# Patient Record
Sex: Female | Born: 1935 | Race: White | Hispanic: No | Smoking: Never smoker
Health system: Southern US, Community
[De-identification: ages and names within clinical notes are randomized; demographics above are authoritative.]

## PROBLEM LIST (undated history)

## (undated) DIAGNOSIS — N184 Chronic kidney disease, stage 4 (severe): Secondary | ICD-10-CM

## (undated) DIAGNOSIS — Z7901 Long term (current) use of anticoagulants: Secondary | ICD-10-CM

## (undated) DIAGNOSIS — I10 Essential (primary) hypertension: Secondary | ICD-10-CM

## (undated) DIAGNOSIS — R9430 Abnormal result of cardiovascular function study, unspecified: Secondary | ICD-10-CM

## (undated) DIAGNOSIS — G629 Polyneuropathy, unspecified: Secondary | ICD-10-CM

## (undated) DIAGNOSIS — G459 Transient cerebral ischemic attack, unspecified: Secondary | ICD-10-CM

## (undated) DIAGNOSIS — I639 Cerebral infarction, unspecified: Secondary | ICD-10-CM

## (undated) DIAGNOSIS — E785 Hyperlipidemia, unspecified: Secondary | ICD-10-CM

## (undated) DIAGNOSIS — Z794 Long term (current) use of insulin: Secondary | ICD-10-CM

## (undated) DIAGNOSIS — N200 Calculus of kidney: Secondary | ICD-10-CM

## (undated) DIAGNOSIS — IMO0001 Reserved for inherently not codable concepts without codable children: Secondary | ICD-10-CM

## (undated) DIAGNOSIS — E119 Type 2 diabetes mellitus without complications: Secondary | ICD-10-CM

## (undated) DIAGNOSIS — M1990 Unspecified osteoarthritis, unspecified site: Secondary | ICD-10-CM

## (undated) DIAGNOSIS — T4145XA Adverse effect of unspecified anesthetic, initial encounter: Secondary | ICD-10-CM

## (undated) DIAGNOSIS — R42 Dizziness and giddiness: Secondary | ICD-10-CM

## (undated) DIAGNOSIS — J449 Chronic obstructive pulmonary disease, unspecified: Secondary | ICD-10-CM

## (undated) DIAGNOSIS — N189 Chronic kidney disease, unspecified: Secondary | ICD-10-CM

## (undated) DIAGNOSIS — S98139A Complete traumatic amputation of one unspecified lesser toe, initial encounter: Secondary | ICD-10-CM

## (undated) DIAGNOSIS — T8859XA Other complications of anesthesia, initial encounter: Secondary | ICD-10-CM

## (undated) DIAGNOSIS — I509 Heart failure, unspecified: Secondary | ICD-10-CM

## (undated) DIAGNOSIS — I480 Paroxysmal atrial fibrillation: Secondary | ICD-10-CM

## (undated) DIAGNOSIS — K219 Gastro-esophageal reflux disease without esophagitis: Secondary | ICD-10-CM

## (undated) HISTORY — DX: Hyperlipidemia, unspecified: E78.5

## (undated) HISTORY — PX: TONSILLECTOMY: SUR1361

## (undated) HISTORY — DX: Complete traumatic amputation of one unspecified lesser toe, initial encounter: S98.139A

## (undated) HISTORY — DX: Long term (current) use of anticoagulants: Z79.01

## (undated) HISTORY — DX: Dizziness and giddiness: R42

## (undated) HISTORY — DX: Essential (primary) hypertension: I10

## (undated) HISTORY — DX: Polyneuropathy, unspecified: G62.9

## (undated) HISTORY — PX: CHOLECYSTECTOMY: SHX55

## (undated) HISTORY — PX: APPENDECTOMY: SHX54

## (undated) HISTORY — DX: Abnormal result of cardiovascular function study, unspecified: R94.30

## (undated) HISTORY — PX: CATARACT EXTRACTION: SUR2

## (undated) HISTORY — DX: Paroxysmal atrial fibrillation: I48.0

## (undated) HISTORY — DX: Cerebral infarction, unspecified: I63.9

---

## 2004-01-08 ENCOUNTER — Ambulatory Visit: Payer: Self-pay | Admitting: Infectious Diseases

## 2004-01-13 ENCOUNTER — Ambulatory Visit (HOSPITAL_COMMUNITY): Admission: RE | Admit: 2004-01-13 | Discharge: 2004-01-13 | Payer: Self-pay | Admitting: Infectious Diseases

## 2004-01-21 ENCOUNTER — Ambulatory Visit: Payer: Self-pay | Admitting: Infectious Diseases

## 2006-03-06 DIAGNOSIS — G459 Transient cerebral ischemic attack, unspecified: Secondary | ICD-10-CM

## 2006-03-06 HISTORY — DX: Transient cerebral ischemic attack, unspecified: G45.9

## 2007-03-07 HISTORY — PX: TOE AMPUTATION: SHX809

## 2007-08-09 ENCOUNTER — Encounter: Payer: Self-pay | Admitting: Cardiology

## 2007-09-12 ENCOUNTER — Ambulatory Visit: Payer: Self-pay | Admitting: Cardiology

## 2007-09-16 ENCOUNTER — Ambulatory Visit: Payer: Self-pay | Admitting: Cardiology

## 2007-09-19 ENCOUNTER — Ambulatory Visit: Payer: Self-pay | Admitting: Cardiology

## 2007-10-15 ENCOUNTER — Ambulatory Visit: Payer: Self-pay | Admitting: Cardiology

## 2007-10-18 ENCOUNTER — Ambulatory Visit: Payer: Self-pay | Admitting: Cardiology

## 2007-10-22 ENCOUNTER — Ambulatory Visit: Payer: Self-pay | Admitting: Cardiology

## 2007-10-29 ENCOUNTER — Ambulatory Visit: Payer: Self-pay | Admitting: Cardiology

## 2007-11-05 ENCOUNTER — Ambulatory Visit: Payer: Self-pay | Admitting: Cardiology

## 2007-11-21 ENCOUNTER — Ambulatory Visit: Payer: Self-pay | Admitting: Cardiology

## 2007-12-03 ENCOUNTER — Ambulatory Visit: Payer: Self-pay | Admitting: Cardiology

## 2007-12-31 ENCOUNTER — Ambulatory Visit: Payer: Self-pay | Admitting: Cardiology

## 2008-01-10 ENCOUNTER — Ambulatory Visit: Payer: Self-pay | Admitting: Cardiology

## 2008-01-28 ENCOUNTER — Ambulatory Visit: Payer: Self-pay | Admitting: Cardiology

## 2008-02-25 ENCOUNTER — Ambulatory Visit: Payer: Self-pay | Admitting: Cardiology

## 2008-03-24 ENCOUNTER — Ambulatory Visit: Payer: Self-pay | Admitting: Cardiology

## 2008-04-17 ENCOUNTER — Ambulatory Visit: Payer: Self-pay | Admitting: Cardiology

## 2008-05-08 ENCOUNTER — Ambulatory Visit: Payer: Self-pay | Admitting: Cardiology

## 2008-06-09 ENCOUNTER — Ambulatory Visit: Payer: Self-pay | Admitting: Cardiology

## 2008-06-29 ENCOUNTER — Encounter: Payer: Self-pay | Admitting: Cardiology

## 2008-07-07 ENCOUNTER — Ambulatory Visit: Payer: Self-pay | Admitting: Cardiology

## 2008-07-21 ENCOUNTER — Ambulatory Visit: Payer: Self-pay | Admitting: Cardiology

## 2008-08-04 ENCOUNTER — Ambulatory Visit: Payer: Self-pay | Admitting: Cardiology

## 2008-08-18 ENCOUNTER — Ambulatory Visit: Payer: Self-pay | Admitting: Cardiology

## 2008-09-08 ENCOUNTER — Ambulatory Visit: Payer: Self-pay | Admitting: Cardiology

## 2008-10-02 ENCOUNTER — Ambulatory Visit: Payer: Self-pay | Admitting: Cardiology

## 2008-10-19 ENCOUNTER — Encounter: Payer: Self-pay | Admitting: *Deleted

## 2008-10-29 ENCOUNTER — Encounter: Payer: Self-pay | Admitting: Cardiology

## 2008-10-30 ENCOUNTER — Ambulatory Visit: Payer: Self-pay | Admitting: Cardiology

## 2008-10-30 LAB — CONVERTED CEMR LAB
POC INR: 3
Prothrombin Time: 20.8 s

## 2008-11-11 ENCOUNTER — Encounter: Payer: Self-pay | Admitting: Cardiology

## 2008-11-11 ENCOUNTER — Telehealth (INDEPENDENT_AMBULATORY_CARE_PROVIDER_SITE_OTHER): Payer: Self-pay | Admitting: *Deleted

## 2008-11-27 ENCOUNTER — Ambulatory Visit: Payer: Self-pay | Admitting: Cardiology

## 2008-11-27 LAB — CONVERTED CEMR LAB: POC INR: 3.4

## 2008-12-25 ENCOUNTER — Ambulatory Visit: Payer: Self-pay | Admitting: Cardiology

## 2008-12-25 LAB — CONVERTED CEMR LAB: POC INR: 3.2

## 2009-01-06 ENCOUNTER — Encounter: Payer: Self-pay | Admitting: Cardiology

## 2009-01-08 ENCOUNTER — Encounter: Payer: Self-pay | Admitting: Cardiology

## 2009-01-22 ENCOUNTER — Ambulatory Visit: Payer: Self-pay | Admitting: Cardiology

## 2009-01-22 LAB — CONVERTED CEMR LAB: POC INR: 2.8

## 2009-02-21 ENCOUNTER — Encounter: Payer: Self-pay | Admitting: Cardiology

## 2009-02-23 ENCOUNTER — Ambulatory Visit: Payer: Self-pay | Admitting: Cardiology

## 2009-02-23 LAB — CONVERTED CEMR LAB: POC INR: 3.3

## 2009-03-23 ENCOUNTER — Ambulatory Visit: Payer: Self-pay | Admitting: Cardiology

## 2009-03-23 LAB — CONVERTED CEMR LAB: POC INR: 2.2

## 2009-04-20 ENCOUNTER — Ambulatory Visit: Payer: Self-pay | Admitting: Cardiology

## 2009-04-20 LAB — CONVERTED CEMR LAB: POC INR: 2.9

## 2009-05-18 ENCOUNTER — Ambulatory Visit: Payer: Self-pay | Admitting: Cardiology

## 2009-05-18 LAB — CONVERTED CEMR LAB: POC INR: 2.6

## 2009-06-15 ENCOUNTER — Ambulatory Visit: Payer: Self-pay | Admitting: Cardiology

## 2009-06-15 LAB — CONVERTED CEMR LAB: POC INR: 2.4

## 2009-07-13 ENCOUNTER — Ambulatory Visit: Payer: Self-pay | Admitting: Cardiology

## 2009-07-13 LAB — CONVERTED CEMR LAB: POC INR: 2.6

## 2009-08-10 ENCOUNTER — Ambulatory Visit: Payer: Self-pay | Admitting: Cardiology

## 2009-08-10 LAB — CONVERTED CEMR LAB: POC INR: 2

## 2009-09-14 ENCOUNTER — Ambulatory Visit: Payer: Self-pay | Admitting: Cardiology

## 2009-09-14 LAB — CONVERTED CEMR LAB: POC INR: 1.5

## 2009-10-05 ENCOUNTER — Ambulatory Visit: Payer: Self-pay | Admitting: Cardiology

## 2009-10-05 LAB — CONVERTED CEMR LAB: POC INR: 2

## 2009-11-02 ENCOUNTER — Ambulatory Visit: Payer: Self-pay | Admitting: Cardiology

## 2009-11-02 LAB — CONVERTED CEMR LAB: POC INR: 2.8

## 2009-11-30 ENCOUNTER — Ambulatory Visit: Payer: Self-pay | Admitting: Cardiology

## 2009-11-30 LAB — CONVERTED CEMR LAB: POC INR: 2.3

## 2009-12-28 ENCOUNTER — Ambulatory Visit: Payer: Self-pay | Admitting: Cardiology

## 2009-12-28 LAB — CONVERTED CEMR LAB: POC INR: 1.8

## 2010-01-25 ENCOUNTER — Ambulatory Visit: Payer: Self-pay | Admitting: Cardiology

## 2010-01-25 LAB — CONVERTED CEMR LAB: POC INR: 2.4

## 2010-02-07 ENCOUNTER — Ambulatory Visit: Payer: Self-pay | Admitting: Cardiology

## 2010-02-22 ENCOUNTER — Ambulatory Visit: Payer: Self-pay | Admitting: Cardiology

## 2010-02-22 LAB — CONVERTED CEMR LAB: POC INR: 3.3

## 2010-03-22 ENCOUNTER — Ambulatory Visit: Admit: 2010-03-22 | Payer: Self-pay

## 2010-04-01 ENCOUNTER — Ambulatory Visit: Admit: 2010-04-01 | Payer: Self-pay

## 2010-04-05 NOTE — Medication Information (Signed)
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 no    Recent/future dental: no  Any missed doses?: no       Is patient compliant with meds? yes       Allergies: No Known Drug Allergies  Anticoagulation Management History:      The patient is taking warfarin and comes in today for a routine follow up visit.  Positive risk factors for bleeding include an age of 74 years or older, history of CVA/TIA, and presence of serious comorbidities.  The bleeding index is 'high risk'.  Positive CHADS2 values include History of HTN, History of Diabetes, and Prior Stroke/CVA/TIA.  Negative CHADS2 values include Age > 74 years old.  The start date was 10/16/2007.  Anticoagulation responsible provider: Andee Lineman MD, Michelle Piper.  INR POC: 2.6.  Cuvette Lot#: [id number].  Exp: 07/11.    Anticoagulation Management Assessment/Plan:      The patient's current anticoagulation dose is Warfarin sodium 5 mg tabs: Use as directed by Anticoagulation Clinic..  The target INR is 2 - 3.  The next INR is due 06/15/2009.  Anticoagulation instructions were given to patient.  Results were reviewed/authorized by Vashti Hey, RN.  She was notified by Vashti Hey RN.         Prior Anticoagulation Instructions: INR 2.9 Continue coumadin 2.5mg  once daily except 5mg  on S,T,Th  Current Anticoagulation Instructions: INR 2.6 Continue coumadin 2.5mg  once daily except 5mg  on S, T,Th

## 2010-04-05 NOTE — Medication Information (Signed)
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 no    Recent/future dental: no  Any missed doses?: no       Is patient compliant with meds? yes       Allergies: No Known Drug Allergies  Anticoagulation Management History:      The patient is taking warfarin and comes in today for a routine follow up visit.  Positive risk factors for bleeding include an age of 40 years or older, history of CVA/TIA, and presence of serious comorbidities.  The bleeding index is 'high risk'.  Positive CHADS2 values include History of HTN, History of Diabetes, and Prior Stroke/CVA/TIA.  Negative CHADS2 values include Age > 60 years old.  The start date was 10/16/2007.  Anticoagulation responsible Terrie Grajales: Andee Lineman MD, Michelle Piper.  INR POC: 1.8.  Cuvette Lot#: [id number].  Exp: 07/11.    Anticoagulation Management Assessment/Plan:      The patient's current anticoagulation dose is Warfarin sodium 5 mg tabs: Use as directed by Anticoagulation Clinic..  The target INR is 2 - 3.  The next INR is due 01/25/2010.  Anticoagulation instructions were given to patient.  Results were reviewed/authorized by Vashti Hey, RN.  She was notified by Vashti Hey RN.         Prior Anticoagulation Instructions: INR 2.3 Continue coumadin 5mg  once daily except 2.5mg  on M,W,F  Current Anticoagulation Instructions: INR 1.8 Take coumadin 7.5mg  tonight then resume 5mg  once daily except 2.5mg  on M,W,F

## 2010-04-05 NOTE — Medication Information (Signed)
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 no    Recent/future dental: no  Any missed doses?: no       Is patient compliant with meds? yes       Allergies: No Known Drug Allergies  Anticoagulation Management History:      The patient is taking warfarin and comes in today for a routine follow up visit.  Positive risk factors for bleeding include an age of 74 years or older, history of CVA/TIA, and presence of serious comorbidities.  The bleeding index is 'high risk'.  Positive CHADS2 values include History of HTN, History of Diabetes, and Prior Stroke/CVA/TIA.  Negative CHADS2 values include Age > 48 years old.  The start date was 10/16/2007.  Anticoagulation responsible provider: Diona Browner MD, Remi Deter.  INR POC: 2.4.  Cuvette Lot#: [id number].  Exp: 07/11.    Anticoagulation Management Assessment/Plan:      The patient's current anticoagulation dose is Warfarin sodium 5 mg tabs: Use as directed by Anticoagulation Clinic..  The target INR is 2 - 3.  The next INR is due 02/22/2010.  Anticoagulation instructions were given to patient.  Results were reviewed/authorized by Vashti Hey, RN.  She was notified by Vashti Hey RN.         Prior Anticoagulation Instructions: INR 1.8 Take coumadin 7.5mg  tonight then resume 5mg  once daily except 2.5mg  on M,W,F  Current Anticoagulation Instructions: INR 2.4 Continue coumadin 5mg  once daily except 2.5mg  on M,W,F

## 2010-04-05 NOTE — Medication Information (Signed)
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 no    Recent/future dental: no  Any missed doses?: no       Is patient compliant with meds? yes       Allergies: No Known Drug Allergies  Anticoagulation Management History:      The patient is taking warfarin and comes in today for a routine follow up visit.  Positive risk factors for bleeding include an age of 74 years or older, history of CVA/TIA, and presence of serious comorbidities.  The bleeding index is 'high risk'.  Positive CHADS2 values include History of HTN, History of Diabetes, and Prior Stroke/CVA/TIA.  Negative CHADS2 values include Age > 74 years old.  The start date was 10/16/2007.  Anticoagulation responsible provider: Andee Lineman MD, Michelle Piper.  INR POC: 1.5.  Cuvette Lot#: [id number].  Exp: 07/11.    Anticoagulation Management Assessment/Plan:      The patient's current anticoagulation dose is Warfarin sodium 5 mg tabs: Use as directed by Anticoagulation Clinic..  The target INR is 2 - 3.  The next INR is due 10/05/2009.  Anticoagulation instructions were given to patient.  Results were reviewed/authorized by Vashti Hey, RN.  She was notified by Vashti Hey RN.         Prior Anticoagulation Instructions: INR 2.0 Continue coumadin 2.5mg  once daily except 5mg  on S,T,Th  Current Anticoagulation Instructions: INR 1.5 Take coumadin 7.5mg  tonight, 5mg  tomorrow night then resume 2.5mg  once daily except 5mg  on S,T,Th

## 2010-04-05 NOTE — Medication Information (Signed)
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 no    Recent/future dental: no  Any missed doses?: no       Is patient compliant with meds? yes       Allergies: No Known Drug Allergies  Anticoagulation Management History:      The patient is taking warfarin and comes in today for a routine follow up visit.  Positive risk factors for bleeding include an age of 55 years or older, history of CVA/TIA, and presence of serious comorbidities.  The bleeding index is 'high risk'.  Positive CHADS2 values include History of HTN, History of Diabetes, and Prior Stroke/CVA/TIA.  Negative CHADS2 values include Age > 48 years old.  The start date was 10/16/2007.  Anticoagulation responsible provider: Andee Lineman MD, Michelle Piper.  INR POC: 2.2.  Cuvette Lot#: [id number].  Exp: 07/11.    Anticoagulation Management Assessment/Plan:      The patient's current anticoagulation dose is Warfarin sodium 5 mg tabs: Use as directed by Anticoagulation Clinic..  The target INR is 2 - 3.  The next INR is due 04/20/2009.  Anticoagulation instructions were given to patient.  Results were reviewed/authorized by Vashti Hey, RN.  She was notified by Vashti Hey RN.         Prior Anticoagulation Instructions: INR 3.3 Take coumadin2.5mg  tonight then resume 2.5mg  once daily except 5mg  on S,T,Th  Current Anticoagulation Instructions: INR 2.2 Continue coumadin 2.5mg  once daily except 5mg  on S,T,Th

## 2010-04-05 NOTE — Medication Information (Signed)
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 no    Recent/future dental: no  Any missed doses?: no       Is patient compliant with meds? yes       Allergies: No Known Drug Allergies  Anticoagulation Management History:      The patient is taking warfarin and comes in today for a routine follow up visit.  Positive risk factors for bleeding include an age of 74 years or older, history of CVA/TIA, and presence of serious comorbidities.  The bleeding index is 'high risk'.  Positive CHADS2 values include History of HTN, History of Diabetes, and Prior Stroke/CVA/TIA.  Negative CHADS2 values include Age > 34 years old.  The start date was 10/16/2007.  Anticoagulation responsible provider: Diona Browner MD, Remi Deter.  INR POC: 2.0.  Cuvette Lot#: [id number].  Exp: 07/11.    Anticoagulation Management Assessment/Plan:      The patient's current anticoagulation dose is Warfarin sodium 5 mg tabs: Use as directed by Anticoagulation Clinic..  The target INR is 2 - 3.  The next INR is due 09/14/2009.  Anticoagulation instructions were given to patient.  Results were reviewed/authorized by Vashti Hey, RN.  She was notified by Vashti Hey RN.         Prior Anticoagulation Instructions: INR 2.6 Continue coumadin 2.5mg  once daily except 5mg  on S,T,Th  Current Anticoagulation Instructions: INR 2.0 Continue coumadin 2.5mg  once daily except 5mg  on S,T,Th

## 2010-04-05 NOTE — Medication Information (Signed)
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 no    Recent/future dental: no  Any missed doses?: no       Is patient compliant with meds? yes       Allergies: No Known Drug Allergies  Anticoagulation Management History:      The patient is taking warfarin and comes in today for a routine follow up visit.  Positive risk factors for bleeding include an age of 74 years or older, history of CVA/TIA, and presence of serious comorbidities.  The bleeding index is 'high risk'.  Positive CHADS2 values include History of HTN, History of Diabetes, and Prior Stroke/CVA/TIA.  Negative CHADS2 values include Age > 57 years old.  The start date was 10/16/2007.  Anticoagulation responsible provider: Andee Lineman MD, Michelle Piper.  INR POC: 2.8.  Cuvette Lot#: [id number].  Exp: 07/11.    Anticoagulation Management Assessment/Plan:      The patient's current anticoagulation dose is Warfarin sodium 5 mg tabs: Use as directed by Anticoagulation Clinic..  The target INR is 2 - 3.  The next INR is due 11/30/2009.  Anticoagulation instructions were given to patient.  Results were reviewed/authorized by Vashti Hey, RN.  She was notified by Vashti Hey RN.         Prior Anticoagulation Instructions: INR 2.0 Increase coumadin to 5mg  once daily except 2.5mg  on M,W,F  Current Anticoagulation Instructions: INR 2.8 Continue coumadin 5mg  once daily except 2.5mg  on M,W,F

## 2010-04-05 NOTE — Medication Information (Signed)
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 no    Recent/future dental: no  Any missed doses?: no       Is patient compliant with meds? yes       Allergies: No Known Drug Allergies  Anticoagulation Management History:      The patient is taking warfarin and comes in today for a routine follow up visit.  Positive risk factors for bleeding include an age of 51 years or older, history of CVA/TIA, and presence of serious comorbidities.  The bleeding index is 'high risk'.  Positive CHADS2 values include History of HTN, History of Diabetes, and Prior Stroke/CVA/TIA.  Negative CHADS2 values include Age > 20 years old.  The start date was 10/16/2007.  Anticoagulation responsible Dominick Morella: Andee Lineman MD, Michelle Piper.  INR POC: 2.3.  Cuvette Lot#: [id number].  Exp: 07/11.    Anticoagulation Management Assessment/Plan:      The patient's current anticoagulation dose is Warfarin sodium 5 mg tabs: Use as directed by Anticoagulation Clinic..  The target INR is 2 - 3.  The next INR is due 12/28/2009.  Anticoagulation instructions were given to patient.  Results were reviewed/authorized by Vashti Hey, RN.  She was notified by Vashti Hey RN.         Prior Anticoagulation Instructions: INR 2.8 Continue coumadin 5mg  once daily except 2.5mg  on M,W,F  Current Anticoagulation Instructions: INR 2.3 Continue coumadin 5mg  once daily except 2.5mg  on M,W,F

## 2010-04-05 NOTE — Medication Information (Signed)
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 Site: [facility] Care Clinic INR POC 2.6  Dietary changes: no    Health status changes: no    Bleeding/hemorrhagic complications: no    Recent/future hospitalizations: no    Any changes in medication regimen? no    Recent/future dental: no  Any missed doses?: no       Is patient compliant with meds? yes       Allergies: No Known Drug Allergies  Anticoagulation Management History:      The patient is taking warfarin and comes in today for a routine follow up visit.  Positive risk factors for bleeding include an age of 74 years or older, history of CVA/TIA, and presence of serious comorbidities.  The bleeding index is 'high risk'.  Positive CHADS2 values include History of HTN, History of Diabetes, and Prior Stroke/CVA/TIA.  Negative CHADS2 values include Age > 78 years old.  The start date was 10/16/2007.  Anticoagulation responsible provider: Diona Browner MD, Remi Deter.  INR POC: 2.6.  Cuvette Lot#: [id number].  Exp: 07/11.    Anticoagulation Management Assessment/Plan:      The patient's current anticoagulation dose is Warfarin sodium 5 mg tabs: Use as directed by Anticoagulation Clinic..  The target INR is 2 - 3.  The next INR is due 08/10/2009.  Anticoagulation instructions were given to patient.  Results were reviewed/authorized by Vashti Hey, RN.  She was notified by Vashti Hey RN.         Prior Anticoagulation Instructions: INR 2.4 Continue coumadin 2.5mg  once daily except 5mg  on S,T,Th  Current Anticoagulation Instructions: INR 2.6 Continue coumadin 2.5mg  once daily except 5mg  on S,T,Th

## 2010-04-05 NOTE — Medication Information (Signed)
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 no    Recent/future dental: no  Any missed doses?: no       Is patient compliant with meds? yes       Allergies: No Known Drug Allergies  Anticoagulation Management History:      The patient is taking warfarin and comes in today for a routine follow up visit.  Positive risk factors for bleeding include an age of 74 years or older, history of CVA/TIA, and presence of serious comorbidities.  The bleeding index is 'high risk'.  Positive CHADS2 values include History of HTN, History of Diabetes, and Prior Stroke/CVA/TIA.  Negative CHADS2 values include Age > 66 years old.  The start date was 10/16/2007.  Anticoagulation responsible provider: Andee Lineman MD, Michelle Piper.  INR POC: 2.0.  Cuvette Lot#: [id number].  Exp: 07/11.    Anticoagulation Management Assessment/Plan:      The patient's current anticoagulation dose is Warfarin sodium 5 mg tabs: Use as directed by Anticoagulation Clinic..  The target INR is 2 - 3.  The next INR is due 11/02/2009.  Anticoagulation instructions were given to patient.  Results were reviewed/authorized by Vashti Hey, RN.  She was notified by Vashti Hey RN.         Prior Anticoagulation Instructions: INR 1.5 Take coumadin 7.5mg  tonight, 5mg  tomorrow night then resume 2.5mg  once daily except 5mg  on S,T,Th   Current Anticoagulation Instructions: INR 2.0 Increase coumadin to 5mg  once daily except 2.5mg  on M,W,F

## 2010-04-05 NOTE — Medication Information (Signed)
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 Care Clinic INR POC 2.9  Dietary changes: no    Health status changes: no    Bleeding/hemorrhagic complications: no    Recent/future hospitalizations: no    Any changes in medication regimen? no    Recent/future dental: no  Any missed doses?: no       Is patient compliant with meds? yes       Allergies: No Known Drug Allergies  Anticoagulation Management History:      The patient is taking warfarin and comes in today for a routine follow up visit.  Positive risk factors for bleeding include an age of 74 years or older, history of CVA/TIA, and presence of serious comorbidities.  The bleeding index is 'high risk'.  Positive CHADS2 values include History of HTN, History of Diabetes, and Prior Stroke/CVA/TIA.  Negative CHADS2 values include Age > 74 years old.  The start date was 10/16/2007.  Anticoagulation responsible provider: Antoine Poche MD, Fayrene Fearing.  INR POC: 2.9.  Cuvette Lot#: [id number].  Exp: 07/11.    Anticoagulation Management Assessment/Plan:      The patient's current anticoagulation dose is Warfarin sodium 5 mg tabs: Use as directed by Anticoagulation Clinic..  The target INR is 2 - 3.  The next INR is due 05/18/2009.  Anticoagulation instructions were given to patient.  Results were reviewed/authorized by Vashti Hey, RN.  She was notified by Vashti Hey RN.         Prior Anticoagulation Instructions: INR 2.2 Continue coumadin 2.5mg  once daily except 5mg  on S,T,Th  Current Anticoagulation Instructions: INR 2.9 Continue coumadin 2.5mg  once daily except 5mg  on S,T,Th

## 2010-04-05 NOTE — Assessment & Plan Note (Signed)
Summary: 1 yr fu per dec reminder-srs   Visit Type:  Follow-up Primary Provider:  Fara Chute, MD  CC:  atrial fibrillation.  History of Present Illness: The patient is seen for followup of atrial fibrillation.  I saw her last December, 2010.  She's done well.  She had a CVA in 2009 with dysarthria.  This has improved completely.  She is on Coumadin and her levels followed very carefully.  She does not have palpitations.  She is on meds for rate control.  She has normal LV function.  She has no symptomatic palpitations.  No chest pain or shortness of breath.  Preventive Screening-Counseling & Management  Alcohol-Tobacco     Smoking Status: never  Current Medications (verified): 1)  Diltiazem Hcl Coated Beads 120 Mg Xr24h-Cap (Diltiazem Hcl Coated Beads) .... Take One Tab By Mouth Every Evening 2)  Warfarin Sodium 5 Mg Tabs (Warfarin Sodium) .... Use As Directed By Anticoagulation Clinic. 3)  Diltiazem Hcl Er Beads 240 Mg Xr24h-Cap (Diltiazem Hcl Er Beads) .... Take One Tab Every A.m. 4)  Glyburide 5 Mg Tabs (Glyburide) .... Take 2 By Mouth in Am and 1 By Mouth in Pm 5)  Simvastatin 20 Mg Tabs (Simvastatin) .... Take 1 Tablet At Bedtime 6)  Metformin Hcl 500 Mg Tabs (Metformin Hcl) .Marland Kitchen.. 1 Tab Every Morning and 1 1/2 Tab Every Evening 7)  Lisinopril-Hydrochlorothiazide 20-12.5 Mg Tabs (Lisinopril-Hydrochlorothiazide) .... 2 Tabs Every Morning  Allergies (verified): No Known Drug Allergies  Comments:  Nurse/Medical Assistant: The patient is currently on medications but does not know the name or dosage at this time. Instructed to contact our office with details. Will update medication list at that time.  Past History:  Past Medical History: Atrial fib...paroxysmal..Marland KitchenCHADS2 (4). coumadin Coumadin Rx EF  60-65%...echo.Marland KitchenMarland Kitchen7/2009 hypertension CVA....dysarthria...resolved...2009 chronic renal sufficiency dyslipidemia peripheral neuropathy diabetes mellitus Amputation two toes right  foot Cataract surgery  2011  Review of Systems       Patient denies fever, chills, headache, sweats, rash, , change in hearing, chest pain, cough, nausea vomiting, urinary symptoms. Cataract surgery went well and her vision is improved in the right.All of the systems are reviewed and are negative.  Vital Signs:  Patient profile:   76 year old female Height:      66 inches Weight:      175 pounds BMI:     28.35 Pulse rate:   85 / minute BP sitting:   156 / 73  (left arm) Cuff size:   regular  Vitals Entered By: Carlye Grippe (February 07, 2010 1:16 PM)  Nutrition Counseling: Patient's BMI is greater than 25 and therefore counseled on weight management options.  Physical Exam  General:  she looks great today. Eyes:  no xanthelasma. Neck:  no jugular distention. Lungs:  lungs are clear.  Respiratory effort is nonlabored. Heart:  cardiac exam reveals S1-S2.  No clicks or significant murmurs.  The rhythm is regular today. Abdomen:  abdomen soft. Extremities:  no peripheral edema. Psych:  patient is oriented to person time and place.  Affect is normal.   Impression & Recommendations:  Problem # 1:  DYSLIPIDEMIA (ICD-272.4)  Her updated medication list for this problem includes:    Simvastatin 20 Mg Tabs (Simvastatin) .Marland Kitchen... Take 1 tablet at bedtime The patient is on meds for her lipids.  This is followed by her primary physician.  Problem # 2:  CVA (ICD-434.91)  Her updated medication list for this problem includes:    Warfarin Sodium  5 Mg Tabs (Warfarin sodium) ..... Use as directed by anticoagulation clinic. With a history of CVA the patient will be kept on Coumadin or an equivalent indefinitely unless there is a contraindication.  I did describe Pradaxa to her.  Together we have agreed that she'll remain on Coumadin at this time.  Problem # 3:  HYPERTENSION (ICD-401.9)  Her updated medication list for this problem includes:    Diltiazem Hcl Coated Beads 120 Mg Xr24h-cap  (Diltiazem hcl coated beads) .Marland Kitchen... Take one tab by mouth every evening    Diltiazem Hcl Er Beads 240 Mg Xr24h-cap (Diltiazem hcl er beads) .Marland Kitchen... Take one tab every a.m.    Lisinopril-hydrochlorothiazide 20-12.5 Mg Tabs (Lisinopril-hydrochlorothiazide) .Marland Kitchen... 2 tabs every morning Blood pressure is adequately controlled today.  No change in therapy. Her updated medication list for this problem includes:    Diltiazem Hcl Coated Beads 120 Mg Xr24h-cap (Diltiazem hcl coated beads) .Marland Kitchen... Take one tab by mouth every evening    Diltiazem Hcl Er Beads 240 Mg Xr24h-cap (Diltiazem hcl er beads) .Marland Kitchen... Take one tab every a.m.    Lisinopril-hydrochlorothiazide 20-12.5 Mg Tabs (Lisinopril-hydrochlorothiazide) .Marland Kitchen... 2 tabs every morning Blood pressure is well controlled today.  No change in therapy.  Problem # 4:  ATRIAL FIBRILLATION, PAROXYSMAL (ICD-427.31)  Her updated medication list for this problem includes:    Warfarin Sodium 5 Mg Tabs (Warfarin sodium) ..... Use as directed by anticoagulation clinic.  Orders: EKG w/ Interpretation (93000) EKG done today and reviewed by me.  They're sinus rhythm today.  There is no change in the QRS. No change in therapy.  See her back in one year.  Patient Instructions: 1)  Your physician wants you to follow-up in: 1 year. You will receive a reminder letter in the mail one-two months in advance. If you don't receive a letter, please call our office to schedule the follow-up appointment. 2)  Your physician recommends that you continue on your current medications as directed. Please refer to the Current Medication list given to you today.

## 2010-04-05 NOTE — Medication Information (Signed)
Summary: ccr-lr  Anticoagulant Therapy  Managed by: Vashti Hey, RN PCP: Fara Chute, MD Supervising MD: Diona Browner MD, Remi Deter Indication 1: Atrial Fibrillation (ICD-427.31) Indication 2: CVA--stroke (ICD-436) [facility] [place] Site: [facility] Care Clinic INR POC 2.4  Dietary changes: no    Health status changes: yes       Details: Had recent fall   Went to ED  Xrays OK  Bleeding/hemorrhagic complications: no    Recent/future hospitalizations: no    Any changes in medication regimen? no    Recent/future dental: no  Any missed doses?: no       Is patient compliant with meds? yes       Allergies: No Known Drug Allergies  Anticoagulation Management History:      The patient is taking warfarin and comes in today for a routine follow up visit.  Positive risk factors for bleeding include an age of 74 years or older, history of CVA/TIA, and presence of serious comorbidities.  The bleeding index is 'high risk'.  Positive CHADS2 values include History of HTN, History of Diabetes, and Prior Stroke/CVA/TIA.  Negative CHADS2 values include Age > 74 years old.  The start date was 10/16/2007.  Anticoagulation responsible provider: Diona Browner MD, Remi Deter.  INR POC: 2.4.  Cuvette Lot#: [id number].  Exp: 07/11.    Anticoagulation Management Assessment/Plan:      The patient's current anticoagulation dose is Warfarin sodium 5 mg tabs: Use as directed by Anticoagulation Clinic..  The target INR is 2 - 3.  The next INR is due 07/13/2009.  Anticoagulation instructions were given to patient.  Results were reviewed/authorized by Vashti Hey, RN.  She was notified by Vashti Hey RN.         Prior Anticoagulation Instructions: INR 2.6 Continue coumadin 2.5mg  once daily except 5mg  on S, T,Th  Current Anticoagulation Instructions: INR 2.4 Continue coumadin 2.5mg  once daily except 5mg  on S,T,Th

## 2010-04-07 NOTE — Medication Information (Signed)
Summary: ccr-lr  Anticoagulant Therapy  Managed by: Vashti Hey, RN PCP: Fara Chute, MD Supervising MD: Andee Lineman MD, Michelle Piper Indication 1: Atrial Fibrillation (ICD-427.31) Indication 2: CVA--stroke (ICD-436) Lab [facility] [place] Site: [facility] of Care Clinic INR POC 3.3  Dietary changes: no    Health status changes: no    Bleeding/hemorrhagic complications: no    Recent/future hospitalizations: no    Any changes in medication regimen? no    Recent/future dental: no  Any missed doses?: no       Is patient compliant with meds? yes       Allergies: No Known Drug Allergies  Anticoagulation Management History:      The patient is taking warfarin and comes in today for a routine follow up visit.  Positive risk factors for bleeding include an age of 4 years or older, history of CVA/TIA, and presence of serious comorbidities.  The bleeding index is 'high risk'.  Positive CHADS2 values include History of HTN, History of Diabetes, and Prior Stroke/CVA/TIA.  Negative CHADS2 values include Age > 54 years old.  The start date was 10/16/2007.  Anticoagulation responsible Rakesh Dutko: Andee Lineman MD, Michelle Piper.  INR POC: 3.3.  Cuvette Lot#: [id number].  Exp: 07/11.    Anticoagulation Management Assessment/Plan:      The patient's current anticoagulation dose is Warfarin sodium 5 mg tabs: Use as directed by Anticoagulation Clinic..  The target INR is 2 - 3.  The next INR is due 03/22/2010.  Anticoagulation instructions were given to patient.  Results were reviewed/authorized by Vashti Hey, RN.  She was notified by Vashti Hey RN.         Prior Anticoagulation Instructions: INR 2.4 Continue coumadin 5mg  once daily except 2.5mg  on M,W,F  Current Anticoagulation Instructions: INR 3.3 Take coumadin 2.5mg  tonight then resume 5mg  once daily except 2.5mg  on M,W,F

## 2010-04-26 ENCOUNTER — Encounter: Payer: Self-pay | Admitting: Cardiology

## 2010-04-26 ENCOUNTER — Encounter (INDEPENDENT_AMBULATORY_CARE_PROVIDER_SITE_OTHER): Payer: Medicare Other

## 2010-04-26 DIAGNOSIS — Z7901 Long term (current) use of anticoagulants: Secondary | ICD-10-CM

## 2010-04-26 DIAGNOSIS — I4891 Unspecified atrial fibrillation: Secondary | ICD-10-CM

## 2010-04-26 DIAGNOSIS — I6789 Other cerebrovascular disease: Secondary | ICD-10-CM

## 2010-04-26 LAB — CONVERTED CEMR LAB: POC INR: 4.7

## 2010-05-03 NOTE — Medication Information (Signed)
Summary: COUMADIN  Anticoagulant Therapy  Managed by: Vashti Hey, RN PCP: Fara Chute, MD Supervising MD: Andee Lineman MD, Michelle Piper Indication 1: Atrial Fibrillation (ICD-427.31) Indication 2: CVA--stroke (ICD-436) Lab Used: [facility] [place] Site: [facility] Care Clinic INR POC 4.7  Dietary changes: no    Health status changes: no    Bleeding/hemorrhagic complications: no    Recent/future hospitalizations: no    Any changes in medication regimen? yes       Details: On z-pack for bronchitis  started yesterday  Recent/future dental: no  Any missed doses?: no       Is patient compliant with meds? yes       Allergies: No Known Drug Allergies  Anticoagulation Management History:      The patient is taking warfarin and comes in today for a routine follow up visit.  Positive risk factors for bleeding include an age of 27 years or older, history of CVA/TIA, and presence of serious comorbidities.  The bleeding index is 'high risk'.  Positive CHADS2 values include History of HTN, History of Diabetes, and Prior Stroke/CVA/TIA.  Negative CHADS2 values include Age > 66 years old.  The start date was 10/16/2007.  Anticoagulation responsible provider: Andee Lineman MD, Michelle Piper.  INR POC: 4.7.  Cuvette Lot#: [id number].  Exp: 07/11.    Anticoagulation Management Assessment/Plan:      The patient's current anticoagulation dose is Warfarin sodium 5 mg tabs: Use as directed by Anticoagulation Clinic..  The target INR is 2 - 3.  The next INR is due 05/10/2010.  Anticoagulation instructions were given to patient.  Results were reviewed/authorized by Vashti Hey, RN.  She was notified by Vashti Hey RN.         Prior Anticoagulation Instructions: INR 3.3 Take coumadin 2.5mg  tonight then resume 5mg  once daily except 2.5mg  on M,W,F  Current Anticoagulation Instructions: INR 4.7 On Z-pak  Finishes 04/29/10 Hold coumadin x 2, take 2.5mg  on Thursday, then resume 5mg  once daily except 2.5mg  on M,W,F

## 2010-05-10 ENCOUNTER — Encounter: Payer: Self-pay | Admitting: Cardiology

## 2010-05-10 ENCOUNTER — Encounter (INDEPENDENT_AMBULATORY_CARE_PROVIDER_SITE_OTHER): Payer: Medicare Other

## 2010-05-10 DIAGNOSIS — Z7901 Long term (current) use of anticoagulants: Secondary | ICD-10-CM

## 2010-05-10 DIAGNOSIS — I4891 Unspecified atrial fibrillation: Secondary | ICD-10-CM

## 2010-05-10 LAB — CONVERTED CEMR LAB: POC INR: 3

## 2010-05-17 NOTE — Medication Information (Signed)
Summary: ccr-lr  Anticoagulant Therapy  Managed by: Vashti Hey, RN PCP: Fara Chute, MD Supervising MD: Andee Lineman MD, Michelle Piper Indication 1: Atrial Fibrillation (ICD-427.31) Indication 2: CVA--stroke (ICD-436) Lab Used: [facility] [place] Site: [facility] Clinic INR POC 3.0  Dietary changes: no    Health status changes: no    Bleeding/hemorrhagic complications: no    Recent/future hospitalizations: no    Any changes in medication regimen? no    Recent/future dental: no  Any missed doses?: no       Is patient compliant with meds? yes       Allergies: No Known Drug Allergies  Anticoagulation Management History:      The patient is taking warfarin and comes in today for a routine follow up visit.  Positive risk factors for bleeding include an age of 22 years or older, history of CVA/TIA, and presence of serious comorbidities.  The bleeding index is 'high risk'.  Positive CHADS2 values include History of HTN, History of Diabetes, and Prior Stroke/CVA/TIA.  Negative CHADS2 values include Age > 38 years old.  The start date was 10/16/2007.  Anticoagulation responsible provider: Andee Lineman MD, Michelle Piper.  INR POC: 3.0.  Cuvette Lot#: [id number].  Exp: 07/11.    Anticoagulation Management Assessment/Plan:      The patient's current anticoagulation dose is Warfarin sodium 5 mg tabs: Use as directed by Anticoagulation Clinic..  The target INR is 2 - 3.  The next INR is due 06/07/2010.  Anticoagulation instructions were given to patient.  Results were reviewed/authorized by Vashti Hey, RN.  She was notified by Vashti Hey RN.         Prior Anticoagulation Instructions: INR 4.7 On Z-pak  Finishes 04/29/10 Hold coumadin x 2, take 2.5mg  on Thursday, then resume 5mg  once daily except 2.5mg  on M,W,F  Current Anticoagulation Instructions: INR 3.0 Decrease dose to 2.5mg  once daily except 5mg  on M,W,F

## 2010-05-30 ENCOUNTER — Encounter: Payer: Self-pay | Admitting: Cardiology

## 2010-05-30 DIAGNOSIS — I6350 Cerebral infarction due to unspecified occlusion or stenosis of unspecified cerebral artery: Secondary | ICD-10-CM

## 2010-05-30 DIAGNOSIS — Z7901 Long term (current) use of anticoagulants: Secondary | ICD-10-CM

## 2010-05-30 DIAGNOSIS — I4891 Unspecified atrial fibrillation: Secondary | ICD-10-CM

## 2010-06-07 ENCOUNTER — Ambulatory Visit (INDEPENDENT_AMBULATORY_CARE_PROVIDER_SITE_OTHER): Payer: Medicare Other | Admitting: *Deleted

## 2010-06-07 DIAGNOSIS — I4891 Unspecified atrial fibrillation: Secondary | ICD-10-CM

## 2010-06-07 DIAGNOSIS — Z7901 Long term (current) use of anticoagulants: Secondary | ICD-10-CM

## 2010-06-07 DIAGNOSIS — I6350 Cerebral infarction due to unspecified occlusion or stenosis of unspecified cerebral artery: Secondary | ICD-10-CM

## 2010-06-07 LAB — POCT INR: INR: 1.6

## 2010-06-28 ENCOUNTER — Ambulatory Visit (INDEPENDENT_AMBULATORY_CARE_PROVIDER_SITE_OTHER): Payer: Medicare Other | Admitting: *Deleted

## 2010-06-28 DIAGNOSIS — I6350 Cerebral infarction due to unspecified occlusion or stenosis of unspecified cerebral artery: Secondary | ICD-10-CM

## 2010-06-28 DIAGNOSIS — Z7901 Long term (current) use of anticoagulants: Secondary | ICD-10-CM

## 2010-06-28 DIAGNOSIS — I4891 Unspecified atrial fibrillation: Secondary | ICD-10-CM

## 2010-06-28 LAB — POCT INR: INR: 2.7

## 2010-07-14 ENCOUNTER — Other Ambulatory Visit: Payer: Self-pay | Admitting: Cardiology

## 2010-07-19 NOTE — Assessment & Plan Note (Signed)
[facility]                          [facility] NOTE   NAME:[name], [name]                      MRN:          [id number]  DATE:04/17/2008                            DOB:          09-11-1934    REASON FOR VISIT:  Scheduled followup.   [name] continues to do well clinically, with no interim  development of exertional angina pectoris, tachy palpitations, or  significant dyspnea.  When last seen in the clinic, we strongly  recommended that she initiate Coumadin anticoagulation, given the  elevated [place] score, and she has since being closely followed here in  our Coumadin Clinic.  We subsequently took her off aspirin.   CURRENT MEDICATIONS:  1. Coumadin 5 mg, as directed.  2. Glyburide 10 mg q.a.m./5 mg q.p.m.  3. Simvastatin 20 nightly.  4. Metformin 850 b.i.d.  5. Diltiazem XR 240 daily.  6. Lisinopril 40 daily.  7. Hydrochlorothiazide 12.5 daily.   PHYSICAL EXAMINATION:  VITAL SIGNS:  Blood pressure 160/73 initially,  190/66 per my followup.  Pulse 98, regular, and weight 186 (up 3).  GENERAL:  An 76 year old female sitting upright in no distress.  HEENT:  Normocephalic, atraumatic.  NECK:  Palpable bilateral carotid pulse without bruits; no JVD.  LUNGS:  Clear to auscultation in all fields.  HEART:  Regular rhythm.  No significant murmurs.  ABDOMEN:  Benign.  EXTREMITIES:  Trace edema.  NEUROLOGIC:  No focal deficit.   IMPRESSION:  1. Paroxysmal atrial fibrillation.      a.     CHAD2:  4.      b.     Chronic Coumadin.  2. Normal left ventricular function.  3. Status post stroke.  4. Hypertension, uncontrolled.  5. Type 2 diabetes mellitus.  6. Dyslipidemia.  7. Chronic renal insufficiency.   PLAN:  1. Uptitrate diltiazem by adding a dose of 120 mg to be taken every      evening, for a total of 360 mg daily.  This is for more aggressive      blood pressure control, as well as to ameliorate her elevated basal  heart rate.  2. Follow up blood pressure/pulse check with our staff RN, here in the      office in 2 weeks.  Further recommendations regarding her      medications can be made at that time, pending review of her blood      pressure/ pulse check.  3. Schedule return clinic follow up with myself and Dr. Myrtis [name] in 6      months.      Rozell Searing, PA-C  Electronically Signed      Luis Abed, MD, [facility]  Electronically Signed   GS/MedQ  DD: 04/17/2008  DT: 04/18/2008  Job #: [id number]   cc:   Fara Chute

## 2010-07-19 NOTE — Assessment & Plan Note (Signed)
[facility]                          [facility] NOTE   NAME:[name], [name]                        MRN:          [id number]  DATE:09/12/2007                            DOB:          1934-05-10    [name] had been hospitalized at [facility] and she was admitted on  August 09, 2007, and discharged on August 11, 2007.  She had some type of  cerebrovascular event with dysarthria.  During the hospitalization, she  had a head CT and an MRI that showed no significant abnormality.  Therefore, it is thought that her neurologic event must have been  related to the watershed area.  Her symptoms seemed very likely to be  neurologic.  She had dysarthria that improved in approximately 24 hours  and she went home symptom free.   There is question that she had atrial fibrillation.  There are several  EKGs that are copied from the hospital chart.  She has sinus rhythm on  the EKGs.  One of them is read as atrial fibrillation.  However, with  careful review, I have believe that this is probably sinus rhythm with  PACs.  Therefore, the information available to me today does not show  any definite atrial fibrillation.  This of course is an extremely  important issue for this lady at this time.  She is not having any chest  pain.  She has no shortness of breath.  She goes about full activities.  She is diabetic.   PAST MEDICAL HISTORY:   ALLERGIES:  No known drug allergies.   MEDICATIONS:  1. Hydrochlorothiazide 12.5.  2. Aspirin 81.  3. Glyburide 10.  4. Lisinopril 40.  5. Amitriptyline 100.  6. Diltiazem XR 240.  7. Metformin 850 b.i.d.  8. Simvastatin 20.   OTHER MEDICAL PROBLEMS:  See the list below.   SOCIAL HISTORY:  The patient is retired.  She has never smoked.   FAMILY HISTORY:  There is a family history of coronary artery disease.   REVIEW OF SYSTEMS:  Today, she feels fine.  She has had some seasonal  allergies.  Otherwise, her review of  systems is negative.   PHYSICAL EXAMINATION:  VITAL SIGNS:  Blood pressure today is 170/81 with  a pulse of 74.  Weight is 183 pounds.  The patient will need careful  blood pressure followup.  HEENT:  No xanthelasma.  She has normal extraocular motion.  There are  no carotid bruits.  There is no jugular venous distention.  LUNGS:  Clear.  Respiratory effort is not labored.  CARDIAC:  An S1 with an S2.  There are no clicks or significant murmurs.  ABDOMEN:  Soft.  There are no masses or bruits.  There is no peripheral  edema.   EKG today reveals sinus rhythm with nonspecific ST-T wave abnormalities.   PROBLEMS:  1. Diabetes.  2. 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  3. Status post appendectomy and cholecystectomy.  4. Removal of a vaginal cyst.  5. Elevated cholesterol.  6. Mild renal disease.  7. Hypertension.  8. Transient ischemic attack* with dysarthria that improved completely      while in the hospital with a negative CT scan and a negative MRI.  9. Question of atrial fibrillation.   As noted in the first paragraph of the HPI, although there was question  of atrial fibrillation, I am not completely sure that it has been  completely documented.  With this in mind, I am hesitant to start  Coumadin.  She would otherwise be a candidate for Coumadin.  I decided  to proceed with 2-D echo to assess left ventricular function and  valvular function and have her wear a 48-hour Holter monitor to see if  we see any suggestion of intermittent atrial fibrillation.  I will then  see her for followup.     Luis Abed, MD, [facility]  Electronically Signed    JDK/MedQ  DD: 09/12/2007  DT: 09/13/2007  Job #: [id number]   cc:   Fara Chute

## 2010-07-19 NOTE — Assessment & Plan Note (Signed)
[facility]                          [facility]   NAME:FERGUSONPetra, [name]                      MRN:          [id number]  DATE:10/15/2007                            DOB:          [birth year]    PRIMARY CARDIOLOGIST:  Dr. Luis Abed, MD, [facility]   REASON FOR VISIT:  Scheduled followup.  Please refer to Dr. Celedonio Savage recent consultation note, for full details.   [name] returns to the clinic in followup of her recent  consultation for question of possible, new onset paroxysmal atrial  fibrillation.  This was noted recently here at [facility], when  the patient presented with a stroke.  Although Dr. Myrtis Ser felt that the  EKG's did not reveal any definitive evidence of atrial fibrillation, he  did recommend further evaluation with a 48-hour Holter monitor.  Additionally, a 2-D echo was performed for assessment of LV function.   The 2-D echo indicates normal LVF (EF 60-65%) with mild LVH and no focal  wall motion abnormalities.   A 48-hour Holter monitor did yield one transient episode of SVT, of less  than 10 beats in duration, with rates in the 130-150 bpm range.  There  were also isolated PVCs, but no ventricular dysrhythmia.   Clinically, [name] denies any interim development of chest pain,  exertional dyspnea, tachy palpitations, or near-syncope.  She states  that her dysarthria, which was her presenting symptom with her stroke,  has completely resolved.  She also denies any prior history of a stroke.   [name] has a CHADS2 score of 4, secondary to hypertension,  diabetes mellitus, and recent neurologic event.   CURRENT MEDICATIONS:  1. Aspirin 81 daily.  2. HCTZ 12.5 daily.  3. Glyburide 10 daily.  4. Lisinopril 40 daily.  5. Amitriptyline 100 nightly.  6. Diltiazem XR 240 daily.  7. Metformin 850 b.i.d.  8. Simvastatin 20 nightly.   PHYSICAL EXAMINATION:  VITAL SIGNS:  Blood pressure 116/74,  pulse 76,  and regular weight 183.  GENERAL:  A 76 year old female sitting upright, no distress.  HEENT:  Normocephalic and atraumatic.  NECK:  Palpable carotid pulse without bruits.  LUNGS:  Clear to auscultation in lung fields.  HEART:  RRR (S1S2), no significant murmurs.  ABDOMEN:  Soft, nontender.  EXTREMITIES:  Trace of edema.  NEURO:  No focal deficit.   IMPRESSION:  1. Transient, new onset paroxysmal atrial fibrillation.      a.     Documented by recent 48-hour Holter monitor.  2. Normal left ventricular function.  3. Status post recent stroke.      a.     Associated dysarthria, since resolved, with negative head CT       and brain MRI.  4. Type 2 diabetes mellitus.  5. Hypertension.  6. Dyslipidemia.  7. Chronic renal insufficiency.      a.     Microalbuminuria.   PLAN:  Following consultation with Dr. Myrtis Ser, recommendation is to  initiate Coumadin anticoagulation, given the patient's increased risk of  stroke.  Although there was only one episode of  definite SVT on current  Holter monitoring, this brief run is suggestive of possible PAF, given  the irregular R-R intervals.  In light of her recent neurologic event,  and her elevated CHADS2 score, therefore, we feel that the benefit of  Coumadin anticoagulation does outweigh the risk.  This was reviewed with  the patient, who has elected to proceed.  We will therefore establish  her in our Coumadin Clinic and have her return for an early followup  protime.  Once she has documented therapeutic INR levels, she can then  come off aspirin.  She has no known history of coronary artery disease.  Of note, we will also check her blood pressure when she returns to the  Coumadin Clinic.  Her reading today is elevated and we will need to  monitor this closely, as well.      Rozell Searing, PA-C  Electronically Signed      Luis Abed, MD, [facility]  Electronically Signed   GS/MedQ  DD: 10/15/2007  DT: 10/16/2007  Job #:  [id number]   cc:   Fara Chute

## 2010-07-26 ENCOUNTER — Ambulatory Visit (INDEPENDENT_AMBULATORY_CARE_PROVIDER_SITE_OTHER): Payer: Medicare Other | Admitting: *Deleted

## 2010-07-26 DIAGNOSIS — I4891 Unspecified atrial fibrillation: Secondary | ICD-10-CM

## 2010-07-26 DIAGNOSIS — I6350 Cerebral infarction due to unspecified occlusion or stenosis of unspecified cerebral artery: Secondary | ICD-10-CM

## 2010-07-26 DIAGNOSIS — Z7901 Long term (current) use of anticoagulants: Secondary | ICD-10-CM

## 2010-07-26 LAB — POCT INR: INR: 2.7

## 2010-08-23 ENCOUNTER — Ambulatory Visit (INDEPENDENT_AMBULATORY_CARE_PROVIDER_SITE_OTHER): Payer: Medicare Other | Admitting: *Deleted

## 2010-08-23 DIAGNOSIS — I6350 Cerebral infarction due to unspecified occlusion or stenosis of unspecified cerebral artery: Secondary | ICD-10-CM

## 2010-08-23 DIAGNOSIS — I4891 Unspecified atrial fibrillation: Secondary | ICD-10-CM

## 2010-08-23 DIAGNOSIS — Z7901 Long term (current) use of anticoagulants: Secondary | ICD-10-CM

## 2010-08-23 LAB — POCT INR: INR: 2.9

## 2010-09-08 ENCOUNTER — Encounter: Payer: Self-pay | Admitting: Cardiology

## 2010-09-20 ENCOUNTER — Ambulatory Visit (INDEPENDENT_AMBULATORY_CARE_PROVIDER_SITE_OTHER): Payer: Medicare Other | Admitting: *Deleted

## 2010-09-20 ENCOUNTER — Encounter: Payer: Medicare Other | Admitting: *Deleted

## 2010-09-20 DIAGNOSIS — Z7901 Long term (current) use of anticoagulants: Secondary | ICD-10-CM

## 2010-09-20 DIAGNOSIS — I4891 Unspecified atrial fibrillation: Secondary | ICD-10-CM

## 2010-09-20 DIAGNOSIS — I6350 Cerebral infarction due to unspecified occlusion or stenosis of unspecified cerebral artery: Secondary | ICD-10-CM

## 2010-10-18 ENCOUNTER — Ambulatory Visit (INDEPENDENT_AMBULATORY_CARE_PROVIDER_SITE_OTHER): Payer: Medicare Other | Admitting: *Deleted

## 2010-10-18 DIAGNOSIS — I6350 Cerebral infarction due to unspecified occlusion or stenosis of unspecified cerebral artery: Secondary | ICD-10-CM

## 2010-10-18 DIAGNOSIS — I4891 Unspecified atrial fibrillation: Secondary | ICD-10-CM

## 2010-10-18 DIAGNOSIS — Z7901 Long term (current) use of anticoagulants: Secondary | ICD-10-CM

## 2010-10-18 LAB — POCT INR: INR: 2.6

## 2010-11-15 ENCOUNTER — Ambulatory Visit (INDEPENDENT_AMBULATORY_CARE_PROVIDER_SITE_OTHER): Payer: Medicare Other | Admitting: *Deleted

## 2010-11-15 DIAGNOSIS — I4891 Unspecified atrial fibrillation: Secondary | ICD-10-CM

## 2010-11-15 DIAGNOSIS — Z7901 Long term (current) use of anticoagulants: Secondary | ICD-10-CM

## 2010-11-15 DIAGNOSIS — I6350 Cerebral infarction due to unspecified occlusion or stenosis of unspecified cerebral artery: Secondary | ICD-10-CM

## 2010-11-15 LAB — POCT INR: INR: 2.7

## 2010-12-13 ENCOUNTER — Ambulatory Visit (INDEPENDENT_AMBULATORY_CARE_PROVIDER_SITE_OTHER): Payer: Medicare Other | Admitting: *Deleted

## 2010-12-13 DIAGNOSIS — I4891 Unspecified atrial fibrillation: Secondary | ICD-10-CM

## 2010-12-13 DIAGNOSIS — I6350 Cerebral infarction due to unspecified occlusion or stenosis of unspecified cerebral artery: Secondary | ICD-10-CM

## 2010-12-13 DIAGNOSIS — Z7901 Long term (current) use of anticoagulants: Secondary | ICD-10-CM

## 2010-12-13 LAB — POCT INR: INR: 3

## 2011-01-04 ENCOUNTER — Other Ambulatory Visit: Payer: Self-pay | Admitting: Cardiology

## 2011-01-10 ENCOUNTER — Ambulatory Visit (INDEPENDENT_AMBULATORY_CARE_PROVIDER_SITE_OTHER): Payer: Medicare Other | Admitting: *Deleted

## 2011-01-10 DIAGNOSIS — I4891 Unspecified atrial fibrillation: Secondary | ICD-10-CM

## 2011-01-10 DIAGNOSIS — I6350 Cerebral infarction due to unspecified occlusion or stenosis of unspecified cerebral artery: Secondary | ICD-10-CM

## 2011-01-10 DIAGNOSIS — Z7901 Long term (current) use of anticoagulants: Secondary | ICD-10-CM

## 2011-01-10 LAB — POCT INR: INR: 2.9

## 2011-02-09 ENCOUNTER — Encounter: Payer: Self-pay | Admitting: *Deleted

## 2011-02-13 ENCOUNTER — Ambulatory Visit: Payer: Medicare Other | Admitting: Cardiology

## 2011-02-21 ENCOUNTER — Ambulatory Visit (INDEPENDENT_AMBULATORY_CARE_PROVIDER_SITE_OTHER): Payer: Medicare Other | Admitting: *Deleted

## 2011-02-21 DIAGNOSIS — I6350 Cerebral infarction due to unspecified occlusion or stenosis of unspecified cerebral artery: Secondary | ICD-10-CM

## 2011-02-21 DIAGNOSIS — Z7901 Long term (current) use of anticoagulants: Secondary | ICD-10-CM

## 2011-02-21 DIAGNOSIS — I4891 Unspecified atrial fibrillation: Secondary | ICD-10-CM

## 2011-02-21 LAB — POCT INR: INR: 2.6

## 2011-03-15 ENCOUNTER — Ambulatory Visit: Payer: Medicare Other | Admitting: Cardiology

## 2011-03-28 ENCOUNTER — Encounter: Payer: Self-pay | Admitting: Cardiology

## 2011-03-28 DIAGNOSIS — E119 Type 2 diabetes mellitus without complications: Secondary | ICD-10-CM | POA: Insufficient documentation

## 2011-03-28 DIAGNOSIS — Z7901 Long term (current) use of anticoagulants: Secondary | ICD-10-CM | POA: Insufficient documentation

## 2011-03-28 DIAGNOSIS — IMO0002 Reserved for concepts with insufficient information to code with codable children: Secondary | ICD-10-CM | POA: Insufficient documentation

## 2011-03-28 DIAGNOSIS — G629 Polyneuropathy, unspecified: Secondary | ICD-10-CM | POA: Insufficient documentation

## 2011-03-28 DIAGNOSIS — R9430 Abnormal result of cardiovascular function study, unspecified: Secondary | ICD-10-CM | POA: Insufficient documentation

## 2011-03-28 DIAGNOSIS — I10 Essential (primary) hypertension: Secondary | ICD-10-CM | POA: Insufficient documentation

## 2011-03-28 DIAGNOSIS — E785 Hyperlipidemia, unspecified: Secondary | ICD-10-CM | POA: Insufficient documentation

## 2011-03-28 DIAGNOSIS — I639 Cerebral infarction, unspecified: Secondary | ICD-10-CM | POA: Insufficient documentation

## 2011-03-28 DIAGNOSIS — S98139A Complete traumatic amputation of one unspecified lesser toe, initial encounter: Secondary | ICD-10-CM | POA: Insufficient documentation

## 2011-03-29 ENCOUNTER — Ambulatory Visit (INDEPENDENT_AMBULATORY_CARE_PROVIDER_SITE_OTHER): Payer: Medicare Other | Admitting: Cardiology

## 2011-03-29 ENCOUNTER — Encounter: Payer: Self-pay | Admitting: Cardiology

## 2011-03-29 VITALS — BP 160/69 | HR 92 | Ht 66.0 in | Wt 175.0 lb

## 2011-03-29 DIAGNOSIS — Z7901 Long term (current) use of anticoagulants: Secondary | ICD-10-CM

## 2011-03-29 DIAGNOSIS — E785 Hyperlipidemia, unspecified: Secondary | ICD-10-CM

## 2011-03-29 DIAGNOSIS — I480 Paroxysmal atrial fibrillation: Secondary | ICD-10-CM

## 2011-03-29 DIAGNOSIS — I4891 Unspecified atrial fibrillation: Secondary | ICD-10-CM

## 2011-03-29 DIAGNOSIS — I10 Essential (primary) hypertension: Secondary | ICD-10-CM

## 2011-03-29 NOTE — Patient Instructions (Signed)
Your physician you to follow up in 1 year. You will receive a reminder letter in the mail one-two months in advance. If you don't receive a letter, please call our office to schedule the follow-up appointment. Your physician recommends that you continue on your current medications as directed. Please refer to the Current Medication list given to you today. 

## 2011-03-29 NOTE — Assessment & Plan Note (Signed)
I had a careful discussion with the patient today about Coumadin and the newer anticoagulants. I explained all the pros and cons. At this time she would like to remain on Coumadin and I feel this is appropriate as she's been quite stable.

## 2011-03-29 NOTE — Assessment & Plan Note (Signed)
Patient is maintaining sinus rhythm. No change in therapy. Coumadin will be continued.

## 2011-03-29 NOTE — Assessment & Plan Note (Signed)
Systolic blood pressure is elevated today. She will need followup with her primary physician concerning any further adjustments in her meds.

## 2011-03-29 NOTE — Progress Notes (Signed)
HPI   Patient is seen today to followup paroxysmal atrial fibrillation. She is doing well. She's not had any palpitations. I saw her last December, 2011. She's not in any chest pain.   As part of today's evaluation I have reviewed the patient's old records. I have carefully updated the new electronic medical record.  No Known Allergies  Current Outpatient Prescriptions  Medication Sig Dispense Refill  . COUMADIN 5 MG tablet USE AS DIRECTED BY ANTICOAGULATION CLINIC.  30 each  3  . diltiazem (DILACOR XR) 120 MG 24 hr capsule Take 120 mg by mouth every evening.        . diltiazem (TIAZAC) 240 MG 24 hr capsule Take 240 mg by mouth every morning.        . glyBURIDE (DIABETA) 5 MG tablet Take 2 by mouth in the AM and 1 by mouth in PM       . lisinopril-hydrochlorothiazide (PRINZIDE,ZESTORETIC) 20-12.5 MG per tablet Take 2 tablets by mouth every morning.        . metFORMIN (GLUCOPHAGE) 500 MG tablet 1 tablet every morning and 1.5 tablet every morning       . simvastatin (ZOCOR) 20 MG tablet Take 20 mg by mouth at bedtime.          History   Social History  . Marital Status: Divorced    Spouse Name: N/A    Number of Children: N/A  . Years of Education: N/A   Occupational History  . Not on file.   Social History Main Topics  . Smoking status: Never Smoker   . Smokeless tobacco: Never Used  . Alcohol Use: Not on file  . Drug Use: Not on file  . Sexually Active: Not on file   Other Topics Concern  . Not on file   Social History Narrative  . No narrative on file    Family History  Problem Relation Age of Onset  . Coronary artery disease      FAMILY H/O    Past Medical History  Diagnosis Date  . Paroxysmal atrial fibrillation     CHADS2 (4). Coumadin Rx  . Hypertension   . Stroke     dysarthria, resolved 2009  . Renal insufficiency     chronic  . Dyslipidemia   . Peripheral neuropathy   . Diabetes mellitus   . Amputation, traumatic, toes     two toes right foot  .  Cataract 2011  . Ejection fraction     EF 60-65% 09/2007, echo  . Warfarin anticoagulation     Atrial fib    Past Surgical History  Procedure Date  . Appendectomy   . Cholecystectomy   . Toe amputation     RIGHT    ROS  Patient denies fever, chills, headache, sweats, rash, change in vision, change in hearing, chest pain, cough, nausea vomiting, urinary symptoms. All other systems are reviewed and are negative.  PHYSICAL EXAM  Patient is oriented to person time and place. Affect is normal. Head is atraumatic. There is no xanthelasma. There is no jugulovenous distention. Lungs are clear. Respiratory effort is nonlabored. Cardiac exam reveals S1 and S2. The rhythm is regular. There no clicks or significant murmurs. The abdomen is soft. There is no peripheral edema. There are no musculoskeletal deformities. There are no skin rashes.  Filed Vitals:   03/29/11 1424  BP: 160/69  Pulse: 92  Height: 5\' 6"  (1.676 m)  Weight: 175 lb (79.379 kg)  SpO2: 94%  EKG EKG is done today and reviewed by me. The baseline is noisy. However there are P waves that are regular and this is definitely sinus rhythm. The rate is 85.  ASSESSMENT & PLAN

## 2011-03-29 NOTE — Assessment & Plan Note (Signed)
Lipids are being treated. No change in therapy by me.

## 2011-04-04 ENCOUNTER — Ambulatory Visit (INDEPENDENT_AMBULATORY_CARE_PROVIDER_SITE_OTHER): Payer: Medicare Other | Admitting: *Deleted

## 2011-04-04 DIAGNOSIS — I6350 Cerebral infarction due to unspecified occlusion or stenosis of unspecified cerebral artery: Secondary | ICD-10-CM

## 2011-04-04 DIAGNOSIS — I4891 Unspecified atrial fibrillation: Secondary | ICD-10-CM

## 2011-04-04 DIAGNOSIS — Z7901 Long term (current) use of anticoagulants: Secondary | ICD-10-CM

## 2011-05-16 ENCOUNTER — Ambulatory Visit (INDEPENDENT_AMBULATORY_CARE_PROVIDER_SITE_OTHER): Payer: Medicare Other | Admitting: *Deleted

## 2011-05-16 DIAGNOSIS — I6350 Cerebral infarction due to unspecified occlusion or stenosis of unspecified cerebral artery: Secondary | ICD-10-CM

## 2011-05-16 DIAGNOSIS — Z7901 Long term (current) use of anticoagulants: Secondary | ICD-10-CM

## 2011-05-16 DIAGNOSIS — I4891 Unspecified atrial fibrillation: Secondary | ICD-10-CM

## 2011-05-30 ENCOUNTER — Ambulatory Visit (INDEPENDENT_AMBULATORY_CARE_PROVIDER_SITE_OTHER): Payer: Medicare Other | Admitting: *Deleted

## 2011-05-30 DIAGNOSIS — I4891 Unspecified atrial fibrillation: Secondary | ICD-10-CM

## 2011-05-30 DIAGNOSIS — I6350 Cerebral infarction due to unspecified occlusion or stenosis of unspecified cerebral artery: Secondary | ICD-10-CM

## 2011-05-30 DIAGNOSIS — Z7901 Long term (current) use of anticoagulants: Secondary | ICD-10-CM

## 2011-05-30 LAB — POCT INR: INR: 3.7

## 2011-06-20 ENCOUNTER — Ambulatory Visit (INDEPENDENT_AMBULATORY_CARE_PROVIDER_SITE_OTHER): Payer: Medicare Other | Admitting: *Deleted

## 2011-06-20 DIAGNOSIS — I4891 Unspecified atrial fibrillation: Secondary | ICD-10-CM

## 2011-06-20 DIAGNOSIS — I6350 Cerebral infarction due to unspecified occlusion or stenosis of unspecified cerebral artery: Secondary | ICD-10-CM

## 2011-06-20 DIAGNOSIS — Z7901 Long term (current) use of anticoagulants: Secondary | ICD-10-CM

## 2011-07-25 ENCOUNTER — Ambulatory Visit (INDEPENDENT_AMBULATORY_CARE_PROVIDER_SITE_OTHER): Payer: Medicare Other | Admitting: *Deleted

## 2011-07-25 DIAGNOSIS — I6350 Cerebral infarction due to unspecified occlusion or stenosis of unspecified cerebral artery: Secondary | ICD-10-CM

## 2011-07-25 DIAGNOSIS — Z7901 Long term (current) use of anticoagulants: Secondary | ICD-10-CM

## 2011-07-25 DIAGNOSIS — I4891 Unspecified atrial fibrillation: Secondary | ICD-10-CM

## 2011-07-25 LAB — POCT INR: INR: 2.9

## 2011-08-04 ENCOUNTER — Other Ambulatory Visit: Payer: Self-pay | Admitting: Cardiology

## 2011-08-07 ENCOUNTER — Other Ambulatory Visit: Payer: Self-pay | Admitting: Cardiology

## 2011-08-22 ENCOUNTER — Ambulatory Visit (INDEPENDENT_AMBULATORY_CARE_PROVIDER_SITE_OTHER): Payer: Medicare Other | Admitting: *Deleted

## 2011-08-22 DIAGNOSIS — I4891 Unspecified atrial fibrillation: Secondary | ICD-10-CM

## 2011-08-22 DIAGNOSIS — I6350 Cerebral infarction due to unspecified occlusion or stenosis of unspecified cerebral artery: Secondary | ICD-10-CM

## 2011-08-22 DIAGNOSIS — Z7901 Long term (current) use of anticoagulants: Secondary | ICD-10-CM

## 2011-08-22 LAB — POCT INR: INR: 3.9

## 2011-09-12 ENCOUNTER — Ambulatory Visit (INDEPENDENT_AMBULATORY_CARE_PROVIDER_SITE_OTHER): Payer: Medicare Other | Admitting: Cardiology

## 2011-09-12 DIAGNOSIS — Z7901 Long term (current) use of anticoagulants: Secondary | ICD-10-CM

## 2011-09-12 DIAGNOSIS — I6350 Cerebral infarction due to unspecified occlusion or stenosis of unspecified cerebral artery: Secondary | ICD-10-CM

## 2011-09-12 DIAGNOSIS — I4891 Unspecified atrial fibrillation: Secondary | ICD-10-CM

## 2011-09-12 LAB — POCT INR: INR: 2.6

## 2011-10-10 ENCOUNTER — Ambulatory Visit (INDEPENDENT_AMBULATORY_CARE_PROVIDER_SITE_OTHER): Payer: Medicare Other | Admitting: *Deleted

## 2011-10-10 DIAGNOSIS — I4891 Unspecified atrial fibrillation: Secondary | ICD-10-CM

## 2011-10-10 DIAGNOSIS — Z7901 Long term (current) use of anticoagulants: Secondary | ICD-10-CM

## 2011-10-10 DIAGNOSIS — I6350 Cerebral infarction due to unspecified occlusion or stenosis of unspecified cerebral artery: Secondary | ICD-10-CM

## 2011-10-10 LAB — POCT INR: INR: 3.5

## 2011-11-14 ENCOUNTER — Ambulatory Visit (INDEPENDENT_AMBULATORY_CARE_PROVIDER_SITE_OTHER): Payer: Medicare Other | Admitting: *Deleted

## 2011-11-14 DIAGNOSIS — I4891 Unspecified atrial fibrillation: Secondary | ICD-10-CM

## 2011-11-14 DIAGNOSIS — I6350 Cerebral infarction due to unspecified occlusion or stenosis of unspecified cerebral artery: Secondary | ICD-10-CM

## 2011-11-14 DIAGNOSIS — Z7901 Long term (current) use of anticoagulants: Secondary | ICD-10-CM

## 2011-11-14 LAB — POCT INR: INR: 5.8

## 2011-11-21 ENCOUNTER — Ambulatory Visit (INDEPENDENT_AMBULATORY_CARE_PROVIDER_SITE_OTHER): Payer: Medicare Other | Admitting: *Deleted

## 2011-11-21 DIAGNOSIS — Z7901 Long term (current) use of anticoagulants: Secondary | ICD-10-CM

## 2011-11-21 DIAGNOSIS — I4891 Unspecified atrial fibrillation: Secondary | ICD-10-CM

## 2011-11-21 DIAGNOSIS — I6350 Cerebral infarction due to unspecified occlusion or stenosis of unspecified cerebral artery: Secondary | ICD-10-CM

## 2011-11-21 LAB — POCT INR: INR: 1.4

## 2011-12-05 ENCOUNTER — Ambulatory Visit (INDEPENDENT_AMBULATORY_CARE_PROVIDER_SITE_OTHER): Payer: Medicare Other | Admitting: *Deleted

## 2011-12-05 DIAGNOSIS — I6350 Cerebral infarction due to unspecified occlusion or stenosis of unspecified cerebral artery: Secondary | ICD-10-CM

## 2011-12-05 DIAGNOSIS — Z7901 Long term (current) use of anticoagulants: Secondary | ICD-10-CM

## 2011-12-05 DIAGNOSIS — I4891 Unspecified atrial fibrillation: Secondary | ICD-10-CM

## 2011-12-26 ENCOUNTER — Ambulatory Visit (INDEPENDENT_AMBULATORY_CARE_PROVIDER_SITE_OTHER): Payer: Medicare Other | Admitting: *Deleted

## 2011-12-26 DIAGNOSIS — I4891 Unspecified atrial fibrillation: Secondary | ICD-10-CM

## 2011-12-26 DIAGNOSIS — I6350 Cerebral infarction due to unspecified occlusion or stenosis of unspecified cerebral artery: Secondary | ICD-10-CM

## 2011-12-26 DIAGNOSIS — Z7901 Long term (current) use of anticoagulants: Secondary | ICD-10-CM

## 2012-01-23 ENCOUNTER — Ambulatory Visit (INDEPENDENT_AMBULATORY_CARE_PROVIDER_SITE_OTHER): Payer: Medicare Other | Admitting: *Deleted

## 2012-01-23 DIAGNOSIS — Z7901 Long term (current) use of anticoagulants: Secondary | ICD-10-CM

## 2012-01-23 DIAGNOSIS — I6350 Cerebral infarction due to unspecified occlusion or stenosis of unspecified cerebral artery: Secondary | ICD-10-CM

## 2012-01-23 DIAGNOSIS — I4891 Unspecified atrial fibrillation: Secondary | ICD-10-CM

## 2012-01-23 LAB — POCT INR: INR: 1.8

## 2012-02-09 ENCOUNTER — Ambulatory Visit (INDEPENDENT_AMBULATORY_CARE_PROVIDER_SITE_OTHER): Payer: Medicare Other | Admitting: *Deleted

## 2012-02-09 DIAGNOSIS — Z7901 Long term (current) use of anticoagulants: Secondary | ICD-10-CM

## 2012-02-09 DIAGNOSIS — I6350 Cerebral infarction due to unspecified occlusion or stenosis of unspecified cerebral artery: Secondary | ICD-10-CM

## 2012-02-09 DIAGNOSIS — I4891 Unspecified atrial fibrillation: Secondary | ICD-10-CM

## 2012-03-15 ENCOUNTER — Ambulatory Visit (INDEPENDENT_AMBULATORY_CARE_PROVIDER_SITE_OTHER): Payer: Medicare Other | Admitting: *Deleted

## 2012-03-15 DIAGNOSIS — I6350 Cerebral infarction due to unspecified occlusion or stenosis of unspecified cerebral artery: Secondary | ICD-10-CM

## 2012-03-15 DIAGNOSIS — Z7901 Long term (current) use of anticoagulants: Secondary | ICD-10-CM

## 2012-03-15 DIAGNOSIS — I4891 Unspecified atrial fibrillation: Secondary | ICD-10-CM

## 2012-03-19 ENCOUNTER — Encounter: Payer: Self-pay | Admitting: Cardiology

## 2012-03-19 DIAGNOSIS — Z01810 Encounter for preprocedural cardiovascular examination: Secondary | ICD-10-CM

## 2012-03-20 ENCOUNTER — Encounter: Payer: Self-pay | Admitting: Cardiology

## 2012-04-08 ENCOUNTER — Encounter: Payer: Self-pay | Admitting: Cardiology

## 2012-04-08 ENCOUNTER — Ambulatory Visit (INDEPENDENT_AMBULATORY_CARE_PROVIDER_SITE_OTHER): Payer: Medicare Other | Admitting: Cardiology

## 2012-04-08 VITALS — BP 156/77 | HR 88 | Ht 66.0 in | Wt 169.0 lb

## 2012-04-08 DIAGNOSIS — I10 Essential (primary) hypertension: Secondary | ICD-10-CM

## 2012-04-08 DIAGNOSIS — I6350 Cerebral infarction due to unspecified occlusion or stenosis of unspecified cerebral artery: Secondary | ICD-10-CM

## 2012-04-08 DIAGNOSIS — I639 Cerebral infarction, unspecified: Secondary | ICD-10-CM

## 2012-04-08 DIAGNOSIS — R42 Dizziness and giddiness: Secondary | ICD-10-CM

## 2012-04-08 DIAGNOSIS — I480 Paroxysmal atrial fibrillation: Secondary | ICD-10-CM

## 2012-04-08 DIAGNOSIS — I4891 Unspecified atrial fibrillation: Secondary | ICD-10-CM

## 2012-04-08 NOTE — Assessment & Plan Note (Signed)
Patient had an episode with some dizziness and a fall. It has been called vertigo by the hospital team. She's not having any ongoing symptoms. No further workup is needed.  As part of today's evaluation I spent greater than 25 minutes with the patient. This has included reviewing data and talking with her and updating the records. More than half of this time was spent in direct contact with the patient reviewing her overall status.

## 2012-04-08 NOTE — Patient Instructions (Addendum)

## 2012-04-08 NOTE — Assessment & Plan Note (Signed)
It is very important that she continue her Coumadin or an equivalent medication.

## 2012-04-08 NOTE — Progress Notes (Signed)
HPI   The patient is seen to followup atrial fibrillation. She has a history of paroxysmal atrial fibrillation. Recently she had an episode with some dizziness and falling spell. She did not have complete syncope. It was felt that she may have had some vertigo. She stabilized. The hospital record does not suggest any episode of atrial fibrillation. She is stable now.  As part of today's evaluation I have reviewed the hospital records. I have reviewed the H&P in the discharge summary. I reviewed the labs and the x-rays.  No Known Allergies  Current Outpatient Prescriptions  Medication Sig Dispense Refill  . amitriptyline (ELAVIL) 100 MG tablet Take 100 mg by mouth at bedtime.      Marland Kitchen COUMADIN 5 MG tablet USE AS DIRECTED BY ANTICOAGULATION CLINIC.  30 each  3  . diltiazem (CARTIA XT) 120 MG 24 hr capsule Take 120 mg by mouth daily.      Marland Kitchen diltiazem (CARTIA XT) 240 MG 24 hr capsule Take 240 mg by mouth daily.      Marland Kitchen diltiazem (CARTIA XT) 240 MG 24 hr capsule Take 240 mg by mouth daily.      Marland Kitchen glyBURIDE (DIABETA) 5 MG tablet Take 10 mg by mouth 2 (two) times daily with a meal.       . lisinopril-hydrochlorothiazide (PRINZIDE,ZESTORETIC) 20-12.5 MG per tablet Take 2 tablets by mouth every morning.       . metFORMIN (GLUCOPHAGE) 850 MG tablet Take 2,550 mg by mouth daily.      . simvastatin (ZOCOR) 20 MG tablet Take 20 mg by mouth at bedtime.          History   Social History  . Marital Status: Divorced    Spouse Name: N/A    Number of Children: N/A  . Years of Education: N/A   Occupational History  . Not on file.   Social History Main Topics  . Smoking status: Never Smoker   . Smokeless tobacco: Never Used  . Alcohol Use: Not on file  . Drug Use: Not on file  . Sexually Active: Not on file   Other Topics Concern  . Not on file   Social History Narrative  . No narrative on file    Family History  Problem Relation Age of Onset  . Coronary artery disease      FAMILY H/O     Past Medical History  Diagnosis Date  . Paroxysmal atrial fibrillation     CHADS2 (4). Coumadin Rx  . Hypertension   . Stroke     dysarthria, resolved 2009  . Renal insufficiency     chronic  . Dyslipidemia   . Peripheral neuropathy   . Diabetes mellitus   . Amputation, traumatic, toes     two toes right foot  . Cataract 2011  . Ejection fraction     EF 60-65% 09/2007, echo  . Warfarin anticoagulation     Atrial fib  . Vertigo     Vertigo with a fall 03/2012    Past Surgical History  Procedure Date  . Appendectomy   . Cholecystectomy   . Toe amputation     RIGHT    Patient Active Problem List  Diagnosis  . Paroxysmal atrial fibrillation  . Hypertension  . Stroke  . Renal insufficiency  . Dyslipidemia  . Peripheral neuropathy  . Diabetes mellitus  . Amputation, traumatic, toes  . Ejection fraction  . Warfarin anticoagulation  . Vertigo    ROS  Patient denies fever, chills, headache, sweats, rash, change in vision, change in hearing, chest pain, cough, nausea vomiting, urinary symptoms. All other systems are reviewed and are negative.  PHYSICAL EXAM   Patient is oriented to person time and place. Affect is normal. There is no jugulovenous distention. Lungs are clear. Respiratory effort is nonlabored. Cardiac exam reveals S1 and S2. There no clicks or significant murmurs. The abdomen is soft. There is no peripheral edema. There are no musculoskeletal deformities. There are no skin rashes.  Filed Vitals:   04/08/12 1438  BP: 156/77  Pulse: 88  Height: 5\' 6"  (1.676 m)  Weight: 169 lb (76.658 kg)     ASSESSMENT & PLAN

## 2012-04-08 NOTE — Assessment & Plan Note (Signed)
There is a history of paroxysmal atrial fibrillation. We have no definite evidence of any marked recent arrhythmia. Coumadin is continued. She seems to be stable on 360 mg of diltiazem at this time. No change in therapy. She receives a 240 mg tablet and 120 mg tablet. I suspect this is because there is no 360 mg generic tablet.

## 2012-04-08 NOTE — Assessment & Plan Note (Signed)
There is slight elevation of her systolic blood pressure today. I have not changed her medicines. However I have definitely not decreased her diltiazem dose.

## 2012-04-12 ENCOUNTER — Ambulatory Visit (INDEPENDENT_AMBULATORY_CARE_PROVIDER_SITE_OTHER): Payer: Medicare Other | Admitting: *Deleted

## 2012-04-12 DIAGNOSIS — I6350 Cerebral infarction due to unspecified occlusion or stenosis of unspecified cerebral artery: Secondary | ICD-10-CM

## 2012-04-12 DIAGNOSIS — I4891 Unspecified atrial fibrillation: Secondary | ICD-10-CM

## 2012-04-12 DIAGNOSIS — Z7901 Long term (current) use of anticoagulants: Secondary | ICD-10-CM

## 2012-04-12 LAB — POCT INR: INR: 3

## 2012-04-23 ENCOUNTER — Telehealth: Payer: Self-pay | Admitting: Cardiology

## 2012-04-23 NOTE — Telephone Encounter (Signed)
[phone] dr Andrey Campanile needs ok to stop and when to resume blood thinner prior to thyroid biopsy

## 2012-04-23 NOTE — Telephone Encounter (Signed)
Pt to have thyroid BX 05/02/12, please advise. Will forward for dr/nurse to review, they are aware dr/nurse out of office and will return tomorrow.

## 2012-04-24 ENCOUNTER — Encounter: Payer: Self-pay | Admitting: *Deleted

## 2012-04-24 NOTE — Telephone Encounter (Signed)
Has had TIA. Must be bridged if coming off coumadin for procedure.

## 2012-04-24 NOTE — Progress Notes (Signed)
This encounter was created in error - please disregard.

## 2012-04-24 NOTE — Telephone Encounter (Signed)
Pt cannot come off blood thinner unless she is bridged with Lovenox per Dr Myrtis Ser.  ENT was notified.  They will check with patient and make sure she is ok with Lovenox.

## 2012-04-25 NOTE — Telephone Encounter (Signed)
Will forward message to Vashti Hey for bridging.  Sarah, from Dr Tawana Scale office was notified that Mrs Foister is an [place] pt and they will handle the bridging with Lovenox.

## 2012-04-25 NOTE — Telephone Encounter (Signed)
New Problem   Pt is wanting to do lozenex bridge. Would like to speak to you.

## 2012-04-26 ENCOUNTER — Encounter: Payer: Self-pay | Admitting: *Deleted

## 2012-04-26 NOTE — Progress Notes (Signed)
This encounter was created in error - please disregard.

## 2012-04-30 ENCOUNTER — Telehealth: Payer: Self-pay | Admitting: Cardiology

## 2012-04-30 NOTE — Telephone Encounter (Signed)
Spoke with pt.  Questions clarified.

## 2012-04-30 NOTE — Telephone Encounter (Signed)
She wants to know if she needs to take coumdian the night of surgery.

## 2012-05-07 NOTE — Patient Instructions (Addendum)
2/21  Last dose of coumadin 2/22  No Lovenox or coumadin 2/23  Lovenox 120mg  @ 9am 2/24  Lovenox 120mg  @ 9am 2/25  Lovenox 120mg  @ 9am 2/26  Lovenox 120mg  @ 9am 2/27  No lovenox----Biopsy-----Coumadin 5mg  pm 2/28  Lovenox 120mg  @ 9am & coumadin 5mg  pm 3/1   Lovenox 120mg  @ 9am & coumadin 5mg  pm 3/2   Lovenox 120mg  @ 9am & coumadin 5mg  pm 3/3   Lovenox 120mg  @ 9am & coumadin 2.5mg  pm 3/4  Lovenox 120mg  @ 9am  & INR check 11:10am

## 2012-05-17 ENCOUNTER — Ambulatory Visit (INDEPENDENT_AMBULATORY_CARE_PROVIDER_SITE_OTHER): Payer: Medicare Other | Admitting: *Deleted

## 2012-05-17 DIAGNOSIS — I4891 Unspecified atrial fibrillation: Secondary | ICD-10-CM

## 2012-06-10 ENCOUNTER — Other Ambulatory Visit: Payer: Self-pay | Admitting: Cardiology

## 2012-06-10 MED ORDER — WARFARIN SODIUM 5 MG PO TABS: 5.0000 mg | ORAL_TABLET | Freq: Every day | ORAL | Status: DC

## 2012-06-21 ENCOUNTER — Ambulatory Visit (INDEPENDENT_AMBULATORY_CARE_PROVIDER_SITE_OTHER): Payer: Medicare Other | Admitting: *Deleted

## 2012-06-21 DIAGNOSIS — I6350 Cerebral infarction due to unspecified occlusion or stenosis of unspecified cerebral artery: Secondary | ICD-10-CM

## 2012-06-21 DIAGNOSIS — I4891 Unspecified atrial fibrillation: Secondary | ICD-10-CM

## 2012-06-21 DIAGNOSIS — Z7901 Long term (current) use of anticoagulants: Secondary | ICD-10-CM

## 2012-07-12 ENCOUNTER — Ambulatory Visit (INDEPENDENT_AMBULATORY_CARE_PROVIDER_SITE_OTHER): Payer: Medicare Other | Admitting: *Deleted

## 2012-07-12 DIAGNOSIS — Z7901 Long term (current) use of anticoagulants: Secondary | ICD-10-CM

## 2012-07-12 DIAGNOSIS — I4891 Unspecified atrial fibrillation: Secondary | ICD-10-CM

## 2012-07-12 DIAGNOSIS — I6350 Cerebral infarction due to unspecified occlusion or stenosis of unspecified cerebral artery: Secondary | ICD-10-CM

## 2012-08-09 ENCOUNTER — Ambulatory Visit (INDEPENDENT_AMBULATORY_CARE_PROVIDER_SITE_OTHER): Payer: Medicare Other | Admitting: *Deleted

## 2012-08-09 DIAGNOSIS — I4891 Unspecified atrial fibrillation: Secondary | ICD-10-CM

## 2012-08-09 DIAGNOSIS — I6350 Cerebral infarction due to unspecified occlusion or stenosis of unspecified cerebral artery: Secondary | ICD-10-CM

## 2012-08-09 DIAGNOSIS — Z7901 Long term (current) use of anticoagulants: Secondary | ICD-10-CM

## 2012-08-09 LAB — POCT INR: INR: 3.6

## 2012-08-30 ENCOUNTER — Ambulatory Visit (INDEPENDENT_AMBULATORY_CARE_PROVIDER_SITE_OTHER): Payer: Medicare Other | Admitting: *Deleted

## 2012-08-30 DIAGNOSIS — Z7901 Long term (current) use of anticoagulants: Secondary | ICD-10-CM

## 2012-08-30 DIAGNOSIS — I6350 Cerebral infarction due to unspecified occlusion or stenosis of unspecified cerebral artery: Secondary | ICD-10-CM

## 2012-08-30 DIAGNOSIS — I4891 Unspecified atrial fibrillation: Secondary | ICD-10-CM

## 2012-08-30 LAB — POCT INR: INR: 3.4

## 2012-09-17 ENCOUNTER — Ambulatory Visit (INDEPENDENT_AMBULATORY_CARE_PROVIDER_SITE_OTHER): Payer: Medicare Other | Admitting: *Deleted

## 2012-09-17 DIAGNOSIS — I4891 Unspecified atrial fibrillation: Secondary | ICD-10-CM

## 2012-09-17 DIAGNOSIS — I6350 Cerebral infarction due to unspecified occlusion or stenosis of unspecified cerebral artery: Secondary | ICD-10-CM

## 2012-09-17 DIAGNOSIS — Z7901 Long term (current) use of anticoagulants: Secondary | ICD-10-CM

## 2012-10-15 ENCOUNTER — Ambulatory Visit (INDEPENDENT_AMBULATORY_CARE_PROVIDER_SITE_OTHER): Payer: Medicare Other | Admitting: *Deleted

## 2012-10-15 DIAGNOSIS — I6350 Cerebral infarction due to unspecified occlusion or stenosis of unspecified cerebral artery: Secondary | ICD-10-CM

## 2012-10-15 DIAGNOSIS — I4891 Unspecified atrial fibrillation: Secondary | ICD-10-CM

## 2012-10-15 DIAGNOSIS — Z7901 Long term (current) use of anticoagulants: Secondary | ICD-10-CM

## 2012-10-15 LAB — POCT INR: INR: 2.6

## 2012-11-19 ENCOUNTER — Ambulatory Visit (INDEPENDENT_AMBULATORY_CARE_PROVIDER_SITE_OTHER): Payer: Medicare Other | Admitting: *Deleted

## 2012-11-19 DIAGNOSIS — Z7901 Long term (current) use of anticoagulants: Secondary | ICD-10-CM

## 2012-11-19 DIAGNOSIS — I6350 Cerebral infarction due to unspecified occlusion or stenosis of unspecified cerebral artery: Secondary | ICD-10-CM

## 2012-11-19 DIAGNOSIS — I4891 Unspecified atrial fibrillation: Secondary | ICD-10-CM

## 2012-11-19 LAB — POCT INR: INR: 1.6

## 2012-12-10 ENCOUNTER — Ambulatory Visit (INDEPENDENT_AMBULATORY_CARE_PROVIDER_SITE_OTHER): Payer: Medicare Other | Admitting: *Deleted

## 2012-12-10 DIAGNOSIS — Z7901 Long term (current) use of anticoagulants: Secondary | ICD-10-CM

## 2012-12-10 DIAGNOSIS — I4891 Unspecified atrial fibrillation: Secondary | ICD-10-CM

## 2012-12-10 DIAGNOSIS — I6350 Cerebral infarction due to unspecified occlusion or stenosis of unspecified cerebral artery: Secondary | ICD-10-CM

## 2012-12-10 LAB — POCT INR: INR: 1.9

## 2012-12-23 ENCOUNTER — Other Ambulatory Visit: Payer: Self-pay | Admitting: Cardiology

## 2012-12-31 ENCOUNTER — Ambulatory Visit (INDEPENDENT_AMBULATORY_CARE_PROVIDER_SITE_OTHER): Payer: Medicare Other | Admitting: *Deleted

## 2012-12-31 DIAGNOSIS — I6350 Cerebral infarction due to unspecified occlusion or stenosis of unspecified cerebral artery: Secondary | ICD-10-CM

## 2012-12-31 DIAGNOSIS — Z7901 Long term (current) use of anticoagulants: Secondary | ICD-10-CM

## 2012-12-31 DIAGNOSIS — I4891 Unspecified atrial fibrillation: Secondary | ICD-10-CM

## 2013-01-28 ENCOUNTER — Ambulatory Visit (INDEPENDENT_AMBULATORY_CARE_PROVIDER_SITE_OTHER): Payer: Medicare Other | Admitting: *Deleted

## 2013-01-28 DIAGNOSIS — I4891 Unspecified atrial fibrillation: Secondary | ICD-10-CM

## 2013-01-28 DIAGNOSIS — Z7901 Long term (current) use of anticoagulants: Secondary | ICD-10-CM

## 2013-01-28 DIAGNOSIS — I6350 Cerebral infarction due to unspecified occlusion or stenosis of unspecified cerebral artery: Secondary | ICD-10-CM

## 2013-01-28 LAB — POCT INR: INR: 2

## 2013-02-25 ENCOUNTER — Ambulatory Visit (INDEPENDENT_AMBULATORY_CARE_PROVIDER_SITE_OTHER): Payer: Medicare Other | Admitting: Pharmacist

## 2013-02-25 DIAGNOSIS — Z7901 Long term (current) use of anticoagulants: Secondary | ICD-10-CM

## 2013-02-25 DIAGNOSIS — I6350 Cerebral infarction due to unspecified occlusion or stenosis of unspecified cerebral artery: Secondary | ICD-10-CM

## 2013-02-25 DIAGNOSIS — I4891 Unspecified atrial fibrillation: Secondary | ICD-10-CM

## 2013-02-25 LAB — POCT INR: INR: 2

## 2013-03-25 ENCOUNTER — Ambulatory Visit (INDEPENDENT_AMBULATORY_CARE_PROVIDER_SITE_OTHER): Payer: Medicare Other | Admitting: *Deleted

## 2013-03-25 DIAGNOSIS — I4891 Unspecified atrial fibrillation: Secondary | ICD-10-CM

## 2013-03-25 DIAGNOSIS — I6350 Cerebral infarction due to unspecified occlusion or stenosis of unspecified cerebral artery: Secondary | ICD-10-CM

## 2013-03-25 DIAGNOSIS — Z7901 Long term (current) use of anticoagulants: Secondary | ICD-10-CM

## 2013-03-25 LAB — POCT INR: INR: 2.4

## 2013-04-18 ENCOUNTER — Ambulatory Visit (INDEPENDENT_AMBULATORY_CARE_PROVIDER_SITE_OTHER): Payer: Medicare Other | Admitting: Cardiology

## 2013-04-18 ENCOUNTER — Encounter: Payer: Self-pay | Admitting: Cardiology

## 2013-04-18 ENCOUNTER — Ambulatory Visit (INDEPENDENT_AMBULATORY_CARE_PROVIDER_SITE_OTHER): Payer: Medicare Other | Admitting: *Deleted

## 2013-04-18 VITALS — BP 136/77 | HR 82 | Ht 66.0 in | Wt 170.0 lb

## 2013-04-18 DIAGNOSIS — I10 Essential (primary) hypertension: Secondary | ICD-10-CM

## 2013-04-18 DIAGNOSIS — I4891 Unspecified atrial fibrillation: Secondary | ICD-10-CM

## 2013-04-18 DIAGNOSIS — I639 Cerebral infarction, unspecified: Secondary | ICD-10-CM

## 2013-04-18 DIAGNOSIS — I480 Paroxysmal atrial fibrillation: Secondary | ICD-10-CM

## 2013-04-18 DIAGNOSIS — I6350 Cerebral infarction due to unspecified occlusion or stenosis of unspecified cerebral artery: Secondary | ICD-10-CM

## 2013-04-18 DIAGNOSIS — Z5181 Encounter for therapeutic drug level monitoring: Secondary | ICD-10-CM

## 2013-04-18 DIAGNOSIS — Z7901 Long term (current) use of anticoagulants: Secondary | ICD-10-CM

## 2013-04-18 LAB — POCT INR: INR: 1.7

## 2013-04-18 NOTE — Progress Notes (Signed)
HPI  Patient is seen today to followup a history of paroxysmal atrial fibrillation. She did have an CVA in the past. She's been quite stable. She takes her Coumadin reliably. She had a significant dizzy spell in January, 2014. It may have been from vertigo. She's not had any marked problems since then.  No Known Allergies  Current Outpatient Prescriptions  Medication Sig Dispense Refill  . amitriptyline (ELAVIL) 100 MG tablet Take 100 mg by mouth at bedtime.      Marland Kitchen. diltiazem (CARTIA XT) 120 MG 24 hr capsule Take 120 mg by mouth daily.      Marland Kitchen. glyBURIDE (DIABETA) 5 MG tablet Take 10 mg by mouth 2 (two) times daily with a meal.       . lisinopril-hydrochlorothiazide (PRINZIDE,ZESTORETIC) 20-12.5 MG per tablet Take 2 tablets by mouth every morning.       . metFORMIN (GLUCOPHAGE) 850 MG tablet Take 2,550 mg by mouth daily.      . simvastatin (ZOCOR) 20 MG tablet Take 20 mg by mouth at bedtime.        Marland Kitchen. warfarin (COUMADIN) 5 MG tablet Take 1/2 tablet daily except 1 tablet on M,W,F       No current facility-administered medications for this visit.    History   Social History  . Marital Status: Divorced    Spouse Name: N/A    Number of Children: N/A  . Years of Education: N/A   Occupational History  . Not on file.   Social History Main Topics  . Smoking status: Never Smoker   . Smokeless tobacco: Never Used  . Alcohol Use: Not on file  . Drug Use: Not on file  . Sexual Activity: Not on file   Other Topics Concern  . Not on file   Social History Narrative  . No narrative on file    Family History  Problem Relation Age of Onset  . Coronary artery disease      FAMILY H/O    Past Medical History  Diagnosis Date  . Paroxysmal atrial fibrillation     CHADS2 (4). Coumadin Rx  . Hypertension   . Stroke     dysarthria, resolved 2009  . Renal insufficiency     chronic  . Dyslipidemia   . Peripheral neuropathy   . Diabetes mellitus   . Amputation, traumatic, toes    two toes right foot  . Cataract 2011  . Ejection fraction     EF 60-65% 09/2007, echo  . Warfarin anticoagulation     Atrial fib  . Vertigo     Vertigo with a fall 03/2012    Past Surgical History  Procedure Laterality Date  . Appendectomy    . Cholecystectomy    . Toe amputation      RIGHT    Patient Active Problem List   Diagnosis Date Noted  . Encounter for therapeutic drug monitoring 04/18/2013  . Vertigo   . Paroxysmal atrial fibrillation   . Hypertension   . Stroke   . Renal insufficiency   . Dyslipidemia   . Peripheral neuropathy   . Diabetes mellitus   . Amputation, traumatic, toes   . Ejection fraction   . Warfarin anticoagulation     ROS   Patient denies fever, chills, headache, sweats, rash, change in vision, change in hearing, chest pain, cough, nausea or vomiting, urinary symptoms. All other systems are reviewed and are negative.  PHYSICAL EXAM  Patient is oriented to person time and  place. Affect is normal. There is no jugulovenous distention. Lungs are clear. Respiratory effort is nonlabored. Cardiac exam reveals S1 and S2. The rhythm is regular. Abdomen is soft. There is no peripheral edema.  Filed Vitals:   04/18/13 1005  BP: 136/77  Pulse: 82  Height: 5\' 6"  (1.676 m)  Weight: 170 lb (77.111 kg)   EKG is done today and reviewed by me. There is sinus rhythm. There is old decreased R wave in V1 and V2.  ASSESSMENT & PLAN

## 2013-04-18 NOTE — Assessment & Plan Note (Signed)
With the neurologic event in the past, the patient definitely needs to remain on anticoagulation.

## 2013-04-18 NOTE — Assessment & Plan Note (Signed)
Patient continues on Coumadin. She is aware that there are newer agents. She and I both agree that we will not make a change at this time.

## 2013-04-18 NOTE — Patient Instructions (Signed)

## 2013-04-18 NOTE — Assessment & Plan Note (Signed)
Blood pressures control. No change in therapy. 

## 2013-04-18 NOTE — Assessment & Plan Note (Signed)
Patient is holding sinus rhythm. There is a history of a CVA. She is on Coumadin. No change.

## 2013-05-13 ENCOUNTER — Ambulatory Visit (INDEPENDENT_AMBULATORY_CARE_PROVIDER_SITE_OTHER): Payer: Medicare Other | Admitting: *Deleted

## 2013-05-13 DIAGNOSIS — Z7901 Long term (current) use of anticoagulants: Secondary | ICD-10-CM

## 2013-05-13 DIAGNOSIS — Z5181 Encounter for therapeutic drug level monitoring: Secondary | ICD-10-CM

## 2013-05-13 DIAGNOSIS — I4891 Unspecified atrial fibrillation: Secondary | ICD-10-CM

## 2013-05-13 DIAGNOSIS — I639 Cerebral infarction, unspecified: Secondary | ICD-10-CM

## 2013-05-13 DIAGNOSIS — I6350 Cerebral infarction due to unspecified occlusion or stenosis of unspecified cerebral artery: Secondary | ICD-10-CM

## 2013-05-13 DIAGNOSIS — I480 Paroxysmal atrial fibrillation: Secondary | ICD-10-CM

## 2013-05-13 LAB — POCT INR: INR: 3.4

## 2013-06-03 ENCOUNTER — Ambulatory Visit (INDEPENDENT_AMBULATORY_CARE_PROVIDER_SITE_OTHER): Payer: Medicare Other | Admitting: *Deleted

## 2013-06-03 DIAGNOSIS — I6350 Cerebral infarction due to unspecified occlusion or stenosis of unspecified cerebral artery: Secondary | ICD-10-CM

## 2013-06-03 DIAGNOSIS — I639 Cerebral infarction, unspecified: Secondary | ICD-10-CM

## 2013-06-03 DIAGNOSIS — Z5181 Encounter for therapeutic drug level monitoring: Secondary | ICD-10-CM

## 2013-06-03 DIAGNOSIS — I4891 Unspecified atrial fibrillation: Secondary | ICD-10-CM

## 2013-06-03 DIAGNOSIS — I480 Paroxysmal atrial fibrillation: Secondary | ICD-10-CM

## 2013-06-03 DIAGNOSIS — Z7901 Long term (current) use of anticoagulants: Secondary | ICD-10-CM

## 2013-06-03 LAB — POCT INR: INR: 3

## 2013-07-01 ENCOUNTER — Ambulatory Visit (INDEPENDENT_AMBULATORY_CARE_PROVIDER_SITE_OTHER): Payer: Medicare Other | Admitting: *Deleted

## 2013-07-01 DIAGNOSIS — I4891 Unspecified atrial fibrillation: Secondary | ICD-10-CM

## 2013-07-01 DIAGNOSIS — Z5181 Encounter for therapeutic drug level monitoring: Secondary | ICD-10-CM

## 2013-07-01 DIAGNOSIS — I639 Cerebral infarction, unspecified: Secondary | ICD-10-CM

## 2013-07-01 DIAGNOSIS — Z7901 Long term (current) use of anticoagulants: Secondary | ICD-10-CM

## 2013-07-01 DIAGNOSIS — I480 Paroxysmal atrial fibrillation: Secondary | ICD-10-CM

## 2013-07-01 DIAGNOSIS — I6350 Cerebral infarction due to unspecified occlusion or stenosis of unspecified cerebral artery: Secondary | ICD-10-CM

## 2013-07-01 LAB — POCT INR: INR: 2.4

## 2013-07-29 ENCOUNTER — Ambulatory Visit (INDEPENDENT_AMBULATORY_CARE_PROVIDER_SITE_OTHER): Payer: Medicare Other | Admitting: *Deleted

## 2013-07-29 DIAGNOSIS — Z5181 Encounter for therapeutic drug level monitoring: Secondary | ICD-10-CM

## 2013-07-29 DIAGNOSIS — I6350 Cerebral infarction due to unspecified occlusion or stenosis of unspecified cerebral artery: Secondary | ICD-10-CM

## 2013-07-29 DIAGNOSIS — I4891 Unspecified atrial fibrillation: Secondary | ICD-10-CM

## 2013-07-29 DIAGNOSIS — I480 Paroxysmal atrial fibrillation: Secondary | ICD-10-CM

## 2013-07-29 DIAGNOSIS — Z7901 Long term (current) use of anticoagulants: Secondary | ICD-10-CM

## 2013-07-29 DIAGNOSIS — I639 Cerebral infarction, unspecified: Secondary | ICD-10-CM

## 2013-07-29 LAB — POCT INR: INR: 2.7

## 2013-09-09 ENCOUNTER — Ambulatory Visit (INDEPENDENT_AMBULATORY_CARE_PROVIDER_SITE_OTHER): Payer: Medicare Other | Admitting: *Deleted

## 2013-09-09 DIAGNOSIS — Z7901 Long term (current) use of anticoagulants: Secondary | ICD-10-CM

## 2013-09-09 DIAGNOSIS — I639 Cerebral infarction, unspecified: Secondary | ICD-10-CM

## 2013-09-09 DIAGNOSIS — I6350 Cerebral infarction due to unspecified occlusion or stenosis of unspecified cerebral artery: Secondary | ICD-10-CM

## 2013-09-09 DIAGNOSIS — I480 Paroxysmal atrial fibrillation: Secondary | ICD-10-CM

## 2013-09-09 DIAGNOSIS — I4891 Unspecified atrial fibrillation: Secondary | ICD-10-CM

## 2013-09-09 DIAGNOSIS — Z5181 Encounter for therapeutic drug level monitoring: Secondary | ICD-10-CM

## 2013-09-09 LAB — POCT INR: INR: 2.6

## 2013-10-28 ENCOUNTER — Ambulatory Visit (INDEPENDENT_AMBULATORY_CARE_PROVIDER_SITE_OTHER): Payer: Medicare Other | Admitting: *Deleted

## 2013-10-28 DIAGNOSIS — Z5181 Encounter for therapeutic drug level monitoring: Secondary | ICD-10-CM

## 2013-10-28 DIAGNOSIS — I639 Cerebral infarction, unspecified: Secondary | ICD-10-CM

## 2013-10-28 DIAGNOSIS — Z7901 Long term (current) use of anticoagulants: Secondary | ICD-10-CM

## 2013-10-28 DIAGNOSIS — I4891 Unspecified atrial fibrillation: Secondary | ICD-10-CM

## 2013-10-28 DIAGNOSIS — I480 Paroxysmal atrial fibrillation: Secondary | ICD-10-CM

## 2013-10-28 DIAGNOSIS — I6350 Cerebral infarction due to unspecified occlusion or stenosis of unspecified cerebral artery: Secondary | ICD-10-CM

## 2013-10-28 LAB — POCT INR: INR: 1.8

## 2013-10-29 ENCOUNTER — Other Ambulatory Visit: Payer: Self-pay | Admitting: *Deleted

## 2013-10-29 ENCOUNTER — Other Ambulatory Visit: Payer: Self-pay | Admitting: Cardiology

## 2013-10-29 MED ORDER — WARFARIN SODIUM 5 MG PO TABS: ORAL_TABLET | ORAL | Status: DC

## 2013-11-04 HISTORY — PX: TOE AMPUTATION: SHX809

## 2013-11-21 ENCOUNTER — Ambulatory Visit (INDEPENDENT_AMBULATORY_CARE_PROVIDER_SITE_OTHER): Payer: Medicare Other | Admitting: *Deleted

## 2013-11-21 DIAGNOSIS — Z7901 Long term (current) use of anticoagulants: Secondary | ICD-10-CM

## 2013-11-21 DIAGNOSIS — I6350 Cerebral infarction due to unspecified occlusion or stenosis of unspecified cerebral artery: Secondary | ICD-10-CM

## 2013-11-21 DIAGNOSIS — I639 Cerebral infarction, unspecified: Secondary | ICD-10-CM

## 2013-11-21 DIAGNOSIS — Z5181 Encounter for therapeutic drug level monitoring: Secondary | ICD-10-CM

## 2013-11-21 DIAGNOSIS — I4891 Unspecified atrial fibrillation: Secondary | ICD-10-CM

## 2013-11-21 DIAGNOSIS — I480 Paroxysmal atrial fibrillation: Secondary | ICD-10-CM

## 2013-11-21 LAB — POCT INR: INR: 2.3

## 2013-12-13 ENCOUNTER — Encounter: Payer: Self-pay | Admitting: Cardiology

## 2013-12-15 ENCOUNTER — Encounter: Payer: Medicare Other | Admitting: Cardiology

## 2013-12-15 DIAGNOSIS — N183 Chronic kidney disease, stage 3 unspecified: Secondary | ICD-10-CM | POA: Insufficient documentation

## 2013-12-15 DIAGNOSIS — I5032 Chronic diastolic (congestive) heart failure: Secondary | ICD-10-CM | POA: Insufficient documentation

## 2013-12-15 DIAGNOSIS — I739 Peripheral vascular disease, unspecified: Secondary | ICD-10-CM | POA: Insufficient documentation

## 2013-12-22 ENCOUNTER — Telehealth: Payer: Self-pay | Admitting: Cardiology

## 2013-12-22 NOTE — Telephone Encounter (Signed)
Informed son that red spots on legs could be coming from coumadin.  Told him pt's in [facility] have their coumadin managed [facility] and they should be monitoring it appropriately.  Advised him to call our office when pt is D/C from Nursing Cent to make make F/U INR appt with us.  He verbalized understanding.

## 2014-01-06 ENCOUNTER — Encounter: Payer: Self-pay | Admitting: Cardiology

## 2014-01-06 ENCOUNTER — Ambulatory Visit (INDEPENDENT_AMBULATORY_CARE_PROVIDER_SITE_OTHER): Payer: Medicare Other | Admitting: Cardiology

## 2014-01-06 VITALS — BP 151/65 | HR 66 | Ht 66.0 in | Wt 165.0 lb

## 2014-01-06 DIAGNOSIS — S98131S Complete traumatic amputation of one right lesser toe, sequela: Secondary | ICD-10-CM

## 2014-01-06 DIAGNOSIS — I10 Essential (primary) hypertension: Secondary | ICD-10-CM

## 2014-01-06 DIAGNOSIS — R0989 Other specified symptoms and signs involving the circulatory and respiratory systems: Secondary | ICD-10-CM

## 2014-01-06 DIAGNOSIS — I639 Cerebral infarction, unspecified: Secondary | ICD-10-CM

## 2014-01-06 DIAGNOSIS — R9430 Abnormal result of cardiovascular function study, unspecified: Secondary | ICD-10-CM

## 2014-01-06 DIAGNOSIS — I5032 Chronic diastolic (congestive) heart failure: Secondary | ICD-10-CM

## 2014-01-06 DIAGNOSIS — I482 Chronic atrial fibrillation, unspecified: Secondary | ICD-10-CM

## 2014-01-06 DIAGNOSIS — Z7901 Long term (current) use of anticoagulants: Secondary | ICD-10-CM

## 2014-01-06 NOTE — Assessment & Plan Note (Signed)
She has chronic diastolic CHF. She had an acute episode in the hospital. This is now stable. No change in therapy.

## 2014-01-06 NOTE — Assessment & Plan Note (Signed)
She is continued on warfarin at this time. No change in therapy.

## 2014-01-06 NOTE — Progress Notes (Signed)
Patient ID: [name], female   DOB: 12/04/1934, 79 y.o.   MRN: [id number]    HPI  The patient is seen today to follow-up a history of paroxysmal atrial fibrillation. She is also seen follow-up hospitalization at [facility]. As part of today's evaluation I have obtained the records from [facility] and I have reviewed them at length. I have updated the current electronic medical record. The patient was admitted with cellulitis. She had significant infection and it was being treated aggressively. She had other stressing factors including hemoglobin of 8.3. Ultimately she developed congestive heart failure. There was a slight troponin bump. Her 2-D echo revealed an ejection fraction of 50-55%. She improved over time. She is now in a rehabilitation center continuing to improve from the viewpoint of her leg. She is not having any significant chest pain.  No Known Allergies  Current Outpatient Prescriptions  Medication Sig Dispense Refill  . amitriptyline (ELAVIL) 100 MG tablet Take 100 mg by mouth at bedtime.    Marland Kitchen. diltiazem (CARTIA XT) 120 MG 24 hr capsule Take 120 mg by mouth daily.    Marland Kitchen. glyBURIDE (DIABETA) 5 MG tablet Take 10 mg by mouth 2 (two) times daily with a meal.     . lisinopril-hydrochlorothiazide (PRINZIDE,ZESTORETIC) 20-12.5 MG per tablet Take 2 tablets by mouth every morning.     . metFORMIN (GLUCOPHAGE) 850 MG tablet Take 2,550 mg by mouth daily.    . simvastatin (ZOCOR) 20 MG tablet Take 20 mg by mouth at bedtime.       No current facility-administered medications for this visit.    History   Social History  . Marital Status: Divorced    Spouse Name: N/A    Number of Children: N/A  . Years of Education: N/A   Occupational History  . Not on file.   Social History Main Topics  . Smoking status: Never Smoker   . Smokeless tobacco: Never Used  . Alcohol Use: Not on file  . Drug Use: Not on file  . Sexual Activity: Not on file   Other Topics Concern  .  Not on file   Social History Narrative    Family History  Problem Relation Age of Onset  . Coronary artery disease      FAMILY H/O    Past Medical History  Diagnosis Date  . Paroxysmal atrial fibrillation     CHADS2 (4). Coumadin Rx  . Hypertension   . Stroke     dysarthria, resolved 2009  . Renal insufficiency     chronic  . Dyslipidemia   . Peripheral neuropathy   . Diabetes mellitus   . Amputation, traumatic, toes     two toes right foot  . Cataract 2011  . Ejection fraction     EF 60-65% 09/2007, echo  . Warfarin anticoagulation     Atrial fib  . Vertigo     Vertigo with a fall 03/2012    Past Surgical History  Procedure Laterality Date  . Appendectomy    . Cholecystectomy    . Toe amputation      RIGHT    Patient Active Problem List   Diagnosis Date Noted  . Chronic diastolic CHF (congestive heart failure) 12/15/2013  . PAD (peripheral artery disease) 12/15/2013  . CKD (chronic kidney disease) stage 3, GFR 30-59 ml/min 12/15/2013  . Encounter for therapeutic drug monitoring 04/18/2013  . Vertigo   . Paroxysmal atrial fibrillation   . Hypertension   . Stroke   .  Dyslipidemia   . Peripheral neuropathy   . Diabetes mellitus   . Amputation, traumatic, toes   . Ejection fraction   . Warfarin anticoagulation     ROS Patient denies fever, chills, headache, sweats, rash, change in vision, change in hearing, chest pain, cough, nausea or vomiting, urinary symptoms. All other systems are reviewed and are negative.   PHYSICAL EXAM Patient is oriented to person time and place. Affect is normal. She has a walking boot on her left leg. Head is atraumatic. Sclera and conjunctiva are normal. Lungs are clear. Respiratory effort is not labored. Cardiac exam reveals S1 and S2. Abdomen is soft. There is no peripheral edema in the right leg. Her left leg is wrapped in her boot. There are no skin rashes.  Filed Vitals:   01/06/14 1415  BP: 151/65  Pulse: 66  Height:  5\' 6"  (1.676 m)  Weight: 165 lb (74.844 kg)  SpO2: 97%     ASSESSMENT & PLAN

## 2014-01-06 NOTE — Assessment & Plan Note (Signed)
She had some toe amputations because of infection related to her cellulitis. This is stabilizing.

## 2014-01-06 NOTE — Assessment & Plan Note (Signed)
Blood pressures controlled. No change in therapy. 

## 2014-01-06 NOTE — Assessment & Plan Note (Signed)
Ejection fraction in the hospital 55%. At this time I am not inclined to pursue an ischemic workup. She'll be followed carefully.  As part of today's evaluation I spent greater than 25 minutes with a total care. More than half of this time was involved with direct counseling with the patient. We discussed her hospitalization at length. I explained to her the basis of her decompensation in the hospital and then stabilization.

## 2014-01-06 NOTE — Assessment & Plan Note (Signed)
There is chronic atrial fibrillation. Her rate was increased when she was sick in the hospital. It is now under better control. She is anticoagulated.

## 2014-01-06 NOTE — Patient Instructions (Signed)
Your physician recommends that you schedule a follow-up appointment in: 3 months. Your physician recommends that you continue on your current medications as directed. Please refer to the Current Medication list given to you today. 

## 2014-01-21 ENCOUNTER — Encounter: Payer: Medicare Other | Admitting: Vascular Surgery

## 2014-01-21 ENCOUNTER — Encounter: Payer: Self-pay | Admitting: Vascular Surgery

## 2014-01-22 ENCOUNTER — Ambulatory Visit (INDEPENDENT_AMBULATORY_CARE_PROVIDER_SITE_OTHER): Payer: Medicare Other | Admitting: Vascular Surgery

## 2014-01-22 ENCOUNTER — Ambulatory Visit (HOSPITAL_COMMUNITY)
Admission: RE | Admit: 2014-01-22 | Discharge: 2014-01-22 | Disposition: A | Discharge status: Home / Self Care | Payer: Medicare Other | Source: Ambulatory Visit | Attending: Vascular Surgery | Admitting: Vascular Surgery

## 2014-01-22 ENCOUNTER — Other Ambulatory Visit: Payer: Self-pay

## 2014-01-22 ENCOUNTER — Encounter: Payer: Self-pay | Admitting: Vascular Surgery

## 2014-01-22 VITALS — BP 195/86 | HR 92 | Resp 18 | Ht 66.0 in | Wt 172.0 lb

## 2014-01-22 DIAGNOSIS — I70209 Unspecified atherosclerosis of native arteries of extremities, unspecified extremity: Secondary | ICD-10-CM

## 2014-01-22 DIAGNOSIS — L98499 Non-pressure chronic ulcer of skin of other sites with unspecified severity: Secondary | ICD-10-CM | POA: Diagnosis not present

## 2014-01-22 DIAGNOSIS — I739 Peripheral vascular disease, unspecified: Secondary | ICD-10-CM | POA: Diagnosis present

## 2014-01-22 DIAGNOSIS — I70269 Atherosclerosis of native arteries of extremities with gangrene, unspecified extremity: Secondary | ICD-10-CM

## 2014-01-22 DIAGNOSIS — I639 Cerebral infarction, unspecified: Secondary | ICD-10-CM

## 2014-01-22 NOTE — Progress Notes (Signed)
Patient ID: [name], female   DOB: 10/12/1934, 79 y.o.   MRN: [id number]  Reason for Consult: diabetic foot infection left foot.   Referred by Dr. Mills KollerHarold Nichols  Subjective:     HPI:  [name] is a 79 y.o. female who was referred for evaluation of a diabetic foot infection of the left foot.  I have reviewed her records from [facility]. She is noted to have a Wagner grade 4 diabetic foot ulcer involving the calyx and the second digit of the left foot. In September, she had presented with an abscess in the left foot involving the calyx and the second digit. She underwent incision and drainage of the wound which involved a reamputation of the first and second digits of the left foot. Given the concern for ischemia she was referred for vascular consultation.  She does not remember exactly how the wound developed but this began in September. She has undergone aggressive wound care after the abscess was drained and has shown no signs of healing. Prior to developing this wound she did have bilateral lower extremity claudication. She was living at home and was ambulatory with a walker or a cane. She also describes some rest pain in both lower extremities.  Her cardiologist is Dr. Jerral BonitoJeff Katz. Her primary care physician is Dr. Neita CarpSasser.  This patient has a history of atrial fibrillation and is on Coumadin. In addition she has a history of chronic kidney disease. In her records from [facility] I see a creatinine of 2.29.   Past Medical History  Diagnosis Date  . Paroxysmal atrial fibrillation     CHADS2 (4). Coumadin Rx  . Hypertension   . Stroke     dysarthria, resolved 2009  . Renal insufficiency     chronic  . Dyslipidemia   . Peripheral neuropathy   . Diabetes mellitus   . Amputation, traumatic, toes     two toes right foot  . Cataract 2011  . Ejection fraction     EF 60-65% 09/2007, echo  . Warfarin anticoagulation     Atrial  fib  . Vertigo     Vertigo with a fall 03/2012   Family History  Problem Relation Age of Onset  . Coronary artery disease      FAMILY H/O   Past Surgical History  Procedure Laterality Date  . Appendectomy    . Cholecystectomy    . Toe amputation      RIGHT    Short Social History:  History  Substance Use Topics  . Smoking status: Never Smoker   . Smokeless tobacco: Never Used  . Alcohol Use: No    No Known Allergies  Current Outpatient Prescriptions  Medication Sig Dispense Refill  . ALPRAZolam (XANAX) 0.25 MG tablet Take 0.25 mg by mouth at bedtime as needed for anxiety.    Marland Kitchen. amitriptyline (ELAVIL) 50 MG tablet Take 50 mg by mouth at bedtime.    . calcium-vitamin D (OSCAL WITH D) 500-200 MG-UNIT per tablet Take 1 tablet by mouth 2 (two) times daily.    . cephALEXin (KEFLEX) 500 MG capsule Take 500 mg by mouth 2 (two) times daily.    . cloNIDine (CATAPRES) 0.1 MG tablet Take 0.1 mg by mouth 2 (two) times daily.    . furosemide (LASIX) 40 MG tablet Take 20 mg by mouth. 50mg  daily    . hydrALAZINE (APRESOLINE) 50 MG tablet Take 50 mg by mouth 3 (  three) times daily.    . insulin lispro (HUMALOG) 100 UNIT/ML injection Inject into the skin. Sliding scale 4 x day    . ipratropium-albuterol (DUONEB) 0.5-2.5 (3) MG/3ML SOLN Take 3 mLs by nebulization every 6 (six) hours as needed.    . iron polysaccharides (NIFEREX) 150 MG capsule Take 150 mg by mouth daily.    . isosorbide mononitrate (IMDUR) 60 MG 24 hr tablet Take 60 mg by mouth 2 (two) times daily.    . metoprolol succinate (TOPROL-XL) 50 MG 24 hr tablet Take 50 mg by mouth every morning. Take with or immediately following a meal.    . Multiple Vitamin (MULTIVITAMIN) tablet Take 1 tablet by mouth daily.    . vitamin B-12 (CYANOCOBALAMIN) 500 MCG tablet Take 500 mcg by mouth daily.    . vitamin C (ASCORBIC ACID) 500 MG tablet Take 500 mg by mouth daily.    . warfarin (COUMADIN) 3 MG tablet Take 3 mg by mouth daily.    . Zinc 50  MG TABS Take 1 tablet by mouth daily.     No current facility-administered medications for this visit.    Review of Systems  Constitutional: Negative for chills and fever.  Eyes: Negative for loss of vision.  Respiratory: Negative for cough and wheezing.  Cardiovascular: Positive for claudication, dyspnea with exertion, irregular heartbeat, leg swelling and orthopnea. Negative for chest pain, chest tightness and palpitations.  GI: Negative for blood in stool and vomiting.  GU: Negative for dysuria and hematuria.  Musculoskeletal: Positive for joint pain. Negative for leg pain and myalgias.  Skin: Negative for rash and wound.  Neurological: Negative for dizziness and speech difficulty.  Hematologic: Negative for bruises/bleeds easily. Psychiatric: Negative for depressed mood.        Objective:  Objective  Filed Vitals:   01/22/14 1322  BP: 195/86  Pulse: 92  Resp: 18  Height: 5' 6" (1.676 m)  Weight: 172 lb (78.019 kg)   Body mass index is 27.77 kg/(m^2).  Physical Exam  Constitutional: She is oriented to person, place, and time. She appears well-developed and well-nourished.  HENT:  Head: Normocephalic and atraumatic.  Neck: Neck supple. No JVD present. No thyromegaly present.  Cardiovascular: Normal rate, regular rhythm and normal heart sounds.  Exam reveals no friction rub.   No murmur heard. Pulses:      Femoral pulses are 2+ on the right side, and 2+ on the left side.      Popliteal pulses are 0 on the right side, and 0 on the left side.       Dorsalis pedis pulses are 0 on the right side, and 0 on the left side.       Posterior tibial pulses are 0 on the right side, and 0 on the left side.  I do not detect carotid bruits  Pulmonary/Chest: Breath sounds normal. She has no wheezes. She has no rales.  Abdominal: Soft. Bowel sounds are normal. There is no tenderness.  I do not palpate an aneurysm.  Musculoskeletal: Normal range of motion. She exhibits no edema.    Lymphadenopathy:    She has no cervical adenopathy.  Neurological: She is alert and oriented to person, place, and time. She has normal strength. No sensory deficit.  Skin: No lesion and no rash noted.  She has an extensive wound on the medial aspect of her left foot which measures 7 cm x 4 cm. Using a Q-tip there appears to be some exposed bone. There is   a fair amount of nonviable tissue overlying the wound. There is poor granulation tissue.  Psychiatric: She has a normal mood and affect.    Data: I reviewed an arterial Doppler study that was dated 11/26/2013. This noted that her arteries were not compressible. She had monophasic blunted waveforms in bilateral posterior tibial and dorsalis pedis arteries.  I have reviewed an x-ray of her left foot dated 11/25/2013. This noted some air within the soft tissues concerning for infection. There was no clear evidence of osteomyelitis.   ARTERIAL DUPLEX LEFT LOWER EXTREMITY: I have independently interpreted the duplex scan that was done today of the left lower extremity. There appears to be diffuse infrainguinal arterial occlusive disease. There is an area of focal increased velocities in the mid superficial femoral artery. There is also diffuse tibial artery occlusive disease.      Assessment/Plan:     DIABETIC FOOT INFECTION WITH INFRAINGUINAL ARTERIAL OCCLUSIVE DISEASE: This patient has an extensive wound of the left foot with some exposed bone and poor granulation. Based on her exam she has evidence of infrainguinal arterial occlusive disease. This combined with her diabetes puts her at very high risk for limb loss. I think that without revascularization she would require an above-the-knee amputation. Her only chance for limb salvage would be revascularization. However, given her age and multiple medical comorbidities she would be at very high-risk for surgery. She has a history of congestive heart failure, COPD (and is now on oxygen), diabetes,  poorly controlled blood pressure, and a history of a previous stroke.   I have explained to the patient and her daughter that there is only a small chance that we will find disease amenable to angioplasty and that we are limited by having to use CO2. Have explained that the safest approach overall would be primary amputation electively. However they feel strongly about proceeding at least with a CO2 arteriogram to see if there are any options for limb salvage. For this reason we will stop her Coumadin today and this has been scheduled for 01/26/2014. If she has disease amenable to angioplasty we could potentially consider this. However I do not think she would be a candidate for a tibial bypass if that is her only option. If that were the case she would best be served with primary amputation.  I have reviewed with the patient the indications for arteriography. In addition, I have reviewed the potential complications of arteriography including but not limited to: Bleeding, arterial injury, arterial thrombosis, dye action, renal insufficiency, or other unpredictable medical problems. I have explained to the patient that if we find disease amenable to angioplasty we could potentially address this at the same time. I have discussed the potential complications of angioplasty and stenting, including but not limited to: Bleeding, arterial thrombosis, arterial injury, dissection, or the need for surgical intervention.       Chuck Hinthristopher S Trannie Bardales MD [facility]

## 2014-01-25 MED ORDER — SODIUM CHLORIDE 0.9 % IV SOLN
INTRAVENOUS | Status: DC
  Administered 2014-01-26: 08:00:00 via INTRAVENOUS

## 2014-01-26 ENCOUNTER — Inpatient Hospital Stay (HOSPITAL_COMMUNITY)
Admission: RE | Admit: 2014-01-26 | Discharge: 2014-01-30 | DRG: 291 | Disposition: A | Discharge status: Other Acute Inpatient Hospital | Payer: Medicare Other | Source: Ambulatory Visit | Attending: Internal Medicine | Admitting: Internal Medicine

## 2014-01-26 ENCOUNTER — Encounter (HOSPITAL_COMMUNITY)
Admission: RE | Disposition: A | Discharge status: Nursing Home (Includes ICF) | Payer: Self-pay | Source: Ambulatory Visit | Attending: Vascular Surgery

## 2014-01-26 ENCOUNTER — Encounter: Payer: Self-pay | Admitting: Cardiology

## 2014-01-26 DIAGNOSIS — E1169 Type 2 diabetes mellitus with other specified complication: Secondary | ICD-10-CM | POA: Diagnosis present

## 2014-01-26 DIAGNOSIS — H9190 Unspecified hearing loss, unspecified ear: Secondary | ICD-10-CM | POA: Diagnosis present

## 2014-01-26 DIAGNOSIS — I482 Chronic atrial fibrillation: Secondary | ICD-10-CM | POA: Diagnosis present

## 2014-01-26 DIAGNOSIS — I480 Paroxysmal atrial fibrillation: Secondary | ICD-10-CM | POA: Diagnosis present

## 2014-01-26 DIAGNOSIS — N179 Acute kidney failure, unspecified: Secondary | ICD-10-CM | POA: Diagnosis present

## 2014-01-26 DIAGNOSIS — I509 Heart failure, unspecified: Secondary | ICD-10-CM

## 2014-01-26 DIAGNOSIS — I96 Gangrene, not elsewhere classified: Secondary | ICD-10-CM | POA: Diagnosis present

## 2014-01-26 DIAGNOSIS — G9340 Encephalopathy, unspecified: Secondary | ICD-10-CM | POA: Diagnosis present

## 2014-01-26 DIAGNOSIS — D649 Anemia, unspecified: Secondary | ICD-10-CM | POA: Diagnosis present

## 2014-01-26 DIAGNOSIS — N183 Chronic kidney disease, stage 3 unspecified: Secondary | ICD-10-CM | POA: Diagnosis present

## 2014-01-26 DIAGNOSIS — N189 Chronic kidney disease, unspecified: Secondary | ICD-10-CM | POA: Diagnosis present

## 2014-01-26 DIAGNOSIS — E11649 Type 2 diabetes mellitus with hypoglycemia without coma: Secondary | ICD-10-CM | POA: Diagnosis present

## 2014-01-26 DIAGNOSIS — L089 Local infection of the skin and subcutaneous tissue, unspecified: Secondary | ICD-10-CM | POA: Diagnosis present

## 2014-01-26 DIAGNOSIS — Z89421 Acquired absence of other right toe(s): Secondary | ICD-10-CM | POA: Diagnosis not present

## 2014-01-26 DIAGNOSIS — E08621 Diabetes mellitus due to underlying condition with foot ulcer: Secondary | ICD-10-CM | POA: Diagnosis present

## 2014-01-26 DIAGNOSIS — Z794 Long term (current) use of insulin: Secondary | ICD-10-CM | POA: Diagnosis not present

## 2014-01-26 DIAGNOSIS — J449 Chronic obstructive pulmonary disease, unspecified: Secondary | ICD-10-CM | POA: Diagnosis present

## 2014-01-26 DIAGNOSIS — E785 Hyperlipidemia, unspecified: Secondary | ICD-10-CM | POA: Diagnosis present

## 2014-01-26 DIAGNOSIS — E1151 Type 2 diabetes mellitus with diabetic peripheral angiopathy without gangrene: Secondary | ICD-10-CM | POA: Diagnosis present

## 2014-01-26 DIAGNOSIS — E11621 Type 2 diabetes mellitus with foot ulcer: Secondary | ICD-10-CM | POA: Diagnosis present

## 2014-01-26 DIAGNOSIS — G629 Polyneuropathy, unspecified: Secondary | ICD-10-CM | POA: Diagnosis present

## 2014-01-26 DIAGNOSIS — Z79899 Other long term (current) drug therapy: Secondary | ICD-10-CM | POA: Diagnosis not present

## 2014-01-26 DIAGNOSIS — I639 Cerebral infarction, unspecified: Secondary | ICD-10-CM

## 2014-01-26 DIAGNOSIS — I130 Hypertensive heart and chronic kidney disease with heart failure and stage 1 through stage 4 chronic kidney disease, or unspecified chronic kidney disease: Secondary | ICD-10-CM | POA: Diagnosis present

## 2014-01-26 DIAGNOSIS — Z7901 Long term (current) use of anticoagulants: Secondary | ICD-10-CM

## 2014-01-26 DIAGNOSIS — I739 Peripheral vascular disease, unspecified: Secondary | ICD-10-CM | POA: Diagnosis present

## 2014-01-26 DIAGNOSIS — E1165 Type 2 diabetes mellitus with hyperglycemia: Secondary | ICD-10-CM | POA: Diagnosis present

## 2014-01-26 DIAGNOSIS — Z8249 Family history of ischemic heart disease and other diseases of the circulatory system: Secondary | ICD-10-CM | POA: Diagnosis not present

## 2014-01-26 DIAGNOSIS — E0852 Diabetes mellitus due to underlying condition with diabetic peripheral angiopathy with gangrene: Secondary | ICD-10-CM | POA: Diagnosis present

## 2014-01-26 DIAGNOSIS — I70269 Atherosclerosis of native arteries of extremities with gangrene, unspecified extremity: Secondary | ICD-10-CM | POA: Diagnosis present

## 2014-01-26 DIAGNOSIS — R0602 Shortness of breath: Secondary | ICD-10-CM

## 2014-01-26 DIAGNOSIS — I5033 Acute on chronic diastolic (congestive) heart failure: Principal | ICD-10-CM | POA: Diagnosis present

## 2014-01-26 DIAGNOSIS — L97509 Non-pressure chronic ulcer of other part of unspecified foot with unspecified severity: Secondary | ICD-10-CM | POA: Diagnosis present

## 2014-01-26 DIAGNOSIS — Z8673 Personal history of transient ischemic attack (TIA), and cerebral infarction without residual deficits: Secondary | ICD-10-CM | POA: Diagnosis not present

## 2014-01-26 DIAGNOSIS — I70242 Atherosclerosis of native arteries of left leg with ulceration of calf: Secondary | ICD-10-CM | POA: Diagnosis not present

## 2014-01-26 LAB — POCT I-STAT, CHEM 8
BUN: 46 mg/dL — ABNORMAL HIGH (ref 6–23)
CALCIUM ION: 1.26 mmol/L (ref 1.13–1.30)
CREATININE: 2.6 mg/dL — AB (ref 0.50–1.10)
Chloride: 102 mEq/L (ref 96–112)
Glucose, Bld: 125 mg/dL — ABNORMAL HIGH (ref 70–99)
HCT: 28 % — ABNORMAL LOW (ref 36.0–46.0)
HEMOGLOBIN: 9.5 g/dL — AB (ref 12.0–15.0)
Potassium: 3.9 mEq/L (ref 3.7–5.3)
Sodium: 143 mEq/L (ref 137–147)
TCO2: 30 mmol/L (ref 0–100)

## 2014-01-26 LAB — PROTIME-INR
INR: 1.41 (ref 0.00–1.49)
PROTHROMBIN TIME: 17.4 s — AB (ref 11.6–15.2)

## 2014-01-26 LAB — GLUCOSE, CAPILLARY
GLUCOSE-CAPILLARY: 145 mg/dL — AB (ref 70–99)
GLUCOSE-CAPILLARY: 186 mg/dL — AB (ref 70–99)
Glucose-Capillary: 144 mg/dL — ABNORMAL HIGH (ref 70–99)

## 2014-01-26 LAB — HEPARIN LEVEL (UNFRACTIONATED): HEPARIN UNFRACTIONATED: 0.2 [IU]/mL — AB (ref 0.30–0.70)

## 2014-01-26 SURGERY — ABDOMINAL AORTAGRAM
Anesthesia: LOCAL

## 2014-01-26 MED ORDER — HYDRALAZINE HCL 50 MG PO TABS
50.0000 mg | ORAL_TABLET | Freq: Three times a day (TID) | ORAL | Status: DC
  Administered 2014-01-27 – 2014-01-30 (×9): 50 mg via ORAL
  Filled 2014-01-26 (×13): qty 1

## 2014-01-26 MED ORDER — SODIUM CHLORIDE 0.9 % IV SOLN: 250.0000 mL | INTRAVENOUS | Status: DC | PRN

## 2014-01-26 MED ORDER — ISOSORBIDE MONONITRATE ER 60 MG PO TB24: 60.0000 mg | ORAL_TABLET | Freq: Two times a day (BID) | ORAL | Status: DC

## 2014-01-26 MED ORDER — CARVEDILOL 6.25 MG PO TABS
6.2500 mg | ORAL_TABLET | Freq: Two times a day (BID) | ORAL | Status: DC
  Administered 2014-01-26 – 2014-01-30 (×7): 6.25 mg via ORAL
  Filled 2014-01-26 (×11): qty 1

## 2014-01-26 MED ORDER — ZINC SULFATE 220 (50 ZN) MG PO CAPS
220.0000 mg | ORAL_CAPSULE | Freq: Every day | ORAL | Status: DC
  Administered 2014-01-27 – 2014-01-30 (×4): 220 mg via ORAL
  Filled 2014-01-26 (×5): qty 1

## 2014-01-26 MED ORDER — IPRATROPIUM BROMIDE 0.02 % IN SOLN
RESPIRATORY_TRACT | Status: AC
  Administered 2014-01-26: 0.5 mg
  Filled 2014-01-26: qty 2.5

## 2014-01-26 MED ORDER — SODIUM CHLORIDE 0.9 % IJ SOLN
3.0000 mL | Freq: Two times a day (BID) | INTRAMUSCULAR | Status: DC
  Administered 2014-01-29 (×2): 3 mL via INTRAVENOUS

## 2014-01-26 MED ORDER — VITAMIN C 500 MG PO TABS
500.0000 mg | ORAL_TABLET | Freq: Every day | ORAL | Status: DC
  Administered 2014-01-27 – 2014-01-30 (×4): 500 mg via ORAL
  Filled 2014-01-26 (×5): qty 1

## 2014-01-26 MED ORDER — ACETAMINOPHEN 650 MG RE SUPP: 325.0000 mg | RECTAL | Status: DC | PRN

## 2014-01-26 MED ORDER — FUROSEMIDE 40 MG PO TABS: 40.0000 mg | ORAL_TABLET | Freq: Every day | ORAL | Status: DC

## 2014-01-26 MED ORDER — CYANOCOBALAMIN 500 MCG PO TABS
500.0000 ug | ORAL_TABLET | Freq: Every day | ORAL | Status: DC
  Administered 2014-01-27 – 2014-01-30 (×4): 500 ug via ORAL
  Filled 2014-01-26 (×5): qty 1

## 2014-01-26 MED ORDER — POLYSACCHARIDE IRON COMPLEX 150 MG PO CAPS
150.0000 mg | ORAL_CAPSULE | Freq: Two times a day (BID) | ORAL | Status: DC
  Administered 2014-01-27 – 2014-01-30 (×7): 150 mg via ORAL
  Filled 2014-01-26 (×9): qty 1

## 2014-01-26 MED ORDER — IPRATROPIUM-ALBUTEROL 0.5-2.5 (3) MG/3ML IN SOLN: 3.0000 mL | Freq: Four times a day (QID) | RESPIRATORY_TRACT | Status: DC | PRN

## 2014-01-26 MED ORDER — INSULIN ASPART 100 UNIT/ML ~~LOC~~ SOLN
0.0000 [IU] | Freq: Three times a day (TID) | SUBCUTANEOUS | Status: DC
Start: 2014-01-26 — End: 2014-01-30
  Administered 2014-01-27 – 2014-01-28 (×4): 3 [IU] via SUBCUTANEOUS
  Administered 2014-01-29 (×3): 2 [IU] via SUBCUTANEOUS
  Administered 2014-01-30: 3 [IU] via SUBCUTANEOUS
  Administered 2014-01-30: 2 [IU] via SUBCUTANEOUS

## 2014-01-26 MED ORDER — HEPARIN (PORCINE) IN NACL 100-0.45 UNIT/ML-% IJ SOLN
1500.0000 [IU]/h | INTRAMUSCULAR | Status: DC
  Administered 2014-01-26: 1050 [IU]/h via INTRAVENOUS
  Administered 2014-01-29: 1500 [IU]/h via INTRAVENOUS
  Filled 2014-01-26 (×6): qty 250

## 2014-01-26 MED ORDER — WARFARIN SODIUM 6 MG PO TABS
6.0000 mg | ORAL_TABLET | Freq: Once | ORAL | Status: AC
  Administered 2014-01-26: 6 mg via ORAL
  Filled 2014-01-26 (×2): qty 1

## 2014-01-26 MED ORDER — VITAMIN B-12 500 MCG PO TABS: 500.0000 ug | ORAL_TABLET | Freq: Every day | ORAL | Status: DC

## 2014-01-26 MED ORDER — ALBUTEROL SULFATE (2.5 MG/3ML) 0.083% IN NEBU
INHALATION_SOLUTION | RESPIRATORY_TRACT | Status: AC
  Administered 2014-01-26: 2.5 mg
  Filled 2014-01-26: qty 3

## 2014-01-26 MED ORDER — ONDANSETRON HCL 4 MG/2ML IJ SOLN
4.0000 mg | Freq: Four times a day (QID) | INTRAMUSCULAR | Status: DC | PRN
  Administered 2014-01-26 – 2014-01-28 (×2): 4 mg via INTRAVENOUS
  Filled 2014-01-26 (×2): qty 2

## 2014-01-26 MED ORDER — ALPRAZOLAM 0.25 MG PO TABS: 0.2500 mg | ORAL_TABLET | Freq: Every evening | ORAL | Status: DC | PRN

## 2014-01-26 MED ORDER — METOPROLOL TARTRATE 1 MG/ML IV SOLN: 2.0000 mg | INTRAVENOUS | Status: DC | PRN

## 2014-01-26 MED ORDER — IPRATROPIUM-ALBUTEROL 0.5-2.5 (3) MG/3ML IN SOLN
3.0000 mL | Freq: Four times a day (QID) | RESPIRATORY_TRACT | Status: DC | PRN
Start: 2014-01-26 — End: 2014-01-26

## 2014-01-26 MED ORDER — HYDRALAZINE HCL 20 MG/ML IJ SOLN: 5.0000 mg | INTRAMUSCULAR | Status: DC | PRN

## 2014-01-26 MED ORDER — COLLAGENASE 250 UNIT/GM EX OINT
1.0000 "application " | TOPICAL_OINTMENT | Freq: Every day | CUTANEOUS | Status: DC
  Administered 2014-01-26 – 2014-01-30 (×4): 1 via TOPICAL
  Filled 2014-01-26: qty 30

## 2014-01-26 MED ORDER — SODIUM CHLORIDE 0.9 % IJ SOLN: 3.0000 mL | INTRAMUSCULAR | Status: DC | PRN

## 2014-01-26 MED ORDER — LABETALOL HCL 5 MG/ML IV SOLN
10.0000 mg | INTRAVENOUS | Status: DC | PRN
  Filled 2014-01-26: qty 4

## 2014-01-26 MED ORDER — ACETAMINOPHEN 325 MG PO TABS
325.0000 mg | ORAL_TABLET | ORAL | Status: DC | PRN
  Administered 2014-01-28: 650 mg via ORAL
  Administered 2014-01-29: 325 mg via ORAL
  Filled 2014-01-26: qty 2
  Filled 2014-01-26: qty 1
  Filled 2014-01-26: qty 2

## 2014-01-26 MED ORDER — HYDRALAZINE HCL 25 MG PO TABS: 25.0000 mg | ORAL_TABLET | Freq: Three times a day (TID) | ORAL | Status: DC

## 2014-01-26 MED ORDER — ISOSORBIDE DINITRATE 20 MG PO TABS
40.0000 mg | ORAL_TABLET | Freq: Three times a day (TID) | ORAL | Status: DC
  Administered 2014-01-27 – 2014-01-30 (×9): 40 mg via ORAL
  Filled 2014-01-26 (×13): qty 2

## 2014-01-26 MED ORDER — FUROSEMIDE 10 MG/ML IJ SOLN
INTRAMUSCULAR | Status: AC
  Administered 2014-01-26: 40 mg via INTRAVENOUS
  Filled 2014-01-26: qty 4

## 2014-01-26 MED ORDER — CLONIDINE HCL 0.1 MG PO TABS: 0.1000 mg | ORAL_TABLET | Freq: Two times a day (BID) | ORAL | Status: DC

## 2014-01-26 MED ORDER — CHOLECALCIFEROL 50 MCG (2000 UT) PO TABS: 2000.0000 [IU] | ORAL_TABLET | Freq: Every day | ORAL | Status: DC

## 2014-01-26 MED ORDER — GUAIFENESIN-DM 100-10 MG/5ML PO SYRP: 15.0000 mL | ORAL_SOLUTION | ORAL | Status: DC | PRN

## 2014-01-26 MED ORDER — AMITRIPTYLINE HCL 50 MG PO TABS
50.0000 mg | ORAL_TABLET | Freq: Every day | ORAL | Status: DC
  Administered 2014-01-27 – 2014-01-29 (×3): 50 mg via ORAL
  Filled 2014-01-26 (×5): qty 1

## 2014-01-26 MED ORDER — INSULIN ASPART 100 UNIT/ML ~~LOC~~ SOLN: 0.0000 [IU] | Freq: Three times a day (TID) | SUBCUTANEOUS | Status: DC

## 2014-01-26 MED ORDER — FUROSEMIDE 10 MG/ML IJ SOLN
40.0000 mg | Freq: Once | INTRAMUSCULAR | Status: AC
  Administered 2014-01-26: 40 mg via INTRAVENOUS

## 2014-01-26 MED ORDER — INSULIN LISPRO 100 UNIT/ML ~~LOC~~ SOLN: 15.0000 [IU] | Freq: Every day | SUBCUTANEOUS | Status: DC

## 2014-01-26 MED ORDER — FUROSEMIDE 40 MG PO TABS
40.0000 mg | ORAL_TABLET | Freq: Every day | ORAL | Status: DC
  Filled 2014-01-26: qty 1

## 2014-01-26 MED ORDER — ONE-DAILY MULTI VITAMINS PO TABS: 1.0000 | ORAL_TABLET | Freq: Every day | ORAL | Status: DC

## 2014-01-26 MED ORDER — VITAMIN D3 25 MCG (1000 UNIT) PO TABS
2000.0000 [IU] | ORAL_TABLET | Freq: Every day | ORAL | Status: DC
  Administered 2014-01-27 – 2014-01-30 (×4): 2000 [IU] via ORAL
  Filled 2014-01-26 (×4): qty 2

## 2014-01-26 MED ORDER — PHENOL 1.4 % MT LIQD
1.0000 | OROMUCOSAL | Status: DC | PRN
  Filled 2014-01-26: qty 177

## 2014-01-26 MED ORDER — PANTOPRAZOLE SODIUM 40 MG PO TBEC
40.0000 mg | DELAYED_RELEASE_TABLET | Freq: Every day | ORAL | Status: DC
  Administered 2014-01-27 – 2014-01-30 (×4): 40 mg via ORAL
  Filled 2014-01-26: qty 1

## 2014-01-26 MED ORDER — WARFARIN - PHARMACIST DOSING INPATIENT
Freq: Every day | Status: DC
  Administered 2014-01-26 – 2014-01-28 (×3)

## 2014-01-26 NOTE — Care Management Note (Addendum)
    Page 1 of 1   01/30/2014     2:23:46 PM CARE MANAGEMENT NOTE 01/30/2014  Patient:  [name],[name]   Account Number:  [id number]  Date Initiated:  01/26/2014  Documentation initiated by:  Donato SchultzHUTCHINSON,Kelli  Subjective/Objective Assessment:   for surgery, with the diagnosis of pvd with left foot ulcer      Action/Plan:   CM to follow for disposition needs   Anticipated DC Date:  01/30/2014   Anticipated DC Plan:  LONG TERM ACUTE CARE (LTAC)      DC Planning Services  CM consult      Choice offered to / List presented to:             Status of service:  Completed, signed off Medicare Important Message given?  YES (If response is "NO", the following Medicare IM given date fields will be blank) Date Medicare IM given:  01/30/2014 Medicare IM given by:  Aerik Polan Date Additional Medicare IM given:   Additional Medicare IM given by:    Discharge Disposition:  LONG TERM ACUTE CARE (LTAC)  Per UR Regulation:  Reviewed for med. necessity/level of care/duration of stay  If discussed at Long Length of Stay Meetings, dates discussed:    Comments:  01/30/14 Kelli AceJulie Aahan Marques, RN, BSN [phone] Pt [facility] today, per MD orders. [facility] with [name](ph [phone]) admissions liasion for facility.  Faxed MAR, dc summary to [name] at [phone].   Pt to go to room 214 at [facility], under care of [name].  Nurse to call report to [facility] at [phone].  COBRA form signed by MD; nurse to call [facility] for transport when pt ready for pickup.  Kelli Hutchinson RN, BSN, MSHL, CCM  Nurse - Case Manager,  ([id number])  [phone]  01/26/2014 CM will continue to monitor for d/c planning needs.

## 2014-01-26 NOTE — Interval H&P Note (Signed)
History and Physical Interval Note:  01/26/2014 9:56 AM  Silverio DecampMona P Emelda FearFerguson  has presented today for surgery, with the diagnosis of pvd with left foot ulcer  The various methods of treatment have been discussed with the patient and family. After consideration of risks, benefits and other options for treatment, the patient has consented to  Procedure(s): ABDOMINAL AORTAGRAM (N/A) as a surgical intervention .  The patient's history has been reviewed, patient examined, no change in status, stable for surgery.  I have reviewed the patient's chart and labs.  Questions were answered to the patient's satisfaction.     Angelene Rome S

## 2014-01-26 NOTE — Progress Notes (Signed)
Report called to [facility] RN. Transferred via stretcher.

## 2014-01-26 NOTE — Progress Notes (Signed)
ANTICOAGULATION CONSULT NOTE - Initial Consult  Pharmacy Consult for heparin and warfarin Indication: atrial fibrillation  Allergies  Allergen Reactions  . Ativan [Lorazepam] Other (See Comments)    Unknown  . Morphine And Related Other (See Comments)    MAR    Patient Measurements: Height: 5\' 6"  (167.6 cm) Weight: 168 lb (76.204 kg) IBW/kg (Calculated) : 59.3 Heparin Dosing Weight: 75 kg  Vital Signs: Temp: 97.7 F (36.5 C) (11/23 0723) Temp Source: Oral (11/23 0723) BP: 160/71 mmHg (11/23 1300) Pulse Rate: 84 (11/23 1300)  Labs:  Recent Labs  01/26/14 0742 01/26/14 0752  HGB  --  9.5*  HCT  --  28.0*  LABPROT 17.4*  --   INR 1.41  --   CREATININE  --  2.60*    Estimated Creatinine Clearance: 18.9 mL/min (by C-G formula based on Cr of 2.6).   Medical History: Past Medical History  Diagnosis Date  . Paroxysmal atrial fibrillation     CHADS2 (4). Coumadin Rx  . Hypertension   . Stroke     dysarthria, resolved 2009  . Renal insufficiency     chronic  . Dyslipidemia   . Peripheral neuropathy   . Diabetes mellitus   . Amputation, traumatic, toes     two toes right foot  . Cataract 2011  . Ejection fraction     EF 60-65% 09/2007, echo  . Warfarin anticoagulation     Atrial fib  . Vertigo     Vertigo with a fall 03/2012    Medications:  Prescriptions prior to admission  Medication Sig Dispense Refill Last Dose  . carvedilol (COREG) 6.25 MG tablet Take 6.25 mg by mouth 2 (two) times daily with a meal.     . Cholecalciferol 2000 UNITS TABS Take 2,000 Units by mouth daily.     . cloNIDine (CATAPRES) 0.1 MG tablet Take 0.1 mg by mouth 2 (two) times daily.     . collagenase (SANTYL) ointment Apply 1 application topically daily.     . furosemide (LASIX) 40 MG tablet Take 40 mg by mouth daily.     . hydrALAZINE (APRESOLINE) 25 MG tablet Take 25 mg by mouth 3 (three) times daily.     . insulin lispro (HUMALOG) 100 UNIT/ML injection Inject 15 Units into the  skin daily.    Taking  . iron polysaccharides (NIFEREX) 150 MG capsule Take 150 mg by mouth 2 (two) times daily.    Taking  . isosorbide mononitrate (IMDUR) 60 MG 24 hr tablet Take 60 mg by mouth 2 (two) times daily.     . metoprolol succinate (TOPROL-XL) 50 MG 24 hr tablet Take 50 mg by mouth every morning. Take with or immediately following a meal.     . Nutritional Supplements (BOOST PLUS PO) Take 240 mLs by mouth 3 (three) times daily.     . vitamin B-12 (CYANOCOBALAMIN) 500 MCG tablet Take 500 mcg by mouth daily.   Taking  . vitamin C (ASCORBIC ACID) 500 MG tablet Take 500 mg by mouth daily.   Taking  . warfarin (COUMADIN) 4 MG tablet Take 4 mg by mouth daily.   Past Week at Unknown time  . zinc sulfate 220 MG capsule Take 220 mg by mouth daily.     Marland Kitchen. ALPRAZolam (XANAX) 0.25 MG tablet Take 0.25 mg by mouth at bedtime as needed for anxiety.   Unk  . amitriptyline (ELAVIL) 50 MG tablet Take 50 mg by mouth at bedtime.     .Marland Kitchen  ipratropium-albuterol (DUONEB) 0.5-2.5 (3) MG/3ML SOLN Take 3 mLs by nebulization every 6 (six) hours as needed.   Unk  . Multiple Vitamin (MULTIVITAMIN) tablet Take 1 tablet by mouth daily.   Unk    Assessment: [age] y/o female who presented for CO2 angiogram today but this was cancelled due to inability to lay flat. She has a nonhealing diabetic foot infection with arterial occlusive disease for which limb salvage is being attempted. She also takes chronic warfarin for Afib. No bleeding noted, H/H are low, platelet count unavailable.  PTA: 4 mg daily (will confirm when med hx complete)  Goal of Therapy:  Heparin level 0.3-0.7 units/ml INR 2-2.5 per MD request Monitor platelets by anticoagulation protocol: Yes   Plan:  - Heparin drip at 1050 units/hr with no bolus - Warfarin 6 mg PO tonight - 8 hr heparin level and CBC - Heparin level, INR and CBC daily  [facility]ennifer [place], RoyaltonPharm.D., BCPS Clinical Pharmacist Pager: [phone] 01/26/2014 1:42 PM

## 2014-01-26 NOTE — Consult Note (Signed)
CARDIOLOGY CONSULT NOTE   Patient ID: [name] MRN: [id number] DOB/AGE: 08-03-1934 79 y.o.  Admit Date: 01/26/2014  Primary Physician: Estanislado Pandy, MD  Primary Cardiologist     [facility] Office  Clinical Summary [name] is a 79 y.o.female. She was here today for a possible CO2 angiogram by Dr. Edilia Bo. She was not able to lie flat. Some of this was from shortness of breath. I saw the patient in the angiography suite. She was stable sitting upright. Initially I thought we might be able to diuresis her in short stay and send her back to the nursing home. However after further review of her labs and her overall situation, I agree that she should be admitted to the hospital for the treatment of CHF and renal dysfunction.  I follow this patient as an outpatient. I saw her last in the evening office on January 06, 2014. At that time she was being seen after a hospitalization at [facility]. During that hospitalization she had had problems with cellulitis and a toe amputation. She did develop acute diastolic CHF during that hospitalization. Her echo showed an ejection fraction in the 50-55% range with no focal wall motion abnormalities. She did have a slight troponin bump that was felt related to her CHF. Other stress factors during that admission included a hemoglobin of 8.3. When I saw her in the office she was stable. I carefully reviewed the fact that she was not on an ongoing regular diuretic. I did not add a diuretic at that time.  As of today she does have rales and symptoms compatible with congestive heart failure. In addition her creatinine is 2.6. It had been elevated during her hospitalization at [facility] but it is higher now. Therefore she is admitted for further decision making by vascular surgery and for the treatment of CHF and renal dysfunction. Cardiology will oversee the treatment of her CHF and renal dysfunction.  Historically the patient is on Coumadin. She  had an event in 2009 with dysarthria. She has chronic atrial fibrillation. Anticoagulation will be continued as long as her anemia does not represent a severe problem.   Allergies  Allergen Reactions  . Ativan [Lorazepam] Other (See Comments)    Unknown  . Morphine And Related Other (See Comments)    MAR    Medications Scheduled Medications: . furosemide      . furosemide  40 mg Intravenous Once     Infusions: . sodium chloride 100 mL/hr at 01/26/14 0806     PRN Medications:     Past Medical History  Diagnosis Date  . Paroxysmal atrial fibrillation     CHADS2 (4). Coumadin Rx  . Hypertension   . Stroke     dysarthria, resolved 2009  . Renal insufficiency     chronic  . Dyslipidemia   . Peripheral neuropathy   . Diabetes mellitus   . Amputation, traumatic, toes     two toes right foot  . Cataract 2011  . Ejection fraction     EF 60-65% 09/2007, echo  . Warfarin anticoagulation     Atrial fib  . Vertigo     Vertigo with a fall 03/2012    Past Surgical History  Procedure Laterality Date  . Appendectomy    . Cholecystectomy    . Toe amputation      RIGHT    Family History  Problem Relation Age of Onset  . Coronary artery disease  FAMILY H/O    Social History [name] reports that she has never smoked. She has never used smokeless tobacco. [name] reports that she does not drink alcohol.  Review of Systems  Currently the patient is not complaining of any symptoms. However she clearly was short of breath when trying to lie down for her angiogram. Head is atraumatic. Sclera and conjunctiva are normal. She is overweight. Head is atraumatic. Sclera and conjunctiva are normal. There is jugular venous distention. She has diffuse rales. There is no respiratory distress sitting up. Cardiac exam reveals S1 and S2. The rhythm is irregularly irregular. The abdomen is soft. There is trace peripheral edema. She has kyphosis of the spine. Neurologic exam  was limited. She has a musculoskeletal deformity related to her peripheral arterial disease.  Physical Examination Blood pressure 179/77, pulse 83, temperature 97.7 F (36.5 C), temperature source Oral, resp. rate 20, height 5\' 6"  (1.676 m), weight 168 lb (76.204 kg), SpO2 96 %. No intake or output data in the 24 hours ending 01/26/14 1058    Prior Cardiac Testing/Procedures  Lab Results  Basic Metabolic Panel:  Recent Labs Lab 01/26/14 0752  NA 143  K 3.9  CL 102  GLUCOSE 125*  BUN 46*  CREATININE 2.60*    Liver Function Tests: No results for input(s): AST, ALT, ALKPHOS, BILITOT, PROT, ALBUMIN in the last 168 hours.  CBC:  Recent Labs Lab 01/26/14 0752  HGB 9.5*  HCT 28.0*    Cardiac Enzymes: No results for input(s): CKTOTAL, CKMB, CKMBINDEX, TROPONINI in the last 168 hours.  BNP: Invalid input(s): POCBNP   Radiology: No results found.  There is no EKG available yet for review. This will be obtained.   Impression and Recommendations:   The main issue at this time is acute on chronic diastolic CHF with worsening renal insufficiency. This will be treated as an inpatient.    Chronic atrial fibrillation     There is no EKG available yet for review.    Stroke     In 2009 the patient had some dysarthria. With her history of atrial fibrillation, it is important to keep her coumadinized if we can.    Diabetes mellitus     Her diabetes will be treated.    Warfarin anticoagulation     The patient has been on Coumadin. I presume it was held for this angiogram. INR today is 1.4. I will write orders for her to receive heparin and Coumadin.    CKD (chronic kidney disease) stage 3, GFR 30-59 ml/min      Creatinine is worse today at 2.6. This will be followed carefully as she is diuresis.    Atherosclerosis of native arteries of the extremities with gangrene     She has severe disease. Dr. Edilia Boickson will be working with the family about the approach to her care. At  this point, it seems that amputation may be necessary.    Acute on chronic diastolic CHF (congestive heart failure)     The patient is definitely wet today. I had considered trying to treat her as an outpatient when I first saw her in the vascular lab today. However I changed my mind. She needs to be diuresed in the hospital with careful attention to her renal function. When she goes home, she should go on an ongoing Lasix.    Acute on chronic renal failure      Creatinine is higher than it has been. We will follow her renal function  carefully as she is diuresis.    Anemia   Hemoglobin had been as low as 8.3 when [facility], [facility]. Hemoglobin today is 9.5. Hopefully her hemoglobin will remain stable as her anticoagulation for atrial fibrillation is continued.    Jerral BonitoJeff Katz, MD 01/26/2014, 10:58 AM

## 2014-01-26 NOTE — Progress Notes (Signed)
UR completed Daryn Hicks K. Damarko Stitely, RN, BSN, MSHL, CCM  01/26/2014 2:37 PM

## 2014-01-26 NOTE — Progress Notes (Unsigned)
Patient ID: [name], female   DOB: 11/14/1934, 79 y.o.   MRN: [id number]  I saw the patient today in the peripheral vascular angiography suite. She was here as an outpatient for possible CO2 angiogram. As she began to lie down she had increasing shortness of breath. Decision was made to cancel the procedure. Sitting up she is stable. She does have some rales on physical exam. I had seen the patient in the office recently. She was stable at that time. However she had been [facility] the [facility] of cellulitis. During that hospitalization she did have some CHF. She was not on a diuretic when I saw her in the office.  It now appears that she needs to be on an ongoing diuretic. I have spoken in person with Dr. Cari Carawayhris Dickson. She will receive one dose of IV Lasix to help her diurese while she is here today. She will then have an additional medication added to her outpatient meds. She will be started on Lasix 40 mg daily by mouth.  Jerral BonitoJeff Harlan Ervine, MD

## 2014-01-26 NOTE — H&P (View-Only) (Signed)
Patient ID: [name], female   DOB: 10/12/1934, 79 y.o.   MRN: [id number]  Reason for Consult: diabetic foot infection left foot.   Referred by Dr. Mills KollerHarold Nichols  Subjective:     HPI:  [name] is a 79 y.o. female who was referred for evaluation of a diabetic foot infection of the left foot.  I have reviewed her records from the [facility]. She is noted to have a Wagner grade 4 diabetic foot ulcer involving the calyx and the second digit of the left foot. In September, she had presented with an abscess in the left foot involving the calyx and the second digit. She underwent incision and drainage of the wound which involved a reamputation of the first and second digits of the left foot. Given the concern for ischemia she was referred for vascular consultation.  She does not remember exactly how the wound developed but this began in September. She has undergone aggressive wound care after the abscess was drained and has shown no signs of healing. Prior to developing this wound she did have bilateral lower extremity claudication. She was living at home and was ambulatory with a walker or a cane. She also describes some rest pain in both lower extremities.  Her cardiologist is Dr. Jerral BonitoJeff Katz. Her primary care physician is Dr. Neita CarpSasser.  This patient has a history of atrial fibrillation and is on Coumadin. In addition she has a history of chronic kidney disease. In her records from [facility] I see a creatinine of 2.29.   Past Medical History  Diagnosis Date  . Paroxysmal atrial fibrillation     CHADS2 (4). Coumadin Rx  . Hypertension   . Stroke     dysarthria, resolved 2009  . Renal insufficiency     chronic  . Dyslipidemia   . Peripheral neuropathy   . Diabetes mellitus   . Amputation, traumatic, toes     two toes right foot  . Cataract 2011  . Ejection fraction     EF 60-65% 09/2007, echo  . Warfarin anticoagulation     Atrial  fib  . Vertigo     Vertigo with a fall 03/2012   Family History  Problem Relation Age of Onset  . Coronary artery disease      FAMILY H/O   Past Surgical History  Procedure Laterality Date  . Appendectomy    . Cholecystectomy    . Toe amputation      RIGHT    Short Social History:  History  Substance Use Topics  . Smoking status: Never Smoker   . Smokeless tobacco: Never Used  . Alcohol Use: No    No Known Allergies  Current Outpatient Prescriptions  Medication Sig Dispense Refill  . ALPRAZolam (XANAX) 0.25 MG tablet Take 0.25 mg by mouth at bedtime as needed for anxiety.    Marland Kitchen. amitriptyline (ELAVIL) 50 MG tablet Take 50 mg by mouth at bedtime.    . calcium-vitamin D (OSCAL WITH D) 500-200 MG-UNIT per tablet Take 1 tablet by mouth 2 (two) times daily.    . cephALEXin (KEFLEX) 500 MG capsule Take 500 mg by mouth 2 (two) times daily.    . cloNIDine (CATAPRES) 0.1 MG tablet Take 0.1 mg by mouth 2 (two) times daily.    . furosemide (LASIX) 40 MG tablet Take 20 mg by mouth. 50mg  daily    . hydrALAZINE (APRESOLINE) 50 MG tablet Take 50 mg by mouth 3 (  three) times daily.    . insulin lispro (HUMALOG) 100 UNIT/ML injection Inject into the skin. Sliding scale 4 x day    . ipratropium-albuterol (DUONEB) 0.5-2.5 (3) MG/3ML SOLN Take 3 mLs by nebulization every 6 (six) hours as needed.    . iron polysaccharides (NIFEREX) 150 MG capsule Take 150 mg by mouth daily.    . isosorbide mononitrate (IMDUR) 60 MG 24 hr tablet Take 60 mg by mouth 2 (two) times daily.    . metoprolol succinate (TOPROL-XL) 50 MG 24 hr tablet Take 50 mg by mouth every morning. Take with or immediately following a meal.    . Multiple Vitamin (MULTIVITAMIN) tablet Take 1 tablet by mouth daily.    . vitamin B-12 (CYANOCOBALAMIN) 500 MCG tablet Take 500 mcg by mouth daily.    . vitamin C (ASCORBIC ACID) 500 MG tablet Take 500 mg by mouth daily.    Marland Kitchen. warfarin (COUMADIN) 3 MG tablet Take 3 mg by mouth daily.    . Zinc 50  MG TABS Take 1 tablet by mouth daily.     No current facility-administered medications for this visit.    Review of Systems  Constitutional: Negative for chills and fever.  Eyes: Negative for loss of vision.  Respiratory: Negative for cough and wheezing.  Cardiovascular: Positive for claudication, dyspnea with exertion, irregular heartbeat, leg swelling and orthopnea. Negative for chest pain, chest tightness and palpitations.  GI: Negative for blood in stool and vomiting.  GU: Negative for dysuria and hematuria.  Musculoskeletal: Positive for joint pain. Negative for leg pain and myalgias.  Skin: Negative for rash and wound.  Neurological: Negative for dizziness and speech difficulty.  Hematologic: Negative for bruises/bleeds easily. Psychiatric: Negative for depressed mood.        Objective:  Objective  Filed Vitals:   01/22/14 1322  BP: 195/86  Pulse: 92  Resp: 18  Height: 5\' 6"  (1.676 m)  Weight: 172 lb (78.019 kg)   Body mass index is 27.77 kg/(m^2).  Physical Exam  Constitutional: She is oriented to person, place, and time. She appears well-developed and well-nourished.  HENT:  Head: Normocephalic and atraumatic.  Neck: Neck supple. No JVD present. No thyromegaly present.  Cardiovascular: Normal rate, regular rhythm and normal heart sounds.  Exam reveals no friction rub.   No murmur heard. Pulses:      Femoral pulses are 2+ on the right side, and 2+ on the left side.      Popliteal pulses are 0 on the right side, and 0 on the left side.       Dorsalis pedis pulses are 0 on the right side, and 0 on the left side.       Posterior tibial pulses are 0 on the right side, and 0 on the left side.  I do not detect carotid bruits  Pulmonary/Chest: Breath sounds normal. She has no wheezes. She has no rales.  Abdominal: Soft. Bowel sounds are normal. There is no tenderness.  I do not palpate an aneurysm.  Musculoskeletal: Normal range of motion. She exhibits no edema.    Lymphadenopathy:    She has no cervical adenopathy.  Neurological: She is alert and oriented to person, place, and time. She has normal strength. No sensory deficit.  Skin: No lesion and no rash noted.  She has an extensive wound on the medial aspect of her left foot which measures 7 cm x 4 cm. Using a Q-tip there appears to be some exposed bone. There is  a fair amount of nonviable tissue overlying the wound. There is poor granulation tissue.  Psychiatric: She has a normal mood and affect.    Data: I reviewed an arterial Doppler study that was dated 11/26/2013. This noted that her arteries were not compressible. She had monophasic blunted waveforms in bilateral posterior tibial and dorsalis pedis arteries.  I have reviewed an x-ray of her left foot dated 11/25/2013. This noted some air within the soft tissues concerning for infection. There was no clear evidence of osteomyelitis.   ARTERIAL DUPLEX LEFT LOWER EXTREMITY: I have independently interpreted the duplex scan that was done today of the left lower extremity. There appears to be diffuse infrainguinal arterial occlusive disease. There is an area of focal increased velocities in the mid superficial femoral artery. There is also diffuse tibial artery occlusive disease.      Assessment/Plan:     DIABETIC FOOT INFECTION WITH INFRAINGUINAL ARTERIAL OCCLUSIVE DISEASE: This patient has an extensive wound of the left foot with some exposed bone and poor granulation. Based on her exam she has evidence of infrainguinal arterial occlusive disease. This combined with her diabetes puts her at very high risk for limb loss. I think that without revascularization she would require an above-the-knee amputation. Her only chance for limb salvage would be revascularization. However, given her age and multiple medical comorbidities she would be at very high-risk for surgery. She has a history of congestive heart failure, COPD (and is now on oxygen), diabetes,  poorly controlled blood pressure, and a history of a previous stroke.   I have explained to the patient and her daughter that there is only a small chance that we will find disease amenable to angioplasty and that we are limited by having to use CO2. Have explained that the safest approach overall would be primary amputation electively. However they feel strongly about proceeding at least with a CO2 arteriogram to see if there are any options for limb salvage. For this reason we will stop her Coumadin today and this has been scheduled for 01/26/2014. If she has disease amenable to angioplasty we could potentially consider this. However I do not think she would be a candidate for a tibial bypass if that is her only option. If that were the case she would best be served with primary amputation.  I have reviewed with the patient the indications for arteriography. In addition, I have reviewed the potential complications of arteriography including but not limited to: Bleeding, arterial injury, arterial thrombosis, dye action, renal insufficiency, or other unpredictable medical problems. I have explained to the patient that if we find disease amenable to angioplasty we could potentially address this at the same time. I have discussed the potential complications of angioplasty and stenting, including but not limited to: Bleeding, arterial thrombosis, arterial injury, dissection, or the need for surgical intervention.       Chuck Hinthristopher S Linley Moxley MD [facility]

## 2014-01-27 ENCOUNTER — Encounter (HOSPITAL_COMMUNITY): Payer: Self-pay | Admitting: *Deleted

## 2014-01-27 ENCOUNTER — Inpatient Hospital Stay (HOSPITAL_COMMUNITY): Payer: Medicare Other

## 2014-01-27 DIAGNOSIS — I369 Nonrheumatic tricuspid valve disorder, unspecified: Secondary | ICD-10-CM

## 2014-01-27 DIAGNOSIS — I482 Chronic atrial fibrillation: Secondary | ICD-10-CM

## 2014-01-27 DIAGNOSIS — G9340 Encephalopathy, unspecified: Secondary | ICD-10-CM | POA: Diagnosis present

## 2014-01-27 DIAGNOSIS — E1151 Type 2 diabetes mellitus with diabetic peripheral angiopathy without gangrene: Secondary | ICD-10-CM | POA: Diagnosis present

## 2014-01-27 DIAGNOSIS — N183 Chronic kidney disease, stage 3 (moderate): Secondary | ICD-10-CM

## 2014-01-27 DIAGNOSIS — I70269 Atherosclerosis of native arteries of extremities with gangrene, unspecified extremity: Secondary | ICD-10-CM

## 2014-01-27 DIAGNOSIS — E1159 Type 2 diabetes mellitus with other circulatory complications: Secondary | ICD-10-CM

## 2014-01-27 DIAGNOSIS — D638 Anemia in other chronic diseases classified elsewhere: Secondary | ICD-10-CM

## 2014-01-27 DIAGNOSIS — Z7901 Long term (current) use of anticoagulants: Secondary | ICD-10-CM

## 2014-01-27 DIAGNOSIS — I739 Peripheral vascular disease, unspecified: Secondary | ICD-10-CM

## 2014-01-27 DIAGNOSIS — N184 Chronic kidney disease, stage 4 (severe): Secondary | ICD-10-CM

## 2014-01-27 DIAGNOSIS — I639 Cerebral infarction, unspecified: Secondary | ICD-10-CM

## 2014-01-27 DIAGNOSIS — I5032 Chronic diastolic (congestive) heart failure: Secondary | ICD-10-CM

## 2014-01-27 LAB — COMPREHENSIVE METABOLIC PANEL
ALBUMIN: 3 g/dL — AB (ref 3.5–5.2)
ALK PHOS: 70 U/L (ref 39–117)
ALT: 11 U/L (ref 0–35)
ANION GAP: 17 — AB (ref 5–15)
AST: 17 U/L (ref 0–37)
BILIRUBIN TOTAL: 0.2 mg/dL — AB (ref 0.3–1.2)
BUN: 49 mg/dL — ABNORMAL HIGH (ref 6–23)
CHLORIDE: 101 meq/L (ref 96–112)
CO2: 28 mEq/L (ref 19–32)
Calcium: 8.8 mg/dL (ref 8.4–10.5)
Creatinine, Ser: 2.29 mg/dL — ABNORMAL HIGH (ref 0.50–1.10)
GFR calc Af Amer: 23 mL/min — ABNORMAL LOW (ref >90)
GFR calc non Af Amer: 19 mL/min — ABNORMAL LOW (ref >90)
Glucose, Bld: 173 mg/dL — ABNORMAL HIGH (ref 70–99)
POTASSIUM: 3.9 meq/L (ref 3.7–5.3)
Sodium: 146 mEq/L (ref 137–147)
Total Protein: 6.7 g/dL (ref 6.0–8.3)

## 2014-01-27 LAB — BLOOD GAS, ARTERIAL
Acid-Base Excess: 5.5 mmol/L — ABNORMAL HIGH (ref 0.0–2.0)
Acid-Base Excess: 5 mmol/L — ABNORMAL HIGH (ref 0.0–2.0)
Bicarbonate: 32.1 meq/L — ABNORMAL HIGH (ref 20.0–24.0)
Bicarbonate: 30.6 meq/L — ABNORMAL HIGH (ref 20.0–24.0)
Drawn by: 313941
Drawn by: 39899
O2 Content: 2 L/min
O2 Content: 2 L/min
O2 Saturation: 93.7 %
O2 Saturation: 89.3 %
Patient temperature: 98.6
Patient temperature: 98.6
TCO2: 34.3 mmol/L (ref 0–100)
TCO2: 32.5 mmol/L (ref 0–100)
pCO2 arterial: 72.6 mmHg (ref 35.0–45.0)
pCO2 arterial: 60.5 mmHg (ref 35.0–45.0)
pH, Arterial: 7.268 — ABNORMAL LOW (ref 7.350–7.450)
pH, Arterial: 7.325 — ABNORMAL LOW (ref 7.350–7.450)
pO2, Arterial: 68.8 mmHg — ABNORMAL LOW (ref 80.0–100.0)
pO2, Arterial: 57.4 mmHg — ABNORMAL LOW (ref 80.0–100.0)

## 2014-01-27 LAB — GLUCOSE, CAPILLARY
GLUCOSE-CAPILLARY: 141 mg/dL — AB (ref 70–99)
Glucose-Capillary: 159 mg/dL — ABNORMAL HIGH (ref 70–99)
Glucose-Capillary: 170 mg/dL — ABNORMAL HIGH (ref 70–99)
Glucose-Capillary: 174 mg/dL — ABNORMAL HIGH (ref 70–99)
Glucose-Capillary: 43 mg/dL — CL (ref 70–99)

## 2014-01-27 LAB — CBC
HCT: 27.8 % — ABNORMAL LOW (ref 36.0–46.0)
HEMATOCRIT: 28.4 % — AB (ref 36.0–46.0)
Hemoglobin: 8.6 g/dL — ABNORMAL LOW (ref 12.0–15.0)
Hemoglobin: 8.7 g/dL — ABNORMAL LOW (ref 12.0–15.0)
MCH: 28.8 pg (ref 26.0–34.0)
MCH: 28.8 pg (ref 26.0–34.0)
MCHC: 30.9 g/dL (ref 30.0–36.0)
MCHC: 30.6 g/dL (ref 30.0–36.0)
MCV: 93 fL (ref 78.0–100.0)
MCV: 94 fL (ref 78.0–100.0)
PLATELETS: 168 10*3/uL (ref 150–400)
Platelets: 170 10*3/uL (ref 150–400)
RBC: 2.99 MIL/uL — ABNORMAL LOW (ref 3.87–5.11)
RBC: 3.02 MIL/uL — AB (ref 3.87–5.11)
RDW: 16.9 % — ABNORMAL HIGH (ref 11.5–15.5)
RDW: 17 % — AB (ref 11.5–15.5)
WBC: 4.9 10*3/uL (ref 4.0–10.5)
WBC: 4.6 10*3/uL (ref 4.0–10.5)

## 2014-01-27 LAB — PROTIME-INR
INR: 1.34 (ref 0.00–1.49)
Prothrombin Time: 16.7 seconds — ABNORMAL HIGH (ref 11.6–15.2)

## 2014-01-27 LAB — HEPARIN LEVEL (UNFRACTIONATED)
HEPARIN UNFRACTIONATED: 0.27 [IU]/mL — AB (ref 0.30–0.70)
Heparin Unfractionated: 0.19 [IU]/mL — ABNORMAL LOW (ref 0.30–0.70)

## 2014-01-27 MED ORDER — CETYLPYRIDINIUM CHLORIDE 0.05 % MT LIQD
7.0000 mL | Freq: Two times a day (BID) | OROMUCOSAL | Status: DC
  Administered 2014-01-27 – 2014-01-30 (×7): 7 mL via OROMUCOSAL

## 2014-01-27 MED ORDER — FUROSEMIDE 10 MG/ML IJ SOLN
40.0000 mg | Freq: Two times a day (BID) | INTRAMUSCULAR | Status: DC
  Administered 2014-01-27 – 2014-01-28 (×4): 40 mg via INTRAVENOUS
  Filled 2014-01-27 (×7): qty 4

## 2014-01-27 MED ORDER — FAMOTIDINE 20 MG PO TABS
20.0000 mg | ORAL_TABLET | Freq: Every day | ORAL | Status: DC
  Administered 2014-01-27 – 2014-01-30 (×4): 20 mg via ORAL
  Filled 2014-01-27 (×4): qty 1

## 2014-01-27 MED ORDER — DEXTROSE 50 % IV SOLN
25.0000 g | Freq: Once | INTRAVENOUS | Status: AC
  Administered 2014-01-27: 25 g via INTRAVENOUS

## 2014-01-27 MED ORDER — WARFARIN SODIUM 6 MG PO TABS
6.0000 mg | ORAL_TABLET | Freq: Once | ORAL | Status: AC
  Administered 2014-01-27: 6 mg via ORAL
  Filled 2014-01-27: qty 1

## 2014-01-27 MED ORDER — IPRATROPIUM-ALBUTEROL 0.5-2.5 (3) MG/3ML IN SOLN: 3.0000 mL | RESPIRATORY_TRACT | Status: DC | PRN

## 2014-01-27 MED ORDER — DEXTROSE 50 % IV SOLN
INTRAVENOUS | Status: AC
  Administered 2014-01-27: 25 g via INTRAVENOUS
  Filled 2014-01-27: qty 50

## 2014-01-27 MED ORDER — HYDRALAZINE HCL 50 MG PO TABS: 50.0000 mg | ORAL_TABLET | Freq: Three times a day (TID) | ORAL | Status: DC

## 2014-01-27 MED ORDER — ACETAMINOPHEN 325 MG PO TABS: 650.0000 mg | ORAL_TABLET | Freq: Four times a day (QID) | ORAL | Status: DC | PRN

## 2014-01-27 MED ORDER — INSULIN GLARGINE 100 UNIT/ML ~~LOC~~ SOLN
10.0000 [IU] | Freq: Every day | SUBCUTANEOUS | Status: DC
  Administered 2014-01-27: 10 [IU] via SUBCUTANEOUS
  Filled 2014-01-27 (×2): qty 0.1

## 2014-01-27 NOTE — Consult Note (Signed)
[facility] Medical Consultation  [name] ZOX:[id number] DOB: 15-Dec-1934 DOA: 01/26/2014 PCP: Estanislado Pandy, MD   Requesting physician: Dr. Edilia Bo Date of consultation: 11.24.2015 Reason for consultation: AMS  Impression/Recommendations Acute encephalopathy: - Multifactorial probably due to use of Elavil in the setting of acute renal failure. She has Ativan when necessary at night. - The patient relates she got something overnight.  - I would avoid Ativan in the setting of a new acute renal failure. - She is nonfocal on physical exam able to carry on a conversation without any difficulties. She is eating her breakfast and talking to her family normally. The family does not notice any difference in her mentation. I don't think there is any to rule out CVA she is nonfocal and physical exam. - Her family relates that she is hard to arouse at times at home. - I would recommend repeating an ABG in about 1 or 2 hours. - Her chest x-ray shows improved pulmonary edema with bilateral effusions.  Acute on chronic diastolic CHF (congestive heart failure): - Her estimated dry weight seems to be below 79 kg grams. - Cardiology on board continue IV Lasix further management per cardiology. - She is on Coreg and Lasix.  Acute on chronic renal failure: - In the setting of acute decompensated heart failure most likely due to heart failure. Further management per cardiology.  Chronic atrial fibrillation/Warfarin anticoagulation: - Continue heparin and Coumadin per cardiology.  Diabetes mellitus with peripheral vascular disease: - Her blood glucose is under great control with her sliding scale insulin. - We'll resume her home dose Lantus.  Atherosclerosis of native arteries of the extremities with gangrene - Further management per vascular surgery.   I will sign off.    Chief Complaint: AMS  HPI:  79 year old with past medical history of chronic diastolic heart failure with  anemia of 55%, past medical history of chronic atrial fibrillation on warfarin, diabetes with diabetic foot ulcer that being followed as an outpatient, history of CVA in 2009 and hypertension that was admitted on the day prior to consult by vascular surgery for a abdominal aortogram as she was being laid flat on the table she started complaining of shortness of breath and cardiology was consulted. She was admitted to the hospital and started diuresis, she got  Elavil overnight in the setting of new acute renal failure (due to acute decompensated heart failure), her creatinine continues to improve with diuresis. An ABG was done that shows a PCO2 of 72 and she was hard to arouse in the morning. When I went into the room the patient is sitting up in bed talking to her family and having breakfast. She is able to carry a conversation she doesn't appear to be short of breath.  Review of Systems:  Constitutional:  No weight loss, night sweats, Fevers, chills, fatigue.  HEENT:  No headaches, Difficulty swallowing,Tooth/dental problems,Sore throat,  No sneezing, itching, ear ache, nasal congestion, post nasal drip,  Cardio-vascular:  No chest pain, Orthopnea, PND, swelling in lower extremities, anasarca, dizziness, palpitations  GI:  No heartburn, indigestion, abdominal pain, nausea, vomiting, diarrhea, change in bowel habits, loss of appetite  Resp:  No shortness of breath with exertion or at rest. No excess mucus, no productive cough, No non-productive cough, No coughing up of blood.No change in color of mucus.No wheezing.No chest wall deformity  Skin:  no rash or lesions.  GU:  no dysuria, change in color of urine, no urgency or frequency. No flank pain.  Musculoskeletal:  No joint pain or swelling. No decreased range of motion. No back pain.  Psych:  No change in mood or affect. No depression or anxiety. No memory loss.   Past Medical History  Diagnosis Date  . Paroxysmal atrial fibrillation      CHADS2 (4). Coumadin Rx  . Hypertension   . Stroke     dysarthria, resolved 2009  . Renal insufficiency     chronic  . Dyslipidemia   . Peripheral neuropathy   . Diabetes mellitus   . Amputation, traumatic, toes     two toes right foot  . Cataract 2011  . Ejection fraction     EF 60-65% 09/2007, echo  . Warfarin anticoagulation     Atrial fib  . Vertigo     Vertigo with a fall 03/2012   Past Surgical History  Procedure Laterality Date  . Appendectomy    . Cholecystectomy    . Toe amputation      RIGHT   Social History:  reports that she has never smoked. She has never used smokeless tobacco. She reports that she does not drink alcohol or use illicit drugs.  Allergies  Allergen Reactions  . Ativan [Lorazepam] Other (See Comments)    Unknown  . Morphine And Related Other (See Comments)    MAR   Family History  Problem Relation Age of Onset  . Coronary artery disease      FAMILY H/O    Prior to Admission medications   Medication Sig Start Date End Date Taking? Authorizing Provider  acetaminophen (TYLENOL) 325 MG tablet Take 650 mg by mouth every 6 (six) hours as needed for mild pain.   Yes Historical Provider, MD  ALPRAZolam (XANAX) 0.25 MG tablet Take 0.25 mg by mouth 3 (three) times daily as needed for anxiety.    Yes Historical Provider, MD  amitriptyline (ELAVIL) 50 MG tablet Take 50 mg by mouth at bedtime.   Yes Historical Provider, MD  Cholecalciferol 2000 UNITS TABS Take 2,000 Units by mouth daily.   Yes Historical Provider, MD  collagenase (SANTYL) ointment Apply 1 application topically daily. Apply to left lower extremity   Yes Historical Provider, MD  darbepoetin (ARANESP) 60 MCG/0.3ML SOLN injection Inject 60 mcg into the skin every 7 (seven) days.   Yes Historical Provider, MD  famotidine (PEPCID) 20 MG tablet Take 20 mg by mouth 2 (two) times daily.   Yes Historical Provider, MD  furosemide (LASIX) 40 MG tablet Take 40 mg by mouth 2 (two) times daily.    Yes  Historical Provider, MD  hydrALAZINE (APRESOLINE) 50 MG tablet Take 50 mg by mouth 3 (three) times daily.   Yes Historical Provider, MD  HYDROcodone-acetaminophen (NORCO/VICODIN) 5-325 MG per tablet Take 1 tablet by mouth every 8 (eight) hours as needed (for pain).   Yes Historical Provider, MD  hydrocortisone (ANUSOL-HC) 2.5 % rectal cream Place 1 application rectally every 6 (six) hours as needed for hemorrhoids.   Yes Historical Provider, MD  insulin aspart (NOVOLOG) 100 UNIT/ML injection Inject 2-15 Units into the skin 4 (four) times daily -  before meals and at bedtime. Per SSI   Yes Historical Provider, MD  insulin glargine (LANTUS) 100 UNIT/ML injection Inject 15 Units into the skin daily.   Yes Historical Provider, MD  ipratropium-albuterol (DUONEB) 0.5-2.5 (3) MG/3ML SOLN Take 3 mLs by nebulization every 6 (six) hours.    Yes Historical Provider, MD  ipratropium-albuterol (DUONEB) 0.5-2.5 (3) MG/3ML SOLN Take 3 mLs by  nebulization every 4 (four) hours as needed (for shortness of breathe, wheezing).   Yes Historical Provider, MD  iron polysaccharides (NIFEREX) 150 MG capsule Take 150 mg by mouth 2 (two) times daily with a meal.    Yes Historical Provider, MD  isosorbide dinitrate (ISORDIL) 40 MG tablet Take 40 mg by mouth 3 (three) times daily.   Yes Historical Provider, MD  labetalol (NORMODYNE) 300 MG tablet Take 300 mg by mouth 2 (two) times daily.   Yes Historical Provider, MD  ondansetron (ZOFRAN) 4 MG/2ML SOLN injection Inject 4 mg into the vein every 6 (six) hours as needed for nausea or vomiting.   Yes Historical Provider, MD  vitamin B-12 (CYANOCOBALAMIN) 500 MCG tablet Take 500 mcg by mouth daily.   Yes Historical Provider, MD  vitamin C (ASCORBIC ACID) 500 MG tablet Take 500 mg by mouth daily.   Yes Historical Provider, MD  warfarin (COUMADIN) 4 MG tablet Take 4 mg by mouth daily.   Yes Historical Provider, MD  zinc sulfate 220 MG capsule Take 220 mg by mouth daily.   Yes Historical  Provider, MD  furosemide (LASIX) 40 MG tablet Take 1 tablet (40 mg total) by mouth daily. 01/26/14   Chuck Hinthristopher S Dickson, MD   Physical Exam: Blood pressure 167/56, pulse 82, temperature 98 F (36.7 C), temperature source Oral, resp. rate 18, height 5\' 6"  (1.676 m), weight 81.647 kg (180 lb), SpO2 93 %. Filed Vitals:   01/27/14 0500  BP: 167/56  Pulse: 82  Temp:   Resp: 18     General:  Awake alert 3 in no acute distress  Eyes: Anicteric extraocular movements are intact  ENT: Moist mucous membranes  Neck: Positive JVD  Cardiovascular: Irregular rate and rhythm  Respiratory: Good air movement with crackles at her bases bilaterally.  Abdomen: Positive bowel sounds nontender nondistended and soft  Skin: 3+ edema she has a gangrenous left foot.  Musculoskeletal: Intact  Psychiatric: Appropriate and able to carry a conversation hard of hearing.  Neurologic: She is awake alert and oriented 3 coherent and fluent language hard of hearing 3-12 are grossly intact sensation is intact throughout muscle strength is followed by the local extremity deep tendon reflexes 2+ bilaterally.  Labs on Admission:  Basic Metabolic Panel:  Recent Labs Lab 01/26/14 0752 01/27/14 0358  NA 143 146  K 3.9 3.9  CL 102 101  CO2  --  28  GLUCOSE 125* 173*  BUN 46* 49*  CREATININE 2.60* 2.29*  CALCIUM  --  8.8   Liver Function Tests:  Recent Labs Lab 01/27/14 0358  AST 17  ALT 11  ALKPHOS 70  BILITOT 0.2*  PROT 6.7  ALBUMIN 3.0*   No results for input(s): LIPASE, AMYLASE in the last 168 hours. No results for input(s): AMMONIA in the last 168 hours. CBC:  Recent Labs Lab 01/26/14 0752 01/26/14 2302 01/27/14 0358  WBC  --  4.9 4.6  HGB 9.5* 8.6* 8.7*  HCT 28.0* 27.8* 28.4*  MCV  --  93.0 94.0  PLT  --  168 170   Cardiac Enzymes: No results for input(s): CKTOTAL, CKMB, CKMBINDEX, TROPONINI in the last 168 hours. BNP: Invalid input(s): POCBNP CBG:  Recent Labs Lab  01/26/14 1310 01/26/14 1705 01/26/14 2221 01/27/14 0637  GLUCAP 145* 144* 186* 159*    Radiological Exams on Admission: Dg Chest Port 1 View  01/27/2014   CLINICAL DATA:  Shortness of breath.  EXAM: PORTABLE CHEST - 1 VIEW  COMPARISON:  01/14/2014  FINDINGS: Mild cardiomegaly. Bilateral airspace opacities likely reflects mild edema. There are bilateral pleural effusions, small on the left and moderate on the right. The right effusion is increased since prior study. Probable bibasilar atelectasis.  IMPRESSION: Mild CHF. Small left effusion and enlarging moderate right effusion with bibasilar atelectasis.   Electronically Signed   By: Charlett NoseKevin  Dover M.D.   On: 01/27/2014 10:15    EKG: Independently reviewed. None  Time spent: 80 minutes  Marinda ElkFELIZ ORTIZ, Rosella Crandell [facility] Pager [phone]  If 7PM-7AM, please contact night-coverage [email or web address] Password [email or web address] 01/27/2014, 11:01 AM

## 2014-01-27 NOTE — Clinical Documentation Improvement (Signed)
Pt with gangrene, PVD, and ischemia please clarify if these conditions can be (linked to/ or due) to the DM and document in pn or d/c summary   Possible Clinical Conditions?   Manifestations:  _______DM retinopathy  _______DM PVD _______DM neuropathy   _______DM nephropathy  Associated conditions: _______DM cellulitis _______DM gangrene _______DM osteomyelitis  _______Other Condition _______Cannot Clinically determine   Supporting Information: Risk Factors: DFU,  Signs & Symptoms: Atherosclerosis of native arteries of the extremities with gangrene.Marland Kitchen.Marland Kitchen.Marland Kitchen.At this point, it seems that &amputation may be necessary per cn 01/26/14  concern for ischemia she was referred for vascular consultation per HP  with the diagnosis of pvd with left foot ulcer per 01/26/14  She will likely need AKA in the future, however, her medical issues take precedence at this time. Per pn 01/27/14  Diagnostics: Treatment: insulin aspart (novoLOG) injection 0-15 Units  collagenase (SANTYL) ointment 1 application  Thank You, Enis SlipperLolita J Zephaniah Enyeart ,RN Clinical Documentation Specialist:  [phone]  [facility]- Health Information Management

## 2014-01-27 NOTE — Progress Notes (Signed)
ANTICOAGULATION CONSULT NOTE - Follow Up Consult  Pharmacy Consult for heparin and warfarin Indication: atrial fibrillation  Allergies  Allergen Reactions  . Ativan [Lorazepam] Other (See Comments)    Unknown  . Morphine And Related Other (See Comments)    MAR    Patient Measurements: Height: 5\' 6"  (167.6 cm) Weight: 180 lb (81.647 kg) (bed scale) IBW/kg (Calculated) : 59.3 Heparin Dosing Weight: 75 kg  Vital Signs: BP: 167/56 mmHg (11/24 0500) Pulse Rate: 82 (11/24 0500)  Labs:  Recent Labs  01/26/14 0742  01/26/14 0752 01/26/14 2302 01/27/14 0358 01/27/14 0900  HGB  --   < > 9.5* 8.6* 8.7*  --   HCT  --   --  28.0* 27.8* 28.4*  --   PLT  --   --   --  168 170  --   LABPROT 17.4*  --   --   --  16.7*  --   INR 1.41  --   --   --  1.34  --   HEPARINUNFRC  --   --   --  0.20*  --  0.27*  CREATININE  --   --  2.60*  --  2.29*  --   < > = values in this interval not displayed.  Estimated Creatinine Clearance: 22.2 mL/min (by C-G formula based on Cr of 2.29).   Medications:  Infusions:  . heparin 1,250 Units/hr (01/27/14 0121)    Assessment: 79 y/o female who presented for CO2 angiogram on 11/23 but this was cancelled due to inability to lay flat. She has a nonhealing diabetic foot infection with arterial occlusive disease for which limb salvage is being attempted. She also takes chronic warfarin for Afib. INR is SUBtherapeutic at 1.34. Heparin level is just below goal at 0.27 on 1250 units/hr. No bleeding noted, H/H are low, platelets are normal.  PTA: 4 mg daily   Goal of Therapy:  Heparin level 0.3-0.7 units/ml INR 2-2.5 per MD request Monitor platelets by anticoagulation protocol: Yes   Plan:  - Increase heparin drip to 1350 units/hr - 8 hr heparin level - Warfarin 6 mg PO tonight - Heparin level, INR and CBC daily - Monitor for s/sx of bleeding  [facility] Hospital[facility]ennifer [place], MorningsidePharm.D., BCPS Clinical Pharmacist Pager: [phone] 01/27/2014 10:38 AM

## 2014-01-27 NOTE — Progress Notes (Signed)
  Echocardiogram 2D Echocardiogram has been performed.  Marisa Hufstetler 01/27/2014, 12:00 PM

## 2014-01-27 NOTE — Progress Notes (Signed)
Paged by nursing staff for tremors. Mental status improved compare to when I saw her this morning. She was able to perform basic conversation, however unclear baseline mental status. IM consulted this morning for AMS, however felt it was likely medication induced. Her AMS likely not cardiac in origin. No focal neurological deficit on exam. Will continue to monitor, if has deterioration of mental status, we will need to reconsult IM.  Ramond DialSigned, Kaidyn Hernandes PA Pager: [phone]

## 2014-01-27 NOTE — Progress Notes (Signed)
ANTICOAGULATION CONSULT NOTE - Follow Up Consult  Pharmacy Consult for heparin  Indication: atrial fibrillation  Allergies  Allergen Reactions  . Ativan [Lorazepam] Other (See Comments)    Unknown  . Morphine And Related Other (See Comments)    MAR    Patient Measurements: Height: 5\' 6"  (167.6 cm) Weight: 179 lb 7.3 oz (81.4 kg) IBW/kg (Calculated) : 59.3 Heparin Dosing Weight:   Vital Signs: Temp: 98 F (36.7 C) (11/23 1353) Temp Source: Oral (11/23 1353) BP: 158/53 mmHg (11/23 2221) Pulse Rate: 82 (11/23 2221)  Labs:  Recent Labs  01/26/14 0742 01/26/14 0752 01/26/14 2302  HGB  --  9.5* 8.6*  HCT  --  28.0* 27.8*  PLT  --   --  168  LABPROT 17.4*  --   --   INR 1.41  --   --   HEPARINUNFRC  --   --  0.20*  CREATININE  --  2.60*  --     Estimated Creatinine Clearance: 19.5 mL/min (by C-G formula based on Cr of 2.6).   Medications:  Heparin infusion at 1050 units/hr   Assessment: Heparin level below goal at 0.20 IU/ml. No bleeding noted. plt 168.  Goal of Therapy:  Heparin level 0.3-0.7 units/ml Monitor platelets by anticoagulation protocol: Yes   Plan:  Increase heparin to 1250/hr and recheck HL in 8 hours    [name], [name] 01/27/2014,1:04 AM

## 2014-01-27 NOTE — Progress Notes (Signed)
ANTICOAGULATION CONSULT NOTE - Follow Up Consult  Pharmacy Consult for heparin and warfarin Indication: atrial fibrillation  Allergies  Allergen Reactions  . Ativan [Lorazepam] Other (See Comments)    Unknown  . Morphine And Related Other (See Comments)    MAR    Patient Measurements: Height: 5\' 6"  (167.6 cm) Weight: 180 lb (81.647 kg) (bed scale) IBW/kg (Calculated) : 59.3 Heparin Dosing Weight: 75 kg  Vital Signs: Temp: 97.7 F (36.5 C) (11/24 1509) Temp Source: Axillary (11/24 1509) BP: 181/61 mmHg (11/24 1737) Pulse Rate: 83 (11/24 1509)  Labs:  Recent Labs  01/26/14 0742  01/26/14 0752 01/26/14 2302 01/27/14 0358 01/27/14 0900 01/27/14 1836  HGB  --   < > 9.5* 8.6* 8.7*  --   --   HCT  --   --  28.0* 27.8* 28.4*  --   --   PLT  --   --   --  168 170  --   --   LABPROT 17.4*  --   --   --  16.7*  --   --   INR 1.41  --   --   --  1.34  --   --   HEPARINUNFRC  --   --   --  0.20*  --  0.27* 0.19*  CREATININE  --   --  2.60*  --  2.29*  --   --   < > = values in this interval not displayed.  Estimated Creatinine Clearance: 22.2 mL/min (by C-G formula based on Cr of 2.29).   Medications:  Infusions:  . heparin 1,350 Units/hr (01/27/14 1100)    Assessment: 79 y/o female who presented for CO2 angiogram on 11/23 but this was cancelled due to inability to lay flat.  She is being diuresed with IV furosemide. She has a nonhealing diabetic foot infection with arterial occlusive disease for which limb salvage is being attempted. She also takes  Warfarin PTA for Afib. INR is SUBtherapeutic at 1.34. Heparin  level is below goal at 0.19 on 1350 units/hr. No bleeding noted, H/H are low, platelets are normal.  PTA: 4 mg daily   Goal of Therapy:  Heparin level 0.3-0.7 units/ml INR 2-2.5 per MD request Monitor platelets by anticoagulation protocol: Yes   Plan:  - Increase heparin drip to 1500 units/hr - Heparin level, INR and CBC daily - Monitor for s/sx of  bleeding  Leota SauersLisa Nikolus Marczak Pharm.D. CPP, BCPS Clinical Pharmacist [phone] 01/27/2014 8:01 PM

## 2014-01-27 NOTE — Progress Notes (Signed)
MD Nahser contacted about Pt blood gas results. MD Nahser says he will contact primary [facility] RN updated Consulting civil engineerCharge RN and will continue to try to get in contact with Primary MD as well. Pt currently in no distress. RN will continue to monitor.

## 2014-01-27 NOTE — Progress Notes (Addendum)
VASCULAR SURGERY ASSESSMENT & PLAN:  * This patient was admitted yesterday after her CO2 arteriogram was canceled because of her significant shortness of breath. She was seen in consultation by Dr. Myrtis SerKatz. He felt that she should be admitted because of her congestive heart failure and shortness of breath, and asked us to admit the patient.   * CARDIAC: Dr. Myrtis SerKatz felt that her primary issue was acute on chronic diastolic congestive heart failure. He felt that she should be admitted for diuresis and careful management of her fluids given her renal insufficiency. The patient was seen on rounds today by Dr. Elease HashimotoNahser. He noted that her Echo in September showed normal LV systolic function. Diastolic dysfunction seemed to be worse however. A repeat echo was ordered. She was continued on IV Lasix. Chest x-ray today showed mild congestive heart failure. There was a small left effusion and a moderate right effusion. Cardiology has agreed to manage her congestive heart failure. However, they recommended internal medicine consult given her multiple other medical conditions including ( anemia, chronic kidney disease, history of previous stroke, and diabetes).  * HISTORY OF ATRIAL FIBRILLATION: The patient had been on Coumadin for atrial fibrillation and this has been held for the procedure. Pharmacy is now managing her heparin and Coumadin.  * DIABETES: The patient was seen in consultation by internal medicine and Dr. David StallFeliz Ortiz felt that her blood sugar was well controlled and has signed off.  * CHRONIC KIDNEY DISEASE: Her creatinine on admission was 2.6. This is actually improved today at 2.29. She is at her baseline. The patient was seen in consultation by internal medicine and Dr. David StallFeliz Ortiz felt that her chronic kidney disease was stable and has signed off.  * H/O CVA:  Her mental status is at its baseline.   * ANEMIA: her chronic anemia is stable.  *  VASCULAR SURGERY ISSUES: I had seen the patient in  consultation on 01/22/2014 with extensive wound of her left foot. She has peripheral vascular disease related to her diabetes. Given her debilitated state I did not think she was a candidate for revascularization and felt that the safest approach would be a primary above-the-knee amputation if the wound progressed. However, the family felt strongly about at least proceeding with a CO2 arteriogram to see if there were any endovascular options to assist with wound healing. I have discussed the situation with the family again given her issues with heart failure and her procedure having to be cancel. At this point, I think that the daughter understands that the safest approach is primary above-the-knee amputation if the wound progresses. Currently there is no signs of sepsis and she simply has the indolent wound on her left foot.   * DISPOSITION:  I have discussed the situation with the patient. Currently she does not wish to proceed with a left above-the-knee amputation. For this reason, from my standpoint, she can continue with her Coumadin. Once her congestive heart failure is stable she can be transferred back to the skilled nursing facility and [place] foot wound. If it progresses she could be admitted for primary left above-the-knee amputation. If over the next few days she decides that she is agreeable to proceed with a left above-the-knee amputation, we would need to hold her Coumadin and I could potentially proceed early next week.  SUBJECTIVE: The patient is easily arousable. Her mental status is at its baseline. She has no specific complaints.  PHYSICAL  EXAMCeasar Mons: Filed Vitals:   01/26/14 1353 01/26/14 2221 01/27/14 0149 01/27/14 0500  BP: 169/58 158/53 155/55 167/56  Pulse: 86 82 72 82  Temp: 98 F (36.7 C)     TempSrc: Oral     Resp: 18 18 18 18   Height: 5\' 6"  (1.676 m)     Weight: 179 lb 7.3 oz (81.4 kg)   180 lb (81.647 kg)  SpO2: 95% 93% 100% 93%    I changed her dressing on her left foot. She has an open wound where the first and second toes were amputated. There is some fibrous tissue overlying the wound  and some mild cellulitis. There is no drainage. LUNGS: She has decreased breath sounds at both bases. NEURO: There is no focal weakness.  LABS: Lab Results  Component Value Date   WBC 4.6 01/27/2014   HGB 8.7* 01/27/2014   HCT 28.4* 01/27/2014   MCV 94.0 01/27/2014   PLT 170 01/27/2014   Lab Results  Component Value Date   CREATININE 2.29* 01/27/2014   Lab Results  Component Value Date   INR 1.34 01/27/2014   CBG (last 3)   Recent Labs  01/26/14 2221 01/27/14 0637 01/27/14 1208  GLUCAP 186* 159* 170*    Principal Problem:   Acute encephalopathy Active Problems:   Chronic atrial fibrillation   Stroke   Warfarin anticoagulation   CKD (chronic kidney disease) stage 3, GFR 30-59 ml/min   Atherosclerosis of native arteries of the extremities with gangrene   Acute on chronic diastolic CHF (congestive heart failure)   Acute on chronic renal failure   Anemia   Diabetes mellitus with peripheral artery disease   Cari CarawayChris Madisun Hargrove Beeper: [phone] 01/27/2014

## 2014-01-27 NOTE — Progress Notes (Signed)
Spoke with [facility] and they are to see pt.  [name], [name] 01/27/2014 10:36 AM

## 2014-01-27 NOTE — Progress Notes (Signed)
Patient alert and oriented x2 (disoriented to time and situation) throughout shift.  Patient unable to tolerate  SCDs due to leg wounds and confusion.  Patient woke up numerous times during shift stating, "I need to go make coffee, get out of my way".  Patient able to be calmed with assistance from RN and NT.  IV wrapped with gauze.  [facility] and [facility].  Will continue to monitor.

## 2014-01-27 NOTE — Progress Notes (Signed)
Arteriogram cancelled yesterday due to acute CHF and medical issues.  Cardiology to take over care of pt and medical management as foot issues will be managed outpatient.    She will likely need AKA in the future, however, her medical issues take precedence at this time.   [name] 01/27/2014 8:07 AM

## 2014-01-27 NOTE — Progress Notes (Signed)
Patient Name: [name] Date of Encounter: 01/27/2014  Primary Cardiologist Elmore GuiseKatz, [place] Office   Active Problems:   Chronic atrial fibrillation   Stroke   Diabetes mellitus   Warfarin anticoagulation   CKD (chronic kidney disease) stage 3, GFR 30-59 ml/min   Atherosclerosis of native arteries of the extremities with gangrene   Acute on chronic diastolic CHF (congestive heart failure)   Acute on chronic renal failure   Anemia    SUBJECTIVE  79 yo female with chronic a-fib, stroke, DM, CKD and chronic diastolic HF who presented for LE arteriography however was unable to lay flat and also noted to have significant renal insufficiency. Cardiology consulted for acute on chronic diastolic HF.   Did not wake up for me this morning. However per nurse, patient was awake for her earlier this morning. Upon entering the room, pt was sleeping on the side of the bed with her feet dangling on the side and her back elevated at 60degree angle. Responded to pain, however did not respond questions. Per nurse, hard of hearing, family requested not to give pt xanax which have known to cause AMS in her. Unclear if any sedation given yesterday  CURRENT MEDS . amitriptyline  50 mg Oral QHS  . antiseptic oral rinse  7 mL Mouth Rinse BID  . carvedilol  6.25 mg Oral BID WC  . cholecalciferol  2,000 Units Oral Daily  . collagenase  1 application Topical Daily  . vitamin B-12  500 mcg Oral Daily  . furosemide  40 mg Oral Daily  . hydrALAZINE  50 mg Oral TID  . insulin aspart  0-15 Units Subcutaneous TID WC  . iron polysaccharides  150 mg Oral BID  . isosorbide dinitrate  40 mg Oral TID  . pantoprazole  40 mg Oral Daily  . sodium chloride  3 mL Intravenous Q12H  . vitamin C  500 mg Oral Daily  . Warfarin - Pharmacist Dosing Inpatient   Does not apply q1800  . zinc sulfate  220 mg Oral Daily    OBJECTIVE  Filed Vitals:   01/26/14 1353 01/26/14 2221 01/27/14 0149 01/27/14 0500  BP: 169/58  158/53 155/55 167/56  Pulse: 86 82 72 82  Temp: 98 F (36.7 C)     TempSrc: Oral     Resp: 18 18 18 18   Height: 5\' 6"  (1.676 m)     Weight: 179 lb 7.3 oz (81.4 kg)   180 lb (81.647 kg)  SpO2: 95% 93% 100% 93%    Intake/Output Summary (Last 24 hours) at 01/27/14 0829 Last data filed at 01/27/14 0700  Gross per 24 hour  Intake 1014.93 ml  Output   1350 ml  Net -335.07 ml   Filed Weights   01/26/14 0723 01/26/14 1353 01/27/14 0500  Weight: 168 lb (76.204 kg) 179 lb 7.3 oz (81.4 kg) 180 lb (81.647 kg)    PHYSICAL EXAM  General: Did not respond to question, does respond to pain Neuro: Moves all extremities spontaneously. HEENT:  Normal  Neck: Supple without bruits. Markedly elevated JVD up to base of ear Lungs:  Resp regular and unlabored, markedly decreased breath sound in bilateral bases Heart: RRR no s3, s4, or murmurs. Abdomen: Soft, non-tender, non-distended, BS + x 4.  Extremities: No clubbing, cyanosis. DP/PT/Radials 2+ and equal bilaterally. 3+ edema in bilateral LE, L foot wrapped in dressing  Accessory Clinical Findings  CBC  Recent Labs  01/26/14 2302 01/27/14 0358  WBC 4.9 4.6  HGB 8.6* 8.7*  HCT 27.8* 28.4*  MCV 93.0 94.0  PLT 168 170   Basic Metabolic Panel  Recent Labs  01/26/14 0752 01/27/14 0358  NA 143 146  K 3.9 3.9  CL 102 101  CO2  --  28  GLUCOSE 125* 173*  BUN 46* 49*  CREATININE 2.60* 2.29*  CALCIUM  --  8.8   Liver Function Tests  Recent Labs  01/27/14 0358  AST 17  ALT 11  ALKPHOS 70  BILITOT 0.2*  PROT 6.7  ALBUMIN 3.0*    TELE NSR with HR 70-80s    ECG  Pending EKG   Radiology/Studies  No results found.  ASSESSMENT AND PLAN  1. Acute on chronic diastolic HF  - received 1 dose of IV lasix last night, transitioned to PO lasix 40mg  daily today.  - will increase to 40mg  PO BID which is patient's home dose once HF symptom resolve, right now patient has elevated JVD to the base of her earlobe, markedly  decreased breath sound near bilateral base, will obtain portable CXR and change PO lasix to 40mg  BID IV lasix.   - continue heparin and coumadin. Home labetalol changed to coreg. Will hold off increase coreg for now given significant HF symptom, however hypertensive in 150-170s, will need to increase coreg once HF symptom under control  - per Dr. Myrtis Ser, had recent echo at [facility] showed EF 50-55%, consider obtain Echocardiogram  2. Acute on chronic renal insufficiency  - improving  3. DM  4. Stroke  5. PVD: per vascular surgery, followup as outpatient, likely need AKA in the future.   6. Anemia: continue to monitor  7. HTN: uncontrolled.   8. AMS: did not wake up for me this morning. However per nurse, has really bad hearing, and responded for her this morning. I have asked patient nurse to monitor her mental status again.   Ramond Dial PA-C Pager: [phone]   Attending Note:   The patient was seen and examined.  Agree with assessment and plan as noted above.  Changes made to the above note as needed.  This is an elderly female with hx of chronic Afib, stroke, PVD, diastolic CHF, acute on chronic renal failure.  She was brought to the hospital yesterday for a vascular procedure.  She was aggitated and was not able to lie flat.  She was found to have significant JVD and leg edema.  1. Altered mental status:    She has an altered mental status this am by my exam.   She has had a stroke previously.   Was not able to communicate or cooperate with instructions or coaching.    Discussed with the nurse who said this was apparently typical for her   She prefers to lie on her right side.  Attempting to get out of bed constantly.   I attempted to call Peter Garter (her contact listed in the chart) to see if this altered mental status is typical for her.   If these neuro changes are new, she will need a head CT.    2. Acute on chronic diastolic CHF The echo from Sept. Shows normal  LV systolic function.  She has diastolic dysfunction which seems to possibly worse.  Complicating this issue is her  renal failure - I do not know her baseline renal function.  We have ordered lasix IV since the PO does not seem to be working adequately. I have ordered a repeat echo Will get a  CXR.  3. CKD -     4. Anemia:    5. Essential Hypertension:  Poorly controlled at this point .  Started IV lasix today. On Coreg 6.25 BID,  Hydralazine 50 TID,  Isordil 40 TID,    In the note from [facility] today, there was discussion about transferng her to the cardiology service.  She would be better served on the Medicine service.  Will ask [facility] to contact [facility] .  We will continue to see for her CHF  Alvia GrovePhilip J. Nahser, Jr., MD, [facility] 01/27/2014, 9:26 AM [address],  [address] Office [phone]- [phone] Pager [phone]- [phone]

## 2014-01-27 NOTE — Progress Notes (Addendum)
Hypoglycemic Event  CBG: 43   Treatment: 50% dextrose 25grams  Symptoms: Nervous/irritable  Follow-up CBG: Time:2200 CBG Result: 141   Possible Reasons for Event: Inadequate meal intake  Comments/MD notified: on call for [facility]cardiology     Dory PeruSayles, Davida Falconi M  Remember to initiate Hypoglycemia Order Set & complete

## 2014-01-27 NOTE — Plan of Care (Signed)
Problem: Phase I Progression Outcomes Goal: Dyspnea controlled at rest (HF) Outcome: Completed/Met Date Met:  01/27/14 Goal: Pain controlled with appropriate interventions Outcome: Completed/Met Date Met:  01/27/14 Goal: Voiding-avoid urinary catheter unless indicated Outcome: Not Progressing Patient has chronic indwelling catheter.

## 2014-01-27 NOTE — Progress Notes (Signed)
Blood gas ordered by Physician. Respiratory came and reported to RN that Pt is a hard stick and would try to re-stick later.

## 2014-01-28 ENCOUNTER — Inpatient Hospital Stay (HOSPITAL_COMMUNITY): Payer: Medicare Other

## 2014-01-28 DIAGNOSIS — G9340 Encephalopathy, unspecified: Secondary | ICD-10-CM

## 2014-01-28 DIAGNOSIS — I5042 Chronic combined systolic (congestive) and diastolic (congestive) heart failure: Secondary | ICD-10-CM

## 2014-01-28 LAB — GLUCOSE, CAPILLARY
GLUCOSE-CAPILLARY: 173 mg/dL — AB (ref 70–99)
Glucose-Capillary: 129 mg/dL — ABNORMAL HIGH (ref 70–99)
Glucose-Capillary: 137 mg/dL — ABNORMAL HIGH (ref 70–99)
Glucose-Capillary: 168 mg/dL — ABNORMAL HIGH (ref 70–99)

## 2014-01-28 LAB — CBC
HEMATOCRIT: 28.8 % — AB (ref 36.0–46.0)
Hemoglobin: 8.7 g/dL — ABNORMAL LOW (ref 12.0–15.0)
MCH: 28.2 pg (ref 26.0–34.0)
MCHC: 30.2 g/dL (ref 30.0–36.0)
MCV: 93.5 fL (ref 78.0–100.0)
PLATELETS: 178 10*3/uL (ref 150–400)
RBC: 3.08 MIL/uL — ABNORMAL LOW (ref 3.87–5.11)
RDW: 16.8 % — ABNORMAL HIGH (ref 11.5–15.5)
WBC: 9.7 10*3/uL (ref 4.0–10.5)

## 2014-01-28 LAB — BLOOD GAS, ARTERIAL
ACID-BASE EXCESS: 5.3 mmol/L — AB (ref 0.0–2.0)
Bicarbonate: 30.8 mEq/L — ABNORMAL HIGH (ref 20.0–24.0)
Drawn by: 281201
O2 Content: 3 L/min
O2 Saturation: 88.7 %
PATIENT TEMPERATURE: 98.6
PCO2 ART: 59.5 mmHg — AB (ref 35.0–45.0)
TCO2: 32.6 mmol/L (ref 0–100)
pH, Arterial: 7.334 — ABNORMAL LOW (ref 7.350–7.450)
pO2, Arterial: 57.1 mmHg — ABNORMAL LOW (ref 80.0–100.0)

## 2014-01-28 LAB — BASIC METABOLIC PANEL
Anion gap: 12 (ref 5–15)
BUN: 50 mg/dL — ABNORMAL HIGH (ref 6–23)
CALCIUM: 9.1 mg/dL (ref 8.4–10.5)
CO2: 32 mEq/L (ref 19–32)
CREATININE: 2.19 mg/dL — AB (ref 0.50–1.10)
Chloride: 99 mEq/L (ref 96–112)
GFR calc non Af Amer: 21 mL/min — ABNORMAL LOW (ref >90)
GFR, EST AFRICAN AMERICAN: 24 mL/min — AB (ref >90)
Glucose, Bld: 120 mg/dL — ABNORMAL HIGH (ref 70–99)
Potassium: 4.3 mEq/L (ref 3.7–5.3)
Sodium: 143 mEq/L (ref 137–147)

## 2014-01-28 LAB — HEPARIN LEVEL (UNFRACTIONATED)
HEPARIN UNFRACTIONATED: 0.48 [IU]/mL (ref 0.30–0.70)
HEPARIN UNFRACTIONATED: 0.62 [IU]/mL (ref 0.30–0.70)

## 2014-01-28 LAB — PROTIME-INR
INR: 1.82 — ABNORMAL HIGH (ref 0.00–1.49)
Prothrombin Time: 21.2 seconds — ABNORMAL HIGH (ref 11.6–15.2)

## 2014-01-28 MED ORDER — WARFARIN SODIUM 4 MG PO TABS
4.0000 mg | ORAL_TABLET | Freq: Once | ORAL | Status: AC
  Administered 2014-01-28: 4 mg via ORAL
  Filled 2014-01-28: qty 1

## 2014-01-28 NOTE — Progress Notes (Signed)
ANTICOAGULATION CONSULT NOTE - Follow Up Consult  Pharmacy Consult for Heparin  Indication: atrial fibrillation  Allergies  Allergen Reactions  . Ativan [Lorazepam] Other (See Comments)    Unknown  . Morphine And Related Other (See Comments)    MAR    Patient Measurements: Height: 5\' 6"  (167.6 cm) Weight: 180 lb (81.647 kg) (bed scale) IBW/kg (Calculated) : 59.3  Vital Signs: Temp: 97.4 F (36.3 C) (11/25 0130) Temp Source: Oral (11/25 0130) BP: 150/56 mmHg (11/25 0130) Pulse Rate: 82 (11/25 0130)  Labs:  Recent Labs  01/26/14 0742  01/26/14 0752  01/26/14 2302 01/27/14 0358 01/27/14 0900 01/27/14 1836 01/28/14 0437  HGB  --   < > 9.5*  --  8.6* 8.7*  --   --  8.7*  HCT  --   < > 28.0*  --  27.8* 28.4*  --   --  28.8*  PLT  --   --   --   --  168 170  --   --  178  LABPROT 17.4*  --   --   --   --  16.7*  --   --  21.2*  INR 1.41  --   --   --   --  1.34  --   --  1.82*  HEPARINUNFRC  --   --   --   < > 0.20*  --  0.27* 0.19* 0.48  CREATININE  --   --  2.60*  --   --  2.29*  --   --   --   < > = values in this interval not displayed.  Estimated Creatinine Clearance: 22.2 mL/min (by C-G formula based on Cr of 2.29).   Assessment: Therapeutic heparin level x 1 after rate increase. Hgb low/stable. Other labs as above.   Goal of Therapy:  Heparin level 0.3-0.7 units/ml Monitor platelets by anticoagulation protocol: Yes   Plan:  -Continue heparin at 1500 units/hr -1300 HL -Daily CBC/HL -Monitor for bleeding  Abran DukeLedford, Jenella Craigie 01/28/2014,6:04 AM

## 2014-01-28 NOTE — Progress Notes (Signed)
CSW order received [facility]  [facility]. RNCM - Sidney AceJulie Amerson is aware of above and will follow. CSW will sign off but available if CSW needs arise. Thanks! Lorri Frederickonna T. Jaci LazierCrowder, KentuckyLCSW  [phone]

## 2014-01-28 NOTE — Progress Notes (Addendum)
ANTICOAGULATION CONSULT NOTE - Follow Up Consult  Pharmacy Consult for Heparin and Warfarin Indication: atrial fibrillation  Allergies  Allergen Reactions  . Ativan [Lorazepam] Other (See Comments)    Unknown  . Morphine And Related Other (See Comments)    MAR    Patient Measurements: Height: 5\' 6"  (167.6 cm) Weight: 180 lb (81.647 kg) (bedscale) IBW/kg (Calculated) : 59.3 Heparin Dosing Weight: 76 kg  Vital Signs: Temp: 98.4 F (36.9 C) (11/25 0638) Temp Source: Oral (11/25 81190638) BP: 160/98 mmHg (11/25 1100) Pulse Rate: 98 (11/25 1100)  Labs:  Recent Labs  01/26/14 0742  01/26/14 0752  01/26/14 2302 01/27/14 0358 01/27/14 0900 01/27/14 1836 01/28/14 0437 01/28/14 0857  HGB  --   < > 9.5*  --  8.6* 8.7*  --   --  8.7*  --   HCT  --   < > 28.0*  --  27.8* 28.4*  --   --  28.8*  --   PLT  --   --   --   --  168 170  --   --  178  --   LABPROT 17.4*  --   --   --   --  16.7*  --   --  21.2*  --   INR 1.41  --   --   --   --  1.34  --   --  1.82*  --   HEPARINUNFRC  --   --   --   < > 0.20*  --  0.27* 0.19* 0.48  --   CREATININE  --   --  2.60*  --   --  2.29*  --   --   --  2.19*  < > = values in this interval not displayed.  Estimated Creatinine Clearance: 23.2 mL/min (by C-G formula based on Cr of 2.19).   Medications:  Infusions:  . heparin 1,500 Units/hr (01/27/14 2016)    Assessment: 79 year old female who presented for CO2 angiogram on 11/23 but this was cancelled due to inability to lay flat. She is being diuresed with IV furosemide. She has a nonhealing diabetic foot infection with arterial occlusive disease for which limb salvage is being attempted. She also takes Warfarin PTA for Afib. INR is SUBtherapeutic at 1.82, but with a good increase from yesterday. Heparin level this morning was therapeutic.  Her confirmatory heparin level has been delayed due to CT scan for AMS.  No bleeding noted, H/H are low, platelets are normal.  Goal of Therapy:   Heparin level 0.3-0.7 units/ml  INR 2-2.5 per MD request Monitor platelets by anticoagulation protocol: Yes   Plan:  Warfarin 4mg  today Continue Heparin at 1500 units/hr Check heparin level when patient is back from CT scan Follow-up results of CT scan (r/o CVA) Daily PT/INR, heparin level, CBC  Estella HuskMichelle Turner, Pharm.D., BCPS, AAHIVP Clinical Pharmacist Phone: [phone] or [phone] 01/28/2014, 2:25 PM  Addum:  Heparin level therapeutic 0.62 units/ml.  Cont same.  Talbert CageLora Bunnie Lederman, PharmD

## 2014-01-28 NOTE — Progress Notes (Signed)
Results for [name], [name] (MRN [id number]) as of 01/28/2014 10:44  Ref. Range 01/27/2014 21:34 01/27/2014 21:57 01/28/2014 04:37 01/28/2014 06:41 01/28/2014 08:57  Glucose-Capillary Latest Range: 70-99 mg/dL 43 (LL) 829141 (H)  562129 (H)   CBG last night 43 mg/dl.  Patient not eating well.  Recommend decreasing Novolog correction scale to SENSITIVE TID & HS if CBGs continue to be less than 100 mg/dl and until eating better.  Remains on Lantus 10 units daily. Will continue to follow while in hospital. Smith MinceKendra Maleena Eddleman RN BSN CDE

## 2014-01-28 NOTE — Progress Notes (Addendum)
  Progress Note    01/28/2014 8:01 AM Hospital Day 2  Subjective:  States she is breathing better  Afebrile 150's-170's systolic HR 70's-80's 96% 3LO2NC  Filed Vitals:   01/28/14 0638  BP: 170/58  Pulse: 87  Temp: 98.4 F (36.9 C)  Resp: 18    Physical Exam: Lungs:  Decreased breath sounds at bases right > left, otherwise, clear Cardiac:  regular  CBC    Component Value Date/Time   WBC 9.7 01/28/2014 0437   RBC 3.08* 01/28/2014 0437   HGB 8.7* 01/28/2014 0437   HCT 28.8* 01/28/2014 0437   PLT 178 01/28/2014 0437   MCV 93.5 01/28/2014 0437   MCH 28.2 01/28/2014 0437   MCHC 30.2 01/28/2014 0437   RDW 16.8* 01/28/2014 0437    BMET    Component Value Date/Time   NA 146 01/27/2014 0358   K 3.9 01/27/2014 0358   CL 101 01/27/2014 0358   CO2 28 01/27/2014 0358   GLUCOSE 173* 01/27/2014 0358   BUN 49* 01/27/2014 0358   CREATININE 2.29* 01/27/2014 0358   CALCIUM 8.8 01/27/2014 0358   GFRNONAA 19* 01/27/2014 0358   GFRAA 23* 01/27/2014 0358    INR    Component Value Date/Time   INR 1.82* 01/28/2014 0437   INR 2.3 11/21/2013 1314   INR 3.0 05/10/2010 1414     Intake/Output Summary (Last 24 hours) at 01/28/14 0801 Last data filed at 01/28/14 [id number]  Gross per 24 hour  Intake  344.5 ml  Output   1000 ml  Net -655.5 ml     Assessment/Plan:  79 y.o. female is  Admitted for acute CHF after cancellation of her arteriogram Hospital Day 2  -pt awake and alert this am -states her breathing is better and she feels better -management of CHF per cardiology -heparin/coumadin per pharmacy -pt does not want amputation at this time.  [place] at [place]. -she may return to SNF when ok with cardiology   Doreatha MassedSamantha Rhyne, PA-C [facility] [phone] 01/28/2014 8:01 AM  I have examined the patient, reviewed and agree with above. Discussed with patient and daughter-in-law present. No active vascular issues.  Being worked up for altered mental status. Appreciate Dr. Robb Matarrtiz willingness to take on medical service. We'll transfer to hospitalist service  Sharilyn Geisinger, MD 01/28/2014 3:48 PM

## 2014-01-28 NOTE — Progress Notes (Addendum)
[facility] PROGRESS NOTE Filed Weights   01/26/14 1353 01/27/14 0500 01/28/14 [id number]  Weight: 81.4 kg (179 lb 7.3 oz) 81.647 kg (180 lb) 81.647 kg (180 lb)        Intake/Output Summary (Last 24 hours) at 01/28/14 1237 Last data filed at 01/28/14 0831  Gross per 24 hour  Intake  404.5 ml  Output   1000 ml  Net -595.5 ml     Assessment/Plan: Acute encephalopathy: - Re-consulted by cardiology. For medical manegement. [facility] - Most likely medication induce she did get ativan for the procedure. - Will avoid narcotics and BZD's. - Can use haldol if needed. Pt cont to improve with avoiding sedatives. - She is more awake today. She might have underlying dementia although the family denies it. She is definitively having delirium as she mentation fluctuates through out the day.  Use Haldol. - Abg repeated and cont to improve. - Consult PT, will go back to SNF.  Acute on chronic diastolic CHF (congestive heart failure) - Per cardiology with moderate UOP still with JVD and lower ext edema.  Acute on CKD (chronic kidney disease) stage 3, GFR 30-59 ml/min/Cardiorenal syndrome: - Improving with diuresis. - Further management per cardiology.  Diabetes mellitus with peripheral artery disease - Good control. Pt cont to eat. Mild episode of hypoglycemia. - Hold Lantus, cont SSI. - As her cr cont to improve. She will require more insulin.  Chronic atrial fibrillation/Warfarin anticoagulation: - Continue heparin and Coumadin per cardiology.  Atherosclerosis of native arteries of the extremities with gangrene - Further management per vascular surgery. - wound care consult.  Code Status: full Family Communication: daughter in law  Disposition Plan: inpatient   Procedures: ECHO 11.25.2015: estimated ejection fraction was in the range of 45% to 50%. There is akinesis of the mid-apicalanteroseptal myocardium. Features are consistent with  apseudonormal left ventricular filling pattern, with concomitant abnormal relaxation and increased filling pressure (grade 2 diastolic dysfunction).  Antibiotics:  None  HPI/Subjective: No complains, a little more groggy today.  Objective: Filed Vitals:   01/27/14 2022 01/28/14 0130 01/28/14 0638 01/28/14 1100  BP: 139/60 150/56 170/58 160/98  Pulse: 75 82 87 98  Temp: 98.2 F (36.8 C) 97.4 F (36.3 C) 98.4 F (36.9 C)   TempSrc: Oral Oral Oral   Resp: 20 20 18 16   Height:      Weight:   81.647 kg (180 lb)   SpO2: 97% 94% 96% 99%     Exam:  General: Alert, awake, oriented x3, in no acute distress.  HEENT: No bruits, no goiter. +JVD Heart: Regular rate and rhythm. Lungs: Good air movement, clear Abdomen: Soft, nontender, nondistended, positive bowel sounds.  Neuro: Grossly intact, nonfocal.   Data Reviewed: Basic Metabolic Panel:  Recent Labs Lab 01/26/14 0752 01/27/14 0358 01/28/14 0857  NA 143 146 143  K 3.9 3.9 4.3  CL 102 101 99  CO2  --  28 32  GLUCOSE 125* 173* 120*  BUN 46* 49* 50*  CREATININE 2.60* 2.29* 2.19*  CALCIUM  --  8.8 9.1   Liver Function Tests:  Recent Labs Lab 01/27/14 0358  AST 17  ALT 11  ALKPHOS 70  BILITOT 0.2*  PROT 6.7  ALBUMIN 3.0*   No results for input(s): LIPASE, AMYLASE in the last 168 hours. No results for input(s): AMMONIA in the last 168 hours. CBC:  Recent Labs Lab 01/26/14 0752 01/26/14 2302 01/27/14 0358 01/28/14 0437  WBC  --  4.9 4.6 9.7  HGB 9.5* 8.6* 8.7* 8.7*  HCT 28.0* 27.8* 28.4* 28.8*  MCV  --  93.0 94.0 93.5  PLT  --  168 170 178   Cardiac Enzymes: No results for input(s): CKTOTAL, CKMB, CKMBINDEX, TROPONINI in the last 168 hours. BNP (last 3 results) No results for input(s): PROBNP in the last 8760 hours. CBG:  Recent Labs Lab 01/27/14 1631 01/27/14 2134 01/27/14 2157 01/28/14 0641 01/28/14 1115  GLUCAP 174* 43* 141* 129* 137*    No results found for this or any previous  visit (from the past 240 hour(s)).   Studies: Dg Chest Port 1 View  01/27/2014   CLINICAL DATA:  Shortness of breath.  EXAM: PORTABLE CHEST - 1 VIEW  COMPARISON:  01/14/2014  FINDINGS: Mild cardiomegaly. Bilateral airspace opacities likely reflects mild edema. There are bilateral pleural effusions, small on the left and moderate on the right. The right effusion is increased since prior study. Probable bibasilar atelectasis.  IMPRESSION: Mild CHF. Small left effusion and enlarging moderate right effusion with bibasilar atelectasis.   Electronically Signed   By: Charlett NoseKevin  Dover M.D.   On: 01/27/2014 10:15    Scheduled Meds: . amitriptyline  50 mg Oral QHS  . antiseptic oral rinse  7 mL Mouth Rinse BID  . carvedilol  6.25 mg Oral BID WC  . cholecalciferol  2,000 Units Oral Daily  . collagenase  1 application Topical Daily  . vitamin B-12  500 mcg Oral Daily  . famotidine  20 mg Oral Daily  . furosemide  40 mg Intravenous BID  . hydrALAZINE  50 mg Oral TID  . insulin aspart  0-15 Units Subcutaneous TID WC  . insulin glargine  10 Units Subcutaneous Daily  . iron polysaccharides  150 mg Oral BID  . isosorbide dinitrate  40 mg Oral TID  . pantoprazole  40 mg Oral Daily  . sodium chloride  3 mL Intravenous Q12H  . vitamin C  500 mg Oral Daily  . Warfarin - Pharmacist Dosing Inpatient   Does not apply q1800  . zinc sulfate  220 mg Oral Daily   Continuous Infusions: . heparin 1,500 Units/hr (01/27/14 2016)     [name], [name]  [facility] Pager [phone] If 8PM-8AM, please contact night-coverage at [email or web address], password [email or web address] 01/28/2014, 12:37 PM  LOS: 2 days

## 2014-01-28 NOTE — Clinical Documentation Improvement (Signed)
Pt with COPD and now on oxygen per H/P.  Please clarify if COPD in setting of abnormal ABGs (see below) can be further specified as one of the diagnoses listed below and document in pn or d/c summary.   Possible Clinical Conditions?  Acute Respiratory Failure Acute on Chronic Respiratory Failure Chronic Respiratory Failure Respiratory Acidosis Other Condition Cannot Clinically Determine   Supporting Information: Risk Factors: Signs & Symptoms: COPD (and is now on oxygen) per H/P Her ABG shows CO2 retention - daughter in Law says that she has COPD per PN 01/28/14  Diagnostics: Component      pH, Arterial pCO2 arterial pO2, Arterial  Latest Ref Rng      7.350 - 7.450 35.0 - 45.0 mmHg 80.0 - 100.0 mmHg  01/27/2014     9:55 AM 7.268 (L) 72.6 (HH) 68.8 (L)   Component      Bicarbonate Acid-Base Excess  Latest Ref Rng      20.0 - 24.0 mEq/L 0.0 - 2.0 mmol/L  01/27/2014     9:55 AM 32.1 (H) 5.5 (H)   Component      pH, Arterial pCO2 arterial pO2, Arterial  Latest Ref Rng      7.350 - 7.450 35.0 - 45.0 mmHg 80.0 - 100.0 mmHg  01/27/2014     4:45 PM 7.325 (L) 60.5 (HH) 57.4 (L)   Component      Bicarbonate Acid-Base Excess  Latest Ref Rng      20.0 - 24.0 mEq/L 0.0 - 2.0 mmol/L  01/27/2014     4:45 PM 30.6 (H) 5.0 (H)   Component      pH, Arterial pCO2 arterial pO2, Arterial  Latest Ref Rng      7.350 - 7.450 35.0 - 45.0 mmHg 80.0 - 100.0 mmHg  01/28/2014     11:50 AM 7.334 (L) 59.5 (HH) 57.1 (L)   Component      Bicarbonate  Latest Ref Rng      20.0 - 24.0 mEq/L  01/28/2014     11:50 AM 30.8 (H)   Treatment: O2@4L /min per [place]  Thank You, Enis SlipperLolita J Keilen Kahl ,RN Clinical Documentation Specialist:  [phone]  [facility]- [facility] Information Management

## 2014-01-28 NOTE — Progress Notes (Signed)
Patient Name: [name] Date of Encounter: 01/28/2014     Principal Problem:   Acute encephalopathy Active Problems:   Chronic atrial fibrillation   Stroke   Warfarin anticoagulation   CKD (chronic kidney disease) stage 3, GFR 30-59 ml/min   Atherosclerosis of native arteries of the extremities with gangrene   Acute on chronic diastolic CHF (congestive heart failure)   Acute on chronic renal failure   Anemia   Diabetes mellitus with peripheral artery disease    SUBJECTIVE  79 yo female with chronic a-fib, stroke, DM, CKD and chronic diastolic HF who presented for LE arteriography however was unable to lay flat and also noted to have significant renal insufficiency. Cardiology consulted for acute on chronic diastolic HF.   Denies any CP, continue to have some SOB, however improved. More alert this morning, however still does not answer a lot of question and looks sleepy. Apparently more alert when vascular saw her.   CURRENT MEDS . amitriptyline  50 mg Oral QHS  . antiseptic oral rinse  7 mL Mouth Rinse BID  . carvedilol  6.25 mg Oral BID WC  . cholecalciferol  2,000 Units Oral Daily  . collagenase  1 application Topical Daily  . vitamin B-12  500 mcg Oral Daily  . famotidine  20 mg Oral Daily  . furosemide  40 mg Intravenous BID  . hydrALAZINE  50 mg Oral TID  . insulin aspart  0-15 Units Subcutaneous TID WC  . insulin glargine  10 Units Subcutaneous Daily  . iron polysaccharides  150 mg Oral BID  . isosorbide dinitrate  40 mg Oral TID  . pantoprazole  40 mg Oral Daily  . sodium chloride  3 mL Intravenous Q12H  . vitamin C  500 mg Oral Daily  . Warfarin - Pharmacist Dosing Inpatient   Does not apply q1800  . zinc sulfate  220 mg Oral Daily    OBJECTIVE  Filed Vitals:   01/27/14 1737 01/27/14 2022 01/28/14 0130 01/28/14 0638  BP: 181/61 139/60 150/56 170/58  Pulse:  75 82 87  Temp:  98.2 F (36.8 C) 97.4 F (36.3 C) 98.4 F (36.9 C)  TempSrc:  Oral  Oral Oral  Resp:  20 20 18   Height:      Weight:    180 lb (81.647 kg)  SpO2:  97% 94% 96%    Intake/Output Summary (Last 24 hours) at 01/28/14 0843 Last data filed at 01/28/14 0831  Gross per 24 hour  Intake  404.5 ml  Output   1000 ml  Net -595.5 ml   Filed Weights   01/26/14 1353 01/27/14 0500 01/28/14 0638  Weight: 179 lb 7.3 oz (81.4 kg) 180 lb (81.647 kg) 180 lb (81.647 kg)    PHYSICAL EXAM  General: sleepy, chronically ill . Neuro: very somulent, woke up only to vigorous tapping on shoulder Psych: lethargic  HEENT:  Normal  Neck: Supple without bruits or JVD. Lungs:  Resp regular and unlabored, markedly decreased breath sound bilaterally Heart: RRR no s3, s4, or murmurs. Abdomen: Soft, non-tender, non-distended, BS + x 4.  Extremities: No clubbing, cyanosis. 2+ pitting edema with mild erythema in bilateral LE She has decreased skin turger in arms.   Accessory Clinical Findings  CBC  Recent Labs  01/27/14 0358 01/28/14 0437  WBC 4.6 9.7  HGB 8.7* 8.7*  HCT 28.4* 28.8*  MCV 94.0 93.5  PLT 170 178   Basic Metabolic Panel  Recent Labs  01/26/14 0752  01/27/14 0358  NA 143 146  K 3.9 3.9  CL 102 101  CO2  --  28  GLUCOSE 125* 173*  BUN 46* 49*  CREATININE 2.60* 2.29*  CALCIUM  --  8.8   Liver Function Tests  Recent Labs  01/27/14 0358  AST 17  ALT 11  ALKPHOS 70  BILITOT 0.2*  PROT 6.7  ALBUMIN 3.0*    TELE NSR with HR 70-80s, no significant ventricular ectopy    ECG  No new EKG  Echocardiogram 01/27/2014  - Left ventricle: The cavity size was normal. There was moderate focal basal and mild concentric hypertrophy of the septum. Systolic function was mildly reduced. The estimated ejection fraction was in the range of 45% to 50%. There is akinesis of the mid-apicalanteroseptal myocardium. Features are consistent with a pseudonormal left ventricular filling pattern, with concomitant abnormal relaxation and increased  filling pressure (grade 2 diastolic dysfunction). - Mitral valve: Calcified annulus. There was mild regurgitation. - Left atrium: The atrium was mildly dilated. - Right ventricle: The cavity size was mildly dilated. Wall thickness was normal. - Right atrium: The atrium was mildly dilated. - Tricuspid valve: There was moderate regurgitation. - Pulmonary arteries: Systolic pressure was moderately to severely increased. PA peak pressure: 65 mm Hg (S). - Pericardium, extracardiac: A trivial pericardial effusion was identified posterior to the heart. There was a moderate-sized left pleural effusion.    Radiology/Studies  Dg Chest Port 1 View  01/27/2014   CLINICAL DATA:  Shortness of breath.  EXAM: PORTABLE CHEST - 1 VIEW  COMPARISON:  01/14/2014  FINDINGS: Mild cardiomegaly. Bilateral airspace opacities likely reflects mild edema. There are bilateral pleural effusions, small on the left and moderate on the right. The right effusion is increased since prior study. Probable bibasilar atelectasis.  IMPRESSION: Mild CHF. Small left effusion and enlarging moderate right effusion with bibasilar atelectasis.   Electronically Signed   By: Charlett NoseKevin  Dover M.D.   On: 01/27/2014 10:15    ASSESSMENT AND PLAN  1. Acute on chronic diastolic HF -her baseline LV function is mildly reduced.  She is on Lasix 40 BID with some diuresis.  She has persistent leg edema but her arms have reduced skin turgur c/w volume depletion  - per Dr. Myrtis SerKatz, [facility] 50-55%  - repeat echo 01/27/2014 EF 45-50%, mild LVH, akinesis of mid apical anteroseptal myocardium, grade 2 diastolic dysfunction, mild MR, moderate TR, PA peak pressure 65 mmHg, trivial pericardial effusion, moderate sized L pleural effusion  - mildly decreased EF on echo with wall motion abnormality, however her renal function, frailty, mental status and anemia make her a poor candidate for diagnostic  evaluation. Will discuss with MD.  2. Acute on chronic renal insufficiency - improving, recheck BMET today after diuresis    3. DM  - hypoglycemic episode last night due to inadequate meal intake  4. Stroke  5. PVD: per vascular surgery, followup as outpatient, likely need AKA in the future.   6. Anemia: continue to monitor  7. HTN:    8. AMS: IM consulted   9. Chronic a-fib  Hassan BucklerSigned, Meng, Hao PA-C Pager: [phone]    Attending Note:   The patient was seen and examined.  Agree with assessment and plan as noted above.  Changes made to the above note as needed.  I have edited the above note physical exam and A/p.   1. Altered mental status:  This is her main issue at this time.  She has not had any benzo or opiates to explain her neuro changes.  I talked today with RosaLee - Daughter in law - who stated that this is not typical of her.  She is typically active and enjoys doing puzzles.  Currently, she is barely arousable . I have asked Dr. Robb Matar to reassess her.  I think she may need a neuro eval or head CT.  She is at risk for CVA - she has afib and coumadin has been stopped for her vascular procedure.   I will go ahead and get a CT of the head without contrast.  2. Atrial fib - rate is ok for now  3. Chronic combined systolic and diastolic CHF:  I think she has only minimal volume overload at this time.  She has decreased skin turger in her arms.  Her BUM and creatinine are elevated.  Will continue current meds for now - her creatinine has improved slightly . Either way,  She is able to lie fairly flat with any extra dyspnea.  Her ABG shows CO2 retention - daughter in Conni Elliot says that she has COPD. Will repeat ABG today.   4. PVD:  Plans for revascularization  / amputation are currently on hold.    Vesta Mixer, Montez Hageman., MD, [facility] 01/28/2014, 10:38 AM [address],  [address] Office [phone] Pager [phone]

## 2014-01-28 NOTE — Plan of Care (Signed)
Problem: Phase I Progression Outcomes Goal: EF % per last Echo/documented,Core Reminder form on chart Outcome: Progressing Ef;45-50  Goal: Initial discharge plan identified Outcome: Progressing Goal: Voiding-avoid urinary catheter unless indicated Outcome: Not Applicable Date Met:  26/83/41 Goal: Hemodynamically stable Outcome: Progressing

## 2014-01-29 LAB — CBC
HCT: 26.7 % — ABNORMAL LOW (ref 36.0–46.0)
Hemoglobin: 8 g/dL — ABNORMAL LOW (ref 12.0–15.0)
MCH: 27.3 pg (ref 26.0–34.0)
MCHC: 30 g/dL (ref 30.0–36.0)
MCV: 91.1 fL (ref 78.0–100.0)
PLATELETS: 166 10*3/uL (ref 150–400)
RBC: 2.93 MIL/uL — ABNORMAL LOW (ref 3.87–5.11)
RDW: 16.9 % — ABNORMAL HIGH (ref 11.5–15.5)
WBC: 7.4 10*3/uL (ref 4.0–10.5)

## 2014-01-29 LAB — PROTIME-INR
INR: 3.19 — ABNORMAL HIGH (ref 0.00–1.49)
Prothrombin Time: 32.9 seconds — ABNORMAL HIGH (ref 11.6–15.2)

## 2014-01-29 LAB — GLUCOSE, CAPILLARY
GLUCOSE-CAPILLARY: 127 mg/dL — AB (ref 70–99)
GLUCOSE-CAPILLARY: 125 mg/dL — AB (ref 70–99)
GLUCOSE-CAPILLARY: 144 mg/dL — AB (ref 70–99)
Glucose-Capillary: 190 mg/dL — ABNORMAL HIGH (ref 70–99)

## 2014-01-29 LAB — HEPARIN LEVEL (UNFRACTIONATED): Heparin Unfractionated: 0.47 IU/mL (ref 0.30–0.70)

## 2014-01-29 MED ORDER — FUROSEMIDE 40 MG PO TABS
40.0000 mg | ORAL_TABLET | Freq: Every day | ORAL | Status: DC
  Administered 2014-01-29 – 2014-01-30 (×2): 40 mg via ORAL
  Filled 2014-01-29 (×2): qty 1

## 2014-01-29 MED ORDER — HALOPERIDOL LACTATE 5 MG/ML IJ SOLN: 1.0000 mg | Freq: Once | INTRAMUSCULAR | Status: AC | PRN

## 2014-01-29 NOTE — Consult Note (Addendum)
WOC wound consult note Reason for Consult: Left foot full thickness wound.  Patient seen and followed by [facility], [facility].  It is noted that patient and family have declined the amputation reccommended at this time and that conservative wound care will continue.  MD has ordered enzymatic debriding agent, I will provided Nursing orders for wound dressing change and provided measurements for the record. 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 WOC nursing team will not follow, but will remain available to this patient, the nursing and medical teams.  Please re-consult if needed. Thanks, Ladona MowLaurie Sukaina Toothaker, MSN, RN, GNP, [place]OC[place], CWON-AP [phone])

## 2014-01-29 NOTE — Progress Notes (Signed)
ANTICOAGULATION CONSULT NOTE - Follow Up Consult  Pharmacy Consult for Warfarin Indication: atrial fibrillation  Allergies  Allergen Reactions  . Ativan [Lorazepam] Other (See Comments)    Unknown  . Morphine And Related Other (See Comments)    MAR    Patient Measurements: Height: 5\' 6"  (167.6 cm) Weight: 178 lb 6.4 oz (80.922 kg) IBW/kg (Calculated) : 59.3 Heparin Dosing Weight: 76 kg  Vital Signs: Temp: 98.8 F (37.1 C) (11/26 0627) Temp Source: Oral (11/26 0627) BP: 144/57 mmHg (11/26 0627) Pulse Rate: 72 (11/26 0627)  Labs:  Recent Labs  01/26/14 0752  01/27/14 0358  01/28/14 0437 01/28/14 0857 01/28/14 1650 01/29/14 0402  HGB 9.5*  < > 8.7*  --  8.7*  --   --  8.0*  HCT 28.0*  < > 28.4*  --  28.8*  --   --  26.7*  PLT  --   < > 170  --  178  --   --  166  LABPROT  --   --  16.7*  --  21.2*  --   --  32.9*  INR  --   --  1.34  --  1.82*  --   --  3.19*  HEPARINUNFRC  --   < >  --   < > 0.48  --  0.62 0.47  CREATININE 2.60*  --  2.29*  --   --  2.19*  --   --   < > = values in this interval not displayed.  Estimated Creatinine Clearance: 23.1 mL/min (by C-G formula based on Cr of 2.19).   Assessment: 79 year old female who presented for CO2 angiogram on 11/23 but this was cancelled due to inability to lay flat. She is being diuresed with IV furosemide. She has a nonhealing diabetic foot infection with arterial occlusive disease for which limb salvage is being attempted. She also takeswarfarin PTA for Afib. INR is SUPRAtherapeutic at 3.19 >> 1.82 and has significantly rose over the last 2 days likely due to not eating very well (0-25% of meals). No bleeding noted, H/H are low, platelets are normal.  PTA: 4 mg daily  Goal of Therapy:  INR 2-2.5 per MD request Monitor platelets by anticoagulation protocol: Yes   Plan:  No warfarin today Discontinue heparin drip INR daily Monitor for s/sx of bleeding  [facility]le[facility] HospitalJennifer Twilight, BloomvillePharm.D., BCPS Clinical  Pharmacist Pager: [phone] 01/29/2014 7:37 AM

## 2014-01-29 NOTE — Progress Notes (Signed)
Patient ID: [name], female   DOB: 01/11/1935, 79 y.o.   MRN: [id number]    SUBJECTIVE:  The patient remains lethargic. She is not complaining of chest pain. CT of the head done yesterday showed no acute abnormality.   Filed Vitals:   01/28/14 1100 01/28/14 2218 01/28/14 2238 01/29/14 0627  BP: 160/98 119/46 180/73 144/57  Pulse: 98 88 90 72  Temp:  97.7 F (36.5 C)  98.8 F (37.1 C)  TempSrc:  Oral  Oral  Resp: 16 20  18   Height:      Weight:    178 lb 6.4 oz (80.922 kg)  SpO2: 99% 97% 98% 98%     Intake/Output Summary (Last 24 hours) at 01/29/14 0816 Last data filed at 01/29/14 0500  Gross per 24 hour  Intake  926.6 ml  Output    700 ml  Net  226.6 ml    LABS: Basic Metabolic Panel:  Recent Labs  40/98/1111/24/15 0358 01/28/14 0857  NA 146 143  K 3.9 4.3  CL 101 99  CO2 28 32  GLUCOSE 173* 120*  BUN 49* 50*  CREATININE 2.29* 2.19*  CALCIUM 8.8 9.1   Liver Function Tests:  Recent Labs  01/27/14 0358  AST 17  ALT 11  ALKPHOS 70  BILITOT 0.2*  PROT 6.7  ALBUMIN 3.0*   No results for input(s): LIPASE, AMYLASE in the last 72 hours. CBC:  Recent Labs  01/28/14 0437 01/29/14 0402  WBC 9.7 7.4  HGB 8.7* 8.0*  HCT 28.8* 26.7*  MCV 93.5 91.1  PLT 178 166   Cardiac Enzymes: No results for input(s): CKTOTAL, CKMB, CKMBINDEX, TROPONINI in the last 72 hours. BNP: Invalid input(s): POCBNP D-Dimer: No results for input(s): DDIMER in the last 72 hours. Hemoglobin A1C: No results for input(s): HGBA1C in the last 72 hours. Fasting Lipid Panel: No results for input(s): CHOL, HDL, LDLCALC, TRIG, CHOLHDL, LDLDIRECT in the last 72 hours. Thyroid Function Tests: No results for input(s): TSH, T4TOTAL, T3FREE, THYROIDAB in the last 72 hours.  Invalid input(s): FREET3  RADIOLOGY: Ct Head Wo Contrast  01/28/2014   CLINICAL DATA:  CVA  EXAM: CT HEAD WITHOUT CONTRAST  TECHNIQUE: Contiguous axial images were obtained from the base of the skull through the  vertex without intravenous contrast.  COMPARISON:  01/11/2014  FINDINGS: No skull fracture is noted. Paranasal sinuses and mastoid air cells are unremarkable. No intracranial hemorrhage, mass effect or midline shift. Stable mild cerebral atrophy. Stable periventricular chronic white matter disease. No acute cortical infarction. No mass lesion is noted on this unenhanced scan.  IMPRESSION: No acute intracranial abnormality. Mild cerebral atrophy. Stable periventricular chronic mild white matter disease.   Electronically Signed   By: Natasha MeadLiviu  Pop M.D.   On: 01/28/2014 14:57   Dg Chest Port 1 View  01/27/2014   CLINICAL DATA:  Shortness of breath.  EXAM: PORTABLE CHEST - 1 VIEW  COMPARISON:  01/14/2014  FINDINGS: Mild cardiomegaly. Bilateral airspace opacities likely reflects mild edema. There are bilateral pleural effusions, small on the left and moderate on the right. The right effusion is increased since prior study. Probable bibasilar atelectasis.  IMPRESSION: Mild CHF. Small left effusion and enlarging moderate right effusion with bibasilar atelectasis.   Electronically Signed   By: Charlett NoseKevin  Dover M.D.   On: 01/27/2014 10:15    PHYSICAL EXAM  patient is lethargic. Lung exam continues to reveal some fine crackles. Cardiac exam reveals S1 and S2. The rhythm is irregular. Abdomen  is soft. The left foot is wrapped. There is no marked edema on the right.   ASSESSMENT AND PLAN:    Acute encephalopathy     This is being managed by the internal medicine team.    Chronic atrial fibrillation     Atrial fib rate is controlled.    Warfarin anticoagulation     Coumadin is being managed by pharmacy.    CKD (chronic kidney disease) stage 3, GFR 30-59 ml/min     Renal function was slightly better yesterday. I do not see labs yet from today. I have ordered chemistries for tomorrow.    Atherosclerosis of native arteries of the extremities with gangrene     Patient is followed by the vascular surgery team. The  plan will be for return to skilled nursing facility.    Acute on chronic diastolic CHF (congestive heart failure)     The patient has diuresed some since admission. I decided to change her IV Lasix to oral Lasix today. Continue to assess her volume status daily. Her physical exam was difficult as she has some continued fine crackles. There has been some decrease in left ventricular function based on the current echo compared to her most recent echo. At this point no further invasive cardiac evaluation is planned.  Willa RoughJeffrey Shaley Leavens 01/29/2014 8:16 AM

## 2014-01-29 NOTE — Progress Notes (Addendum)
[facility] PROGRESS NOTE Filed Weights   01/27/14 0500 01/28/14 0638 01/29/14 0627  Weight: 81.647 kg (180 lb) 81.647 kg (180 lb) 80.922 kg (178 lb 6.4 oz)        Intake/Output Summary (Last 24 hours) at 01/29/14 1056 Last data filed at 01/29/14 0500  Gross per 24 hour  Intake  866.6 ml  Output    700 ml  Net  166.6 ml     Assessment/Plan: Acute encephalopathy: - Re-consulted by cardiology. For medical manegement. [facility] - Appears multifactorial but Most likely medication induce she did get ativan for the procedure, as well as possible acute delirium, and hypercarbia. - Will avoid narcotics and BZD's. - Can use haldol if needed. Pt cont to improve with avoiding sedatives. - She is more awake today. She might have underlying dementia although the family denies it. She is definitively having delirium as she mentation fluctuates through out the day.  Use Haldol. - Abg repeated and cont to improve. - Consult PT, will go back to SNF.    COPD -Patient does not have active wheezing, does not appear to be in acute exacerbation, hypercarbia most likely worsened secondary to medication. -Continue with nebulizer as needed  Acute on chronic diastolic CHF (congestive heart failure) - Per cardiology with moderate UOP still with JVD and lower ext edema, and evidence of filling overload on x-ray, diuresis as per cardiology, -800 mL since admission. -Continues to improve since admission, [facility] as per vascular surgery note .recheck BMP in a.m..  Acute on CKD (chronic kidney disease) stage 3, GFR 30-59 ml/min/Cardiorenal syndrome: - Improving with diuresis. - Further management per cardiology.  Diabetes mellitus with peripheral artery disease - Good control. Pt cont to eat. Mild episode of hypoglycemia. - Hold Lantus, cont SSI. - As her cr cont to improve. She will require more insulin.  Chronic  atrial fibrillation/Warfarin anticoagulation: - Continue  Coumadin per cardiology.  Atherosclerosis of native arteries of the extremities with gangrene - Further management per vascular surgery. - wound care consult. - Initial recommendation was for amputation, but family want to pursue CO2 angiogram first as per vascular surgery note, patient is being followed by vascular surgery.  Code Status: full Family Communication: daughter in law  Disposition Plan: inpatient   Procedures: ECHO 11.25.2015: estimated ejection fraction was in the range of 45% to 50%. There is akinesis of the mid-apicalanteroseptal myocardium. Features are consistent with apseudonormal left ventricular filling pattern, with concomitant abnormal relaxation and increased filling pressure (grade 2 diastolic dysfunction).  Antibiotics:  None  HPI/Subjective: No complains, a little more groggy today.  Objective: Filed Vitals:   01/28/14 2218 01/28/14 2238 01/29/14 0627 01/29/14 0951  BP: 119/46 180/73 144/57 151/66  Pulse: 88 90 72 90  Temp: 97.7 F (36.5 C)  98.8 F (37.1 C)   TempSrc: Oral  Oral   Resp: 20  18   Height:      Weight:   80.922 kg (178 lb 6.4 oz)   SpO2: 97% 98% 98%      Exam:  General: Alert, awake, confused , in no acute distress.  HEENT: No bruits, no goiter. +JVD Heart: Regular rate and rhythm. Lungs: Good air movement, clear Abdomen: Soft, nontender, nondistended, positive bowel sounds.  Neuro: Grossly intact, nonfocal.   Data Reviewed: Basic Metabolic Panel:  Recent Labs Lab 01/26/14 0752 01/27/14 0358 01/28/14 0857  NA 143 146 143  K  3.9 3.9 4.3  CL 102 101 99  CO2  --  28 32  GLUCOSE 125* 173* 120*  BUN 46* 49* 50*  CREATININE 2.60* 2.29* 2.19*  CALCIUM  --  8.8 9.1   Liver Function Tests:  Recent Labs Lab 01/27/14 0358  AST 17  ALT 11  ALKPHOS 70  BILITOT 0.2*  PROT 6.7  ALBUMIN 3.0*   No results for input(s): LIPASE, AMYLASE in the last 168  hours. No results for input(s): AMMONIA in the last 168 hours. CBC:  Recent Labs Lab 01/26/14 0752 01/26/14 2302 01/27/14 0358 01/28/14 0437 01/29/14 0402  WBC  --  4.9 4.6 9.7 7.4  HGB 9.5* 8.6* 8.7* 8.7* 8.0*  HCT 28.0* 27.8* 28.4* 28.8* 26.7*  MCV  --  93.0 94.0 93.5 91.1  PLT  --  168 170 178 166   Cardiac Enzymes: No results for input(s): CKTOTAL, CKMB, CKMBINDEX, TROPONINI in the last 168 hours. BNP (last 3 results) No results for input(s): PROBNP in the last 8760 hours. CBG:  Recent Labs Lab 01/28/14 0641 01/28/14 1115 01/28/14 1607 01/28/14 2122 01/29/14 0631  GLUCAP 129* 137* 168* 173* 127*    No results found for this or any previous visit (from the past 240 hour(s)).   Studies: Ct Head Wo Contrast  01/28/2014   CLINICAL DATA:  CVA  EXAM: CT HEAD WITHOUT CONTRAST  TECHNIQUE: Contiguous axial images were obtained from the base of the skull through the vertex without intravenous contrast.  COMPARISON:  01/11/2014  FINDINGS: No skull fracture is noted. Paranasal sinuses and mastoid air cells are unremarkable. No intracranial hemorrhage, mass effect or midline shift. Stable mild cerebral atrophy. Stable periventricular chronic white matter disease. No acute cortical infarction. No mass lesion is noted on this unenhanced scan.  IMPRESSION: No acute intracranial abnormality. Mild cerebral atrophy. Stable periventricular chronic mild white matter disease.   Electronically Signed   By: Natasha MeadLiviu  Pop M.D.   On: 01/28/2014 14:57    Scheduled Meds: . amitriptyline  50 mg Oral QHS  . antiseptic oral rinse  7 mL Mouth Rinse BID  . carvedilol  6.25 mg Oral BID WC  . cholecalciferol  2,000 Units Oral Daily  . collagenase  1 application Topical Daily  . vitamin B-12  500 mcg Oral Daily  . famotidine  20 mg Oral Daily  . furosemide  40 mg Oral Daily  . hydrALAZINE  50 mg Oral TID  . insulin aspart  0-15 Units Subcutaneous TID WC  . iron polysaccharides  150 mg Oral BID  .  isosorbide dinitrate  40 mg Oral TID  . pantoprazole  40 mg Oral Daily  . sodium chloride  3 mL Intravenous Q12H  . vitamin C  500 mg Oral Daily  . Warfarin - Pharmacist Dosing Inpatient   Does not apply q1800  . zinc sulfate  220 mg Oral Daily   Continuous Infusions:     Clarise Chacko  [facility] If 8PM-8AM, please contact night-coverage at [email or web address], password [facility] 01/29/2014, 10:56 AM  LOS: 3 days

## 2014-01-29 NOTE — Progress Notes (Signed)
   Daily Progress Note  Assessment/Planning: L foot gangrene, AMS   Difficulty arousing pt this AM.  Suspect medications contributing.  Appreciate Int Med taking over  Pt has been offered a L AKA previously but declined as did the family.  Per Dr . Edilia Boickson the plan is to d/c back to her SNF and continue outpatient wound care in [place].  Subjective  - 3 Days Post-Op  Difficult to arouse  Objective Filed Vitals:   01/28/14 1100 01/28/14 2218 01/28/14 2238 01/29/14 0627  BP: 160/98 119/46 180/73 144/57  Pulse: 98 88 90 72  Temp:  97.7 F (36.5 C)  98.8 F (37.1 C)  TempSrc:  Oral  Oral  Resp: 16 20  18   Height:      Weight:    178 lb 6.4 oz (80.922 kg)  SpO2: 99% 97% 98% 98%    Intake/Output Summary (Last 24 hours) at 01/29/14 0733 Last data filed at 01/29/14 0500  Gross per 24 hour  Intake  926.6 ml  Output    700 ml  Net  226.6 ml    VASC  L foot bandaged  Laboratory CBC    Component Value Date/Time   WBC 7.4 01/29/2014 0402   HGB 8.0* 01/29/2014 0402   HCT 26.7* 01/29/2014 0402   PLT 166 01/29/2014 0402    BMET    Component Value Date/Time   NA 143 01/28/2014 0857   K 4.3 01/28/2014 0857   CL 99 01/28/2014 0857   CO2 32 01/28/2014 0857   GLUCOSE 120* 01/28/2014 0857   BUN 50* 01/28/2014 0857   CREATININE 2.19* 01/28/2014 0857   CALCIUM 9.1 01/28/2014 0857   GFRNONAA 21* 01/28/2014 0857   GFRAA 24* 01/28/2014 0857    Leonides SakeBrian Jla Reynolds, MD [facility] [place] Office: [phone] Pager: [phone]  01/29/2014, 7:33 AM

## 2014-01-30 DIAGNOSIS — I70242 Atherosclerosis of native arteries of left leg with ulceration of calf: Secondary | ICD-10-CM

## 2014-01-30 LAB — BASIC METABOLIC PANEL
Anion gap: 13 (ref 5–15)
BUN: 49 mg/dL — AB (ref 6–23)
CO2: 29 mEq/L (ref 19–32)
CREATININE: 2.2 mg/dL — AB (ref 0.50–1.10)
Calcium: 8.8 mg/dL (ref 8.4–10.5)
Chloride: 99 mEq/L (ref 96–112)
GFR calc non Af Amer: 20 mL/min — ABNORMAL LOW (ref >90)
GFR, EST AFRICAN AMERICAN: 24 mL/min — AB (ref >90)
GLUCOSE: 153 mg/dL — AB (ref 70–99)
Potassium: 4.2 mEq/L (ref 3.7–5.3)
Sodium: 141 mEq/L (ref 137–147)

## 2014-01-30 LAB — CBC
HCT: 27.4 % — ABNORMAL LOW (ref 36.0–46.0)
Hemoglobin: 8.3 g/dL — ABNORMAL LOW (ref 12.0–15.0)
MCH: 27.6 pg (ref 26.0–34.0)
MCHC: 30.3 g/dL (ref 30.0–36.0)
MCV: 91 fL (ref 78.0–100.0)
PLATELETS: 163 10*3/uL (ref 150–400)
RBC: 3.01 MIL/uL — ABNORMAL LOW (ref 3.87–5.11)
RDW: 17.1 % — ABNORMAL HIGH (ref 11.5–15.5)
WBC: 8.7 10*3/uL (ref 4.0–10.5)

## 2014-01-30 LAB — GLUCOSE, CAPILLARY
GLUCOSE-CAPILLARY: 167 mg/dL — AB (ref 70–99)
GLUCOSE-CAPILLARY: 156 mg/dL — AB (ref 70–99)
Glucose-Capillary: 128 mg/dL — ABNORMAL HIGH (ref 70–99)

## 2014-01-30 LAB — PROTIME-INR
INR: 3.35 — ABNORMAL HIGH (ref 0.00–1.49)
PROTHROMBIN TIME: 34.2 s — AB (ref 11.6–15.2)

## 2014-01-30 MED ORDER — HYDRALAZINE HCL 50 MG PO TABS: 50.0000 mg | ORAL_TABLET | Freq: Three times a day (TID) | ORAL | Status: AC

## 2014-01-30 MED ORDER — WARFARIN SODIUM 4 MG PO TABS: 4.0000 mg | ORAL_TABLET | Freq: Every day | ORAL | Status: AC

## 2014-01-30 MED ORDER — ONDANSETRON HCL 4 MG/2ML IJ SOLN: 4.0000 mg | Freq: Four times a day (QID) | INTRAMUSCULAR | Status: AC | PRN

## 2014-01-30 MED ORDER — PANTOPRAZOLE SODIUM 40 MG PO TBEC: 40.0000 mg | DELAYED_RELEASE_TABLET | Freq: Every day | ORAL | Status: AC

## 2014-01-30 MED ORDER — ISOSORBIDE DINITRATE 40 MG PO TABS: 40.0000 mg | ORAL_TABLET | Freq: Three times a day (TID) | ORAL | Status: AC

## 2014-01-30 MED ORDER — IPRATROPIUM-ALBUTEROL 0.5-2.5 (3) MG/3ML IN SOLN: 3.0000 mL | Freq: Four times a day (QID) | RESPIRATORY_TRACT | Status: AC | PRN

## 2014-01-30 MED ORDER — VITAMIN D3 25 MCG (1000 UNIT) PO TABS: 2000.0000 [IU] | ORAL_TABLET | Freq: Every day | ORAL | Status: DC

## 2014-01-30 MED ORDER — CYANOCOBALAMIN 500 MCG PO TABS: 500.0000 ug | ORAL_TABLET | Freq: Every day | ORAL | Status: AC

## 2014-01-30 MED ORDER — INSULIN ASPART 100 UNIT/ML ~~LOC~~ SOLN: 0.0000 [IU] | Freq: Three times a day (TID) | SUBCUTANEOUS | Status: AC

## 2014-01-30 MED ORDER — CARVEDILOL 6.25 MG PO TABS: 6.2500 mg | ORAL_TABLET | Freq: Two times a day (BID) | ORAL | Status: AC

## 2014-01-30 MED ORDER — GUAIFENESIN-DM 100-10 MG/5ML PO SYRP: 15.0000 mL | ORAL_SOLUTION | ORAL | Status: AC | PRN

## 2014-01-30 NOTE — Progress Notes (Signed)
Report given to Amy ,[facility]

## 2014-01-30 NOTE — Plan of Care (Signed)
Problem: Phase II Progression Outcomes Goal: Pain controlled Outcome: Completed/Met Date Met:  01/30/14 Goal: Tolerating diet Outcome: Completed/Met Date Met:  01/30/14

## 2014-01-30 NOTE — Progress Notes (Signed)
PT Cancellation Note  Patient Details Name: [name] MRN: [id number] DOB: 05/11/1934   Cancelled Treatment:    Reason Eval/Treat Not Completed: Other (comment) (Pt to be dc'd back to LTAC today. Defer PT to LTAC.)   [facility] Center[facility],[name] 01/30/2014, 3:29 PM

## 2014-01-30 NOTE — Discharge Summary (Addendum)
[address] [place], 79 y.o., DOB 08-11-1934, MRN [id number]. Admission date: 01/26/2014 Discharge Date 01/30/2014 Primary MD Estanislado Pandy, MD Admitting Physician Chuck Hint, MD  Admission Diagnosis  pvd with left foot ulcer  Accepting facility : -Please continue to dose warfarin for cognitive INR 2-3 for atrial fibrillation. -Please continue with wound care for extremity. -Monitor patient diuresis for acute diastolic CHF, Lasix increased on discharge to 40 mg oral twice a day.   Discharge Diagnosis   Principal Problem:   Acute encephalopathy Active Problems:   Chronic atrial fibrillation   Stroke   Warfarin anticoagulation   CKD (chronic kidney disease) stage 3, GFR 30-59 ml/min   Atherosclerosis of native arteries of the extremities with gangrene   Acute on chronic diastolic CHF (congestive heart failure)   Acute on chronic renal failure   Anemia   Diabetes mellitus with peripheral artery disease     Past Medical History  Diagnosis Date  . Paroxysmal atrial fibrillation     CHADS2 (4). Coumadin Rx  . Hypertension   . Stroke     dysarthria, resolved 2009  . Renal insufficiency     chronic  . Dyslipidemia   . Peripheral neuropathy   . Diabetes mellitus   . Amputation, traumatic, toes     two toes right foot  . Cataract 2011  . Ejection fraction     EF 60-65% 09/2007, echo  . Warfarin anticoagulation     Atrial fib  . Vertigo     Vertigo with a fall 03/2012    Past Surgical History  Procedure Laterality Date  . Appendectomy    . Cholecystectomy    . Toe amputation      RIGHT   Admission history of present illness/brief narrative: 79 year old with past medical history of chronic diastolic heart failure with anemia of 55%, past medical history of chronic atrial fibrillation on warfarin, diabetes with diabetic foot ulcer that being followed as an outpatient, history of CVA in 2009 and hypertension that was admitted on the day prior to consult by vascular  surgery for a abdominal aortogram as she was being laid flat on the table she started complaining of shortness of breath and cardiology was consulted. She was admitted to the hospital and started diuresis,  her creatinine continues to improve with diuresis. Patient hospital stay was complicated by acute encephalopathy, CT head did not show any acute findings, acute encephalopathy was thought to be most likely secondary to acute delirium with baseline dementia.  Hospital Course See H&P, Labs, Consult and Test reports for all details in brief, patient was admitted for **  Principal Problem:   Acute encephalopathy Active Problems:   Chronic atrial fibrillation   Stroke   Warfarin anticoagulation   CKD (chronic kidney disease) stage 3, GFR 30-59 ml/min   Atherosclerosis of native arteries of the extremities with gangrene   Acute on chronic diastolic CHF (congestive heart failure)   Acute on chronic renal failure   Anemia   Diabetes mellitus with peripheral artery disease  Acute encephalopathy: - Appears multifactorial but Most likely medication induce she did get ativan for the procedure,  - as well as possible acute delirium, and hypercarbia. - Will avoid narcotics and BZD's. - Can use haldol if needed. Pt cont to improve with avoiding sedatives. - She is more awake at day of discharge. She might have underlying dementia although the family denies it. She is definitively having delirium as she mentation fluctuates through out the day. Use Haldol. -  Abg repeated and cont to improve.    COPD -Patient does not have active wheezing, does not appear to be in acute exacerbation, hypercarbia most likely worsened secondary to medication. -Continue with nebulizer as needed  Acute on chronic diastolic CHF (congestive heart failure) - Per cardiology with moderate UOP still with JVD and lower ext edema, and evidence of filling overload on x-ray, diuresis as per cardiology, -1000  mL since  admission. -Continues to improve since admission, creatinine was 2.29 at [facility] last October as per vascular surgery note . -Patient will be discharged on 40 mg Lasix twice a day as per cardiology recommendation.  Acute on CKD (chronic kidney disease) stage 3, GFR 30-59 ml/min/Cardiorenal syndrome: -Monitor closely as patient is on diuresis, creatinine is 2.2 at day of discharge.   Diabetes mellitus with peripheral artery disease - Good control.  - Hold Lantus , continue with insulin sliding scale on discharge.   Chronic atrial fibrillation/Warfarin anticoagulation: - Continue Coumadin per cardiology. -Continue with Coreg for rate control   Atherosclerosis of native arteries of the extremities with gangrene - Further management per vascular surgery, patient could not tolerate the procedure over angioplasty, recommendation as per vascular surgery is left above-knee amputation, initially family wanted to pursue conservative management, but at this point they are agreeable to left above-knee amputation, vascular surgery planning to do it when patient is more stable, family will be contacted by vascular surgery office to schedule for the procedure after discharge. - wound care consult at the receiving facility.   Code Status: full Family Communication: daughter in law  Disposition Plan: Candidate LTAC   Procedures: ECHO 11.25.2015: estimated ejection fraction was in the range of 45% to 50%. There is akinesis of the mid-apicalanteroseptal myocardium. Features are consistent with apseudonormal left ventricular filling pattern, with concomitant abnormal relaxation and increased filling pressure (grade 2 diastolic dysfunction).    Significant Tests:  See full reports for all details    Ct Head Wo Contrast  01/28/2014   CLINICAL DATA:  CVA  EXAM: CT HEAD WITHOUT CONTRAST  TECHNIQUE: Contiguous axial images were obtained from the base of the skull through the vertex  without intravenous contrast.  COMPARISON:  01/11/2014  FINDINGS: No skull fracture is noted. Paranasal sinuses and mastoid air cells are unremarkable. No intracranial hemorrhage, mass effect or midline shift. Stable mild cerebral atrophy. Stable periventricular chronic white matter disease. No acute cortical infarction. No mass lesion is noted on this unenhanced scan.  IMPRESSION: No acute intracranial abnormality. Mild cerebral atrophy. Stable periventricular chronic mild white matter disease.   Electronically Signed   By: Natasha Mead M.D.   On: 01/28/2014 14:57   Dg Chest Port 1 View  01/27/2014   CLINICAL DATA:  Shortness of breath.  EXAM: PORTABLE CHEST - 1 VIEW  COMPARISON:  01/14/2014  FINDINGS: Mild cardiomegaly. Bilateral airspace opacities likely reflects mild edema. There are bilateral pleural effusions, small on the left and moderate on the right. The right effusion is increased since prior study. Probable bibasilar atelectasis.  IMPRESSION: Mild CHF. Small left effusion and enlarging moderate right effusion with bibasilar atelectasis.   Electronically Signed   By: Charlett Nose M.D.   On: 01/27/2014 10:15     Today   Subjective:   [name] today has no headache,no chest or abdominal pain, had a good night sleep yesterday.  Objective:   Blood pressure 157/57, pulse 79, temperature 98.2 F (36.8 C), temperature source Axillary, resp. rate 18, height 5\' 6"  (  1.676 m), weight 79.8 kg (175 lb 14.8 oz), SpO2 96 %.  Intake/Output Summary (Last 24 hours) at 01/30/14 1540 Last data filed at 01/30/14 1222  Gross per 24 hour  Intake    265 ml  Output    525 ml  Net   -260 ml    Exam Awake Alert, confused, No new F.N deficits, [place].AT,PERRAL Supple Neck,No JVD, No cervical lymphadenopathy appriciated.  Symmetrical Chest wall movement, Good air movement bilaterally, CTAB RRR,No Gallops,Rubs or new Murmurs, No Parasternal Heave +ve B.Sounds, Abd Soft, Non tender, No organomegaly  appriciated, No rebound -guarding or rigidity. No Cyanosis, Clubbing or edema, No new Rash or bruise has left lower extremity wound and bandage.  Data Review     CBC w Diff:  Lab Results  Component Value Date   WBC 8.7 01/30/2014   HGB 8.3* 01/30/2014   HCT 27.4* 01/30/2014   PLT 163 01/30/2014   CMP:  Lab Results  Component Value Date   NA 141 01/30/2014   K 4.2 01/30/2014   CL 99 01/30/2014   CO2 29 01/30/2014   BUN 49* 01/30/2014   CREATININE 2.20* 01/30/2014   PROT 6.7 01/27/2014   ALBUMIN 3.0* 01/27/2014   BILITOT 0.2* 01/27/2014   ALKPHOS 70 01/27/2014   AST 17 01/27/2014   ALT 11 01/27/2014  .  Micro Results No results found for this or any previous visit (from the past 240 hour(s)).   Discharge Instructions     Please have wound care consulted and follow wound care instructions, continue to dose warfarin for target INR 2 - 3, INR at day of discharge is 3.35.   Discharge Medications     Medication List    STOP taking these medications        BOOST PLUS PO     cloNIDine 0.1 MG tablet  Commonly known as:  CATAPRES     famotidine 20 MG tablet  Commonly known as:  PEPCID     insulin glargine 100 UNIT/ML injection  Commonly known as:  LANTUS     insulin lispro 100 UNIT/ML injection  Commonly known as:  HUMALOG     isosorbide mononitrate 60 MG 24 hr tablet  Commonly known as:  IMDUR     labetalol 300 MG tablet  Commonly known as:  NORMODYNE     metoprolol succinate 50 MG 24 hr tablet  Commonly known as:  TOPROL-XL     multivitamin tablet      TAKE these medications        acetaminophen 325 MG tablet  Commonly known as:  TYLENOL  Take 650 mg by mouth every 6 (six) hours as needed for mild pain.     ALPRAZolam 0.25 MG tablet  Commonly known as:  XANAX  Take 0.25 mg by mouth 3 (three) times daily as needed for anxiety.     amitriptyline 50 MG tablet  Commonly known as:  ELAVIL  Take 50 mg by mouth at bedtime.     carvedilol 6.25 MG  tablet  Commonly known as:  COREG  Take 1 tablet (6.25 mg total) by mouth 2 (two) times daily with a meal.     Cholecalciferol 2000 UNITS Tabs  Take 2,000 Units by mouth daily.     collagenase ointment  Commonly known as:  SANTYL  Apply 1 application topically daily. Apply to left lower extremity     cyanocobalamin 500 MCG tablet  Take 1 tablet (500 mcg total) by mouth daily.  darbepoetin 60 MCG/0.3ML Soln injection  Commonly known as:  ARANESP  Inject 60 mcg into the skin every 7 (seven) days.     furosemide 40 MG tablet  Commonly known as:  LASIX  Take 40 mg by mouth 2 (two) times daily.     guaiFENesin-dextromethorphan 100-10 MG/5ML syrup  Commonly known as:  ROBITUSSIN DM  Take 15 mLs by mouth every 4 (four) hours as needed for cough.     hydrALAZINE 50 MG tablet  Commonly known as:  APRESOLINE  Take 1 tablet (50 mg total) by mouth 3 (three) times daily.     HYDROcodone-acetaminophen 5-325 MG per tablet  Commonly known as:  NORCO/VICODIN  Take 1 tablet by mouth every 8 (eight) hours as needed (for pain).     hydrocortisone 2.5 % rectal cream  Commonly known as:  ANUSOL-HC  Place 1 application rectally every 6 (six) hours as needed for hemorrhoids.     insulin aspart 100 UNIT/ML injection  Commonly known as:  novoLOG  Inject 0-15 Units into the skin 3 (three) times daily with meals.     ipratropium-albuterol 0.5-2.5 (3) MG/3ML Soln  Commonly known as:  DUONEB  Take 3 mLs by nebulization every 6 (six) hours.     ipratropium-albuterol 0.5-2.5 (3) MG/3ML Soln  Commonly known as:  DUONEB  Take 3 mLs by nebulization every 6 (six) hours as needed.     iron polysaccharides 150 MG capsule  Commonly known as:  NIFEREX  Take 150 mg by mouth 2 (two) times daily with a meal.     isosorbide dinitrate 40 MG tablet  Commonly known as:  ISORDIL  Take 1 tablet (40 mg total) by mouth 3 (three) times daily.     ondansetron 4 MG/2ML Soln injection  Commonly known as:   ZOFRAN  Inject 2 mLs (4 mg total) into the vein every 6 (six) hours as needed for nausea or vomiting.     pantoprazole 40 MG tablet  Commonly known as:  PROTONIX  Take 1 tablet (40 mg total) by mouth daily.     vitamin C 500 MG tablet  Commonly known as:  ASCORBIC ACID  Take 500 mg by mouth daily.     warfarin 4 MG tablet  Commonly known as:  COUMADIN  Take 1 tablet (4 mg total) by mouth daily. Please hold warfarin for tonight 11/27, will recheck INR level on 11/28, and resume warfarin as per pharmacy/physician recommendation.  Start taking on:  01/31/2014     zinc sulfate 220 MG capsule  Take 220 mg by mouth daily.         Total Time in preparing paper work, data evaluation and todays exam - 35 minutes  Aleshia Cartelli M.D on 01/30/2014 at 3:40 PM  [facility] Office  [phone]

## 2014-01-30 NOTE — Discharge Instructions (Signed)
°-  These have your labs checked regularly at [facility], including daily CBC, CMP, INR for the next 3 day thereafter as per physician discretion. -Please continue with wound care as per wound consult recommendation of the  lower extremity wound. -Continue with warfarin, septic facility to dose and monitor warfarin, ragged INR is 2-3 for A. Fib.

## 2014-01-30 NOTE — Progress Notes (Signed)
Transferred to [facility] by [facility] Transport team .pt awake,

## 2014-01-30 NOTE — Progress Notes (Signed)
Patient ID: [name], female   DOB: 10/06/1934, 79 y.o.   MRN: [id number]   Primary Cardiologist: Dr Myrtis SerKatz  SUBJECTIVE:  Mental status markedly improved since the last time I saw her 2 days ago. Was able to carry a normal conversation without falling asleep. Able to answer location, year and president correctly. She is not complaining of chest pain, however does complain of SOB. CT of the head done showed no acute abnormality. Sitter at bedside   Filed Vitals:   01/29/14 1633 01/29/14 1952 01/29/14 2200 01/30/14 0428  BP: 150/51 135/46  162/74  Pulse: 79 72  77  Temp: 99.1 F (37.3 C) 97.8 F (36.6 C)  97.6 F (36.4 C)  TempSrc: Oral Oral  Axillary  Resp: 20 18  18   Height:      Weight:    175 lb 14.8 oz (79.8 kg)  SpO2: 96% 91% 96% 95%     Intake/Output Summary (Last 24 hours) at 01/30/14 0740 Last data filed at 01/29/14 2200  Gross per 24 hour  Intake      0 ml  Output    250 ml  Net   -250 ml    LABS: Basic Metabolic Panel:  Recent Labs  91/47/8211/25/15 0857 01/30/14 0455  NA 143 141  K 4.3 4.2  CL 99 99  CO2 32 29  GLUCOSE 120* 153*  BUN 50* 49*  CREATININE 2.19* 2.20*  CALCIUM 9.1 8.8   CBC:  Recent Labs  01/29/14 0402 01/30/14 0455  WBC 7.4 8.7  HGB 8.0* 8.3*  HCT 26.7* 27.4*  MCV 91.1 91.0  PLT 166 163     PHYSICAL EXAM Patient more alert, oriented x 3 to location, yr and president. Lung exam continues to reveal some fine crackles with decreased breath sound in bilateral bases. Cardiac exam reveals S1 and S2. The rhythm is regular. Abdomen is soft, nontender, hyperactive. The left foot is wrapped with mild LLE edema. There is no marked edema on the right.    ASSESSMENT AND PLAN:    Acute encephalopathy     This is being managed by the internal medicine team.    Chronic atrial fibrillation - appears in NSR with p waves noted on telemetry     Heart rate is controlled.    Warfarin anticoagulation     Coumadin is being managed by pharmacy.   CKD (chronic kidney disease) stage 3, GFR 30-59 ml/min     Cr stable after aggressive diuresis    Atherosclerosis of native arteries of the extremities with gangrene     Patient is followed by the vascular surgery team. The plan will be for return to LTAC/SNF and [facility]. Apparently pt has declined L AKA    Acute on chronic diastolic CHF (congestive heart failure)     Difficult to assess fluid status. EF mildly decreased on echo with ?akinesis of regional cardiac wall, no plan for invasive test now given her debilitation. Continue to have some SOB, weight down from 180 to 175. ?I/O accuracy which shows almost no PO intake yesterday. May need 40mg  BID of PO lasix instead of daily dosing.      Still very debilitated, per case manager, patient came from LTAC instead of SNF, may benefit from going back to [facility] after adequately manage her acute diastolic HF.    Azalee CourseMeng, Hao 01/30/2014 7:40 AM  Attending Note:   The patient was seen and examined.  Agree with assessment and plan as  noted above.  Changes made to the above note as needed.  Patient clearly has waxing and waning mental status.  She was minimaly responsive by my exam today. Did not say anything about being short of breath. Would barely wake up when I called her name and shook her.     She will not stay in bed.  Instead, she insists on laying on her right side with her legs dangling down towards the floor.   She is very hard to evaluate.  At this point, she is not a candidate for any invasive testing or procedures.   She may need to be considered for Palliative care unless she "wakes up".  Her PO intake is minimal   No further recs. Will sign off. Call for questions.   Vesta MixerPhilip J. Asusena Sigley, Montez HagemanJr., MD, [facility] 01/30/2014, 11:16 AM [address],  [address] Office [phone] Pager [phone]- [phone]

## 2014-01-30 NOTE — Progress Notes (Signed)
    Reportedly, family now willing to proceed with L AKA.  With full anticoagulation currently, she obviously is not a candidate for the L AKA.  Will plan on bring the patient back to the office in 2 weeks once her mental status issues are improved and see if she is ready for the L AKA at that point.  My office will arrange the follow up for her with Dr. Edilia Boickson.   Lab Results  Component Value Date   INR 3.35* 01/30/2014   INR 3.19* 01/29/2014   INR 1.82* 01/28/2014      Leonides SakeBrian Chen, MD [facility] Office: [phone] Pager: [phone]  01/30/2014, 2:53 PM

## 2014-01-31 LAB — GLUCOSE, CAPILLARY: Glucose-Capillary: 54 mg/dL — ABNORMAL LOW (ref 70–99)

## 2014-02-02 ENCOUNTER — Telehealth: Payer: Self-pay | Admitting: Vascular Surgery

## 2014-02-02 NOTE — Telephone Encounter (Addendum)
-----   Message from Sharee PimpleMarilyn K McChesney, RN sent at 02/02/2014  9:49 AM EST ----- Regarding: Schedule   ----- Message -----    From: Fransisco HertzBrian L Chen, MD    Sent: 01/30/2014   2:57 PM      To: [facility] [address]  Cece York Grice Rump [phone] 10/19/1934  Needs follow up with Dr. Edilia Boickson in 2 weeks. Re: evaluation for AKA   02/02/14: lm for pt re appt, dpm

## 2014-02-04 ENCOUNTER — Encounter (HOSPITAL_COMMUNITY): Payer: Self-pay | Admitting: General Practice

## 2014-02-04 ENCOUNTER — Inpatient Hospital Stay (HOSPITAL_COMMUNITY): Payer: Medicare Other

## 2014-02-04 ENCOUNTER — Inpatient Hospital Stay (HOSPITAL_COMMUNITY)
Admission: AD | Admit: 2014-02-04 | Discharge: 2014-03-06 | DRG: 579 | Disposition: E | Payer: Medicare Other | Source: Other Acute Inpatient Hospital | Attending: Pulmonary Disease | Admitting: Pulmonary Disease

## 2014-02-04 DIAGNOSIS — E785 Hyperlipidemia, unspecified: Secondary | ICD-10-CM | POA: Diagnosis present

## 2014-02-04 DIAGNOSIS — G629 Polyneuropathy, unspecified: Secondary | ICD-10-CM | POA: Diagnosis present

## 2014-02-04 DIAGNOSIS — N189 Chronic kidney disease, unspecified: Secondary | ICD-10-CM

## 2014-02-04 DIAGNOSIS — I5043 Acute on chronic combined systolic (congestive) and diastolic (congestive) heart failure: Secondary | ICD-10-CM | POA: Diagnosis not present

## 2014-02-04 DIAGNOSIS — Z888 Allergy status to other drugs, medicaments and biological substances status: Secondary | ICD-10-CM

## 2014-02-04 DIAGNOSIS — Z7722 Contact with and (suspected) exposure to environmental tobacco smoke (acute) (chronic): Secondary | ICD-10-CM | POA: Diagnosis present

## 2014-02-04 DIAGNOSIS — E872 Acidosis: Secondary | ICD-10-CM | POA: Diagnosis not present

## 2014-02-04 DIAGNOSIS — J9622 Acute and chronic respiratory failure with hypercapnia: Secondary | ICD-10-CM | POA: Diagnosis not present

## 2014-02-04 DIAGNOSIS — G931 Anoxic brain damage, not elsewhere classified: Secondary | ICD-10-CM | POA: Diagnosis not present

## 2014-02-04 DIAGNOSIS — N179 Acute kidney failure, unspecified: Secondary | ICD-10-CM | POA: Diagnosis not present

## 2014-02-04 DIAGNOSIS — L97509 Non-pressure chronic ulcer of other part of unspecified foot with unspecified severity: Secondary | ICD-10-CM | POA: Insufficient documentation

## 2014-02-04 DIAGNOSIS — I739 Peripheral vascular disease, unspecified: Secondary | ICD-10-CM | POA: Diagnosis present

## 2014-02-04 DIAGNOSIS — L089 Local infection of the skin and subcutaneous tissue, unspecified: Secondary | ICD-10-CM | POA: Diagnosis present

## 2014-02-04 DIAGNOSIS — A419 Sepsis, unspecified organism: Secondary | ICD-10-CM | POA: Diagnosis not present

## 2014-02-04 DIAGNOSIS — E1122 Type 2 diabetes mellitus with diabetic chronic kidney disease: Secondary | ICD-10-CM

## 2014-02-04 DIAGNOSIS — Z8673 Personal history of transient ischemic attack (TIA), and cerebral infarction without residual deficits: Secondary | ICD-10-CM | POA: Diagnosis not present

## 2014-02-04 DIAGNOSIS — E119 Type 2 diabetes mellitus without complications: Secondary | ICD-10-CM

## 2014-02-04 DIAGNOSIS — Z885 Allergy status to narcotic agent status: Secondary | ICD-10-CM | POA: Diagnosis not present

## 2014-02-04 DIAGNOSIS — Z993 Dependence on wheelchair: Secondary | ICD-10-CM | POA: Diagnosis not present

## 2014-02-04 DIAGNOSIS — R0600 Dyspnea, unspecified: Secondary | ICD-10-CM

## 2014-02-04 DIAGNOSIS — Z833 Family history of diabetes mellitus: Secondary | ICD-10-CM | POA: Diagnosis not present

## 2014-02-04 DIAGNOSIS — Z7901 Long term (current) use of anticoagulants: Secondary | ICD-10-CM

## 2014-02-04 DIAGNOSIS — N184 Chronic kidney disease, stage 4 (severe): Secondary | ICD-10-CM | POA: Diagnosis present

## 2014-02-04 DIAGNOSIS — M1990 Unspecified osteoarthritis, unspecified site: Secondary | ICD-10-CM | POA: Diagnosis present

## 2014-02-04 DIAGNOSIS — I255 Ischemic cardiomyopathy: Secondary | ICD-10-CM | POA: Diagnosis present

## 2014-02-04 DIAGNOSIS — E1169 Type 2 diabetes mellitus with other specified complication: Secondary | ICD-10-CM | POA: Diagnosis present

## 2014-02-04 DIAGNOSIS — Z01811 Encounter for preprocedural respiratory examination: Secondary | ICD-10-CM

## 2014-02-04 DIAGNOSIS — G9340 Encephalopathy, unspecified: Secondary | ICD-10-CM

## 2014-02-04 DIAGNOSIS — I482 Chronic atrial fibrillation: Secondary | ICD-10-CM

## 2014-02-04 DIAGNOSIS — Z794 Long term (current) use of insulin: Secondary | ICD-10-CM | POA: Diagnosis not present

## 2014-02-04 DIAGNOSIS — L97529 Non-pressure chronic ulcer of other part of left foot with unspecified severity: Principal | ICD-10-CM | POA: Diagnosis present

## 2014-02-04 DIAGNOSIS — I272 Other secondary pulmonary hypertension: Secondary | ICD-10-CM | POA: Diagnosis present

## 2014-02-04 DIAGNOSIS — Z66 Do not resuscitate: Secondary | ICD-10-CM | POA: Diagnosis present

## 2014-02-04 DIAGNOSIS — I10 Essential (primary) hypertension: Secondary | ICD-10-CM | POA: Diagnosis present

## 2014-02-04 DIAGNOSIS — I469 Cardiac arrest, cause unspecified: Secondary | ICD-10-CM | POA: Diagnosis not present

## 2014-02-04 DIAGNOSIS — N183 Chronic kidney disease, stage 3 unspecified: Secondary | ICD-10-CM | POA: Diagnosis present

## 2014-02-04 DIAGNOSIS — I509 Heart failure, unspecified: Secondary | ICD-10-CM

## 2014-02-04 DIAGNOSIS — I129 Hypertensive chronic kidney disease with stage 1 through stage 4 chronic kidney disease, or unspecified chronic kidney disease: Secondary | ICD-10-CM | POA: Diagnosis present

## 2014-02-04 DIAGNOSIS — I5032 Chronic diastolic (congestive) heart failure: Secondary | ICD-10-CM | POA: Diagnosis present

## 2014-02-04 DIAGNOSIS — F29 Unspecified psychosis not due to a substance or known physiological condition: Secondary | ICD-10-CM | POA: Diagnosis not present

## 2014-02-04 DIAGNOSIS — J449 Chronic obstructive pulmonary disease, unspecified: Secondary | ICD-10-CM | POA: Diagnosis present

## 2014-02-04 DIAGNOSIS — E871 Hypo-osmolality and hyponatremia: Secondary | ICD-10-CM | POA: Diagnosis not present

## 2014-02-04 DIAGNOSIS — D631 Anemia in chronic kidney disease: Secondary | ICD-10-CM | POA: Diagnosis present

## 2014-02-04 DIAGNOSIS — K219 Gastro-esophageal reflux disease without esophagitis: Secondary | ICD-10-CM | POA: Diagnosis present

## 2014-02-04 DIAGNOSIS — Z8249 Family history of ischemic heart disease and other diseases of the circulatory system: Secondary | ICD-10-CM

## 2014-02-04 DIAGNOSIS — Z515 Encounter for palliative care: Secondary | ICD-10-CM

## 2014-02-04 DIAGNOSIS — I480 Paroxysmal atrial fibrillation: Secondary | ICD-10-CM | POA: Diagnosis present

## 2014-02-04 DIAGNOSIS — Z79891 Long term (current) use of opiate analgesic: Secondary | ICD-10-CM | POA: Diagnosis not present

## 2014-02-04 DIAGNOSIS — Z89421 Acquired absence of other right toe(s): Secondary | ICD-10-CM | POA: Diagnosis not present

## 2014-02-04 DIAGNOSIS — J9600 Acute respiratory failure, unspecified whether with hypoxia or hypercapnia: Secondary | ICD-10-CM | POA: Insufficient documentation

## 2014-02-04 DIAGNOSIS — R0689 Other abnormalities of breathing: Secondary | ICD-10-CM

## 2014-02-04 DIAGNOSIS — R092 Respiratory arrest: Secondary | ICD-10-CM

## 2014-02-04 DIAGNOSIS — I70242 Atherosclerosis of native arteries of left leg with ulceration of calf: Secondary | ICD-10-CM | POA: Diagnosis not present

## 2014-02-04 HISTORY — DX: Reserved for inherently not codable concepts without codable children: IMO0001

## 2014-02-04 HISTORY — DX: Chronic kidney disease, unspecified: N18.9

## 2014-02-04 HISTORY — DX: Adverse effect of unspecified anesthetic, initial encounter: T41.45XA

## 2014-02-04 HISTORY — DX: Unspecified osteoarthritis, unspecified site: M19.90

## 2014-02-04 HISTORY — DX: Gastro-esophageal reflux disease without esophagitis: K21.9

## 2014-02-04 HISTORY — DX: Other complications of anesthesia, initial encounter: T88.59XA

## 2014-02-04 HISTORY — DX: Transient cerebral ischemic attack, unspecified: G45.9

## 2014-02-04 HISTORY — DX: Calculus of kidney: N20.0

## 2014-02-04 HISTORY — DX: Chronic obstructive pulmonary disease, unspecified: J44.9

## 2014-02-04 HISTORY — DX: Chronic kidney disease, stage 4 (severe): N18.4

## 2014-02-04 HISTORY — DX: Long term (current) use of insulin: Z79.4

## 2014-02-04 HISTORY — DX: Type 2 diabetes mellitus without complications: E11.9

## 2014-02-04 HISTORY — DX: Heart failure, unspecified: I50.9

## 2014-02-04 LAB — CBC WITH DIFFERENTIAL/PLATELET
BASOS ABS: 0 10*3/uL (ref 0.0–0.1)
BASOS PCT: 0 % (ref 0–1)
EOS ABS: 0.2 10*3/uL (ref 0.0–0.7)
EOS PCT: 3 % (ref 0–5)
HCT: 28.7 % — ABNORMAL LOW (ref 36.0–46.0)
Hemoglobin: 8.8 g/dL — ABNORMAL LOW (ref 12.0–15.0)
Lymphocytes Relative: 16 % (ref 12–46)
Lymphs Abs: 1 10*3/uL (ref 0.7–4.0)
MCH: 27.1 pg (ref 26.0–34.0)
MCHC: 30.7 g/dL (ref 30.0–36.0)
MCV: 88.3 fL (ref 78.0–100.0)
Monocytes Absolute: 0.7 10*3/uL (ref 0.1–1.0)
Monocytes Relative: 12 % (ref 3–12)
NEUTROS PCT: 69 % (ref 43–77)
Neutro Abs: 4.3 10*3/uL (ref 1.7–7.7)
PLATELETS: 207 10*3/uL (ref 150–400)
RBC: 3.25 MIL/uL — ABNORMAL LOW (ref 3.87–5.11)
RDW: 16.2 % — AB (ref 11.5–15.5)
WBC: 6.2 10*3/uL (ref 4.0–10.5)

## 2014-02-04 LAB — PROTIME-INR
INR: 1.99 — ABNORMAL HIGH (ref 0.00–1.49)
PROTHROMBIN TIME: 22.8 s — AB (ref 11.6–15.2)

## 2014-02-04 LAB — GLUCOSE, CAPILLARY: Glucose-Capillary: 223 mg/dL — ABNORMAL HIGH (ref 70–99)

## 2014-02-04 MED ORDER — SODIUM CHLORIDE 0.9 % IJ SOLN
3.0000 mL | Freq: Two times a day (BID) | INTRAMUSCULAR | Status: DC
  Administered 2014-02-05 – 2014-02-10 (×12): 3 mL via INTRAVENOUS

## 2014-02-04 MED ORDER — HYDROCODONE-ACETAMINOPHEN 5-325 MG PO TABS
1.0000 | ORAL_TABLET | ORAL | Status: DC | PRN
  Administered 2014-02-09: 1 via ORAL
  Filled 2014-02-04: qty 1

## 2014-02-04 MED ORDER — DOCUSATE SODIUM 100 MG PO CAPS
100.0000 mg | ORAL_CAPSULE | Freq: Two times a day (BID) | ORAL | Status: DC
  Administered 2014-02-05 – 2014-02-13 (×13): 100 mg via ORAL
  Filled 2014-02-04 (×23): qty 1

## 2014-02-04 MED ORDER — ALBUTEROL SULFATE (2.5 MG/3ML) 0.083% IN NEBU: 2.5000 mg | INHALATION_SOLUTION | RESPIRATORY_TRACT | Status: DC | PRN

## 2014-02-04 MED ORDER — ISOSORBIDE DINITRATE 20 MG PO TABS
40.0000 mg | ORAL_TABLET | Freq: Three times a day (TID) | ORAL | Status: DC
  Administered 2014-02-05 – 2014-02-14 (×25): 40 mg via ORAL
  Filled 2014-02-04 (×38): qty 2

## 2014-02-04 MED ORDER — INSULIN ASPART 100 UNIT/ML ~~LOC~~ SOLN
0.0000 [IU] | SUBCUTANEOUS | Status: DC
  Administered 2014-02-05: 1 [IU] via SUBCUTANEOUS
  Administered 2014-02-05 (×2): 2 [IU] via SUBCUTANEOUS
  Administered 2014-02-05: 1 [IU] via SUBCUTANEOUS
  Administered 2014-02-06: 3 [IU] via SUBCUTANEOUS
  Administered 2014-02-06: 2 [IU] via SUBCUTANEOUS
  Administered 2014-02-06: 3 [IU] via SUBCUTANEOUS
  Administered 2014-02-07: 2 [IU] via SUBCUTANEOUS
  Administered 2014-02-07: 5 [IU] via SUBCUTANEOUS
  Administered 2014-02-07: 1 [IU] via SUBCUTANEOUS
  Administered 2014-02-07: 3 [IU] via SUBCUTANEOUS
  Administered 2014-02-07 – 2014-02-08 (×3): 1 [IU] via SUBCUTANEOUS
  Administered 2014-02-08: 3 [IU] via SUBCUTANEOUS
  Administered 2014-02-08: 2 [IU] via SUBCUTANEOUS
  Administered 2014-02-08 (×2): 1 [IU] via SUBCUTANEOUS
  Administered 2014-02-09 (×2): 2 [IU] via SUBCUTANEOUS
  Administered 2014-02-09: 5 [IU] via SUBCUTANEOUS
  Administered 2014-02-09: 3 [IU] via SUBCUTANEOUS
  Administered 2014-02-10: 7 [IU] via SUBCUTANEOUS
  Administered 2014-02-10: 5 [IU] via SUBCUTANEOUS
  Administered 2014-02-10: 2 [IU] via SUBCUTANEOUS
  Administered 2014-02-11: 1 [IU] via SUBCUTANEOUS
  Administered 2014-02-11: 3 [IU] via SUBCUTANEOUS
  Administered 2014-02-11: 1 [IU] via SUBCUTANEOUS

## 2014-02-04 MED ORDER — ACETAMINOPHEN 325 MG PO TABS: 650.0000 mg | ORAL_TABLET | Freq: Four times a day (QID) | ORAL | Status: DC | PRN

## 2014-02-04 MED ORDER — POLYSACCHARIDE IRON COMPLEX 150 MG PO CAPS
150.0000 mg | ORAL_CAPSULE | Freq: Two times a day (BID) | ORAL | Status: DC
  Administered 2014-02-05 – 2014-02-14 (×17): 150 mg via ORAL
  Filled 2014-02-04 (×24): qty 1

## 2014-02-04 MED ORDER — HYDROCODONE-ACETAMINOPHEN 5-325 MG PO TABS: 1.0000 | ORAL_TABLET | Freq: Three times a day (TID) | ORAL | Status: DC | PRN

## 2014-02-04 MED ORDER — CARVEDILOL 6.25 MG PO TABS
6.2500 mg | ORAL_TABLET | Freq: Two times a day (BID) | ORAL | Status: DC
  Administered 2014-02-05 – 2014-02-14 (×18): 6.25 mg via ORAL
  Filled 2014-02-04 (×24): qty 1

## 2014-02-04 MED ORDER — SODIUM CHLORIDE 0.9 % IJ SOLN: 3.0000 mL | INTRAMUSCULAR | Status: DC | PRN

## 2014-02-04 MED ORDER — ONDANSETRON HCL 4 MG PO TABS: 4.0000 mg | ORAL_TABLET | Freq: Four times a day (QID) | ORAL | Status: DC | PRN

## 2014-02-04 MED ORDER — PANTOPRAZOLE SODIUM 40 MG PO TBEC
40.0000 mg | DELAYED_RELEASE_TABLET | Freq: Every day | ORAL | Status: DC
  Administered 2014-02-05 – 2014-02-13 (×9): 40 mg via ORAL
  Filled 2014-02-04 (×9): qty 1

## 2014-02-04 MED ORDER — HYDRALAZINE HCL 50 MG PO TABS
50.0000 mg | ORAL_TABLET | Freq: Three times a day (TID) | ORAL | Status: DC
  Administered 2014-02-05 – 2014-02-10 (×14): 50 mg via ORAL
  Filled 2014-02-04 (×20): qty 1

## 2014-02-04 MED ORDER — SODIUM CHLORIDE 0.9 % IV SOLN: 250.0000 mL | INTRAVENOUS | Status: DC | PRN

## 2014-02-04 MED ORDER — AMITRIPTYLINE HCL 50 MG PO TABS
50.0000 mg | ORAL_TABLET | Freq: Every day | ORAL | Status: DC
  Administered 2014-02-05 – 2014-02-10 (×6): 50 mg via ORAL
  Filled 2014-02-04 (×8): qty 1

## 2014-02-04 MED ORDER — FUROSEMIDE 40 MG PO TABS
40.0000 mg | ORAL_TABLET | Freq: Two times a day (BID) | ORAL | Status: DC
  Administered 2014-02-05 – 2014-02-06 (×2): 40 mg via ORAL
  Filled 2014-02-04 (×5): qty 1

## 2014-02-04 MED ORDER — IPRATROPIUM-ALBUTEROL 0.5-2.5 (3) MG/3ML IN SOLN
3.0000 mL | Freq: Four times a day (QID) | RESPIRATORY_TRACT | Status: DC | PRN
  Administered 2014-02-09: 3 mL via RESPIRATORY_TRACT
  Filled 2014-02-04: qty 3

## 2014-02-04 MED ORDER — ACETAMINOPHEN 650 MG RE SUPP: 650.0000 mg | Freq: Four times a day (QID) | RECTAL | Status: DC | PRN

## 2014-02-04 MED ORDER — ONDANSETRON HCL 4 MG/2ML IJ SOLN
4.0000 mg | Freq: Four times a day (QID) | INTRAMUSCULAR | Status: DC | PRN
  Administered 2014-02-05 – 2014-02-12 (×4): 4 mg via INTRAVENOUS
  Filled 2014-02-04 (×4): qty 2

## 2014-02-04 NOTE — Progress Notes (Signed)
Kelli ReddenRosalie Austin - daughter in law [phone]. Son - Neta EhlersRandy Austin [phone]. Patient states ok to give her information using codeword

## 2014-02-04 NOTE — Progress Notes (Signed)
Patient has arrived to unit via [facility] to 5w23 from [facility]. Bedside report taken Kelli CorwinAngela [name]  With [facility]. Pt alert and oriented x 4. In no distress at this time. Daughter in law , [name] at bedside to help provide history. Pt oriented to room. Code word in place. Fall prevention plan reviewed with patient and family. Yellow arm band, yellow socks and bed alarm in place. [facility]. Awaiting MD orders.

## 2014-02-04 NOTE — H&P (Addendum)
PCP: Estanislado PandySASSER,PAUL W, MD [place]   Chief Complaint:  [facility]  HPI: [name] is a 79 y.o. female   has a past medical history of Paroxysmal atrial fibrillation; Hypertension; Stroke; Dyslipidemia; Peripheral neuropathy; Amputation, traumatic, toes; Cataract (2011); Ejection fraction; Warfarin anticoagulation; Vertigo; IDDM (insulin dependent diabetes mellitus); Complication of anesthesia; CHF (congestive heart failure); COPD (chronic obstructive pulmonary disease); GERD (gastroesophageal reflux disease); TIA (transient ischemic attack) (2008 ); Arthritis; Chronic renal insufficiency; Kidney stones; and Chronic kidney disease (CKD), stage IV (severe).   Presented with  Patient had her 1 and 2 toe amputated in September since then she conitnued to have non haling ulcer and was admitted to [facility] for 1 week for wound care. On 11/23 she was going to undergo aortogram to evaluate for PVD but developed shortness of breath and procedure was aborted. She was admitted to [facility] for CHF exacerbation  and cardiology have been consulted. She was diuresed and her Cr was monitored.  Patient was discharged back to [facility] for rehab and wound care and the plan was for her to get AKA while there but family [facility]. Vascular is aware and Dr. Edilia Boickson plans to operate in AM. Her coumadin has been held and she was given Vitamin K.  INR currently pending.  Patient has improved per family from her breathing stand point.   Hospitalist was called for admission for  Wound of the left foot with PVD with plan to perform AKA in AM  Review of Systems:    Pertinent positives include: Bilateral lower extremity swelling   Constitutional:  No weight loss, night sweats, Fevers, chills, fatigue, weight loss  HEENT:  No headaches, Difficulty swallowing,Tooth/dental problems,Sore throat,  No sneezing, itching, ear ache, nasal congestion, post nasal drip,    Cardio-vascular:  No chest pain, Orthopnea, PND, anasarca, dizziness, palpitations.no  GI:  No heartburn, indigestion, abdominal pain, nausea, vomiting, diarrhea, change in bowel habits, loss of appetite, melena, blood in stool, hematemesis Resp:  no shortness of breath at rest. No dyspnea on exertion, No excess mucus, no productive cough, No non-productive cough, No coughing up of blood.No change in color of mucus.No wheezing. Skin:  no rash or lesions. No jaundice GU:  no dysuria, change in color of urine, no urgency or frequency. No straining to urinate.  No flank pain.  Musculoskeletal:  No joint pain or no joint swelling. No decreased range of motion. No back pain.  Psych:  No change in mood or affect. No depression or anxiety. No memory loss.  Neuro: no localizing neurological complaints, no tingling, no weakness, no double vision, no gait abnormality, no slurred speech, no confusion  Otherwise ROS are negative except for above, 10 systems were reviewed  Past Medical History: Past Medical History  Diagnosis Date  . Paroxysmal atrial fibrillation     CHADS2 (4). Coumadin Rx  . Hypertension   . Stroke     dysarthria, resolved 2009  . Dyslipidemia   . Peripheral neuropathy   . Amputation, traumatic, toes     two toes right foot  . Cataract 2011  . Ejection fraction     EF 60-65% 09/2007, echo  . Warfarin anticoagulation     Atrial fib  . Vertigo     Vertigo with a fall 03/2012  . IDDM (insulin dependent diabetes mellitus)   . Complication of anesthesia     "11/2013 "got Ativan & Morphine; got wild"  . CHF (  congestive heart failure)   . COPD (chronic obstructive pulmonary disease)   . GERD (gastroesophageal reflux disease)   . TIA (transient ischemic attack) 2008   . Arthritis     "fingers, toes, feet, legs, all over I reckon"  . Chronic renal insufficiency   . Kidney stones     "passed them"  . Chronic kidney disease (CKD), stage IV (severe)    Past Surgical  History  Procedure Laterality Date  . Appendectomy    . Cholecystectomy    . Toe amputation Right 2009    "big toe; little toe  . Tonsillectomy    . Toe amputation  11/2013    1st and 2nd digits  . Cataract extraction Right      Medications: Prior to Admission medications   Medication Sig Start Date End Date Taking? Authorizing Provider  acetaminophen (TYLENOL) 325 MG tablet Take 650 mg by mouth every 6 (six) hours as needed for mild pain.    Historical Provider, MD  ALPRAZolam Prudy Feeler) 0.25 MG tablet Take 0.25 mg by mouth 3 (three) times daily as needed for anxiety.     Historical Provider, MD  amitriptyline (ELAVIL) 50 MG tablet Take 50 mg by mouth at bedtime.    Historical Provider, MD  carvedilol (COREG) 6.25 MG tablet Take 1 tablet (6.25 mg total) by mouth 2 (two) times daily with a meal. 01/30/14   Huey Bienenstock, MD  Cholecalciferol 2000 UNITS TABS Take 2,000 Units by mouth daily.    Historical Provider, MD  collagenase (SANTYL) ointment Apply 1 application topically daily. Apply to left lower extremity    Historical Provider, MD  cyanocobalamin 500 MCG tablet Take 1 tablet (500 mcg total) by mouth daily. 01/30/14   Dawood Elgergawy, MD  darbepoetin (ARANESP) 60 MCG/0.3ML SOLN injection Inject 60 mcg into the skin every 7 (seven) days.    Historical Provider, MD  furosemide (LASIX) 40 MG tablet Take 40 mg by mouth 2 (two) times daily.     Historical Provider, MD  guaiFENesin-dextromethorphan (ROBITUSSIN DM) 100-10 MG/5ML syrup Take 15 mLs by mouth every 4 (four) hours as needed for cough. 01/30/14   Huey Bienenstock, MD  hydrALAZINE (APRESOLINE) 50 MG tablet Take 1 tablet (50 mg total) by mouth 3 (three) times daily. 01/30/14   Huey Bienenstock, MD  HYDROcodone-acetaminophen (NORCO/VICODIN) 5-325 MG per tablet Take 1 tablet by mouth every 8 (eight) hours as needed (for pain).    Historical Provider, MD  hydrocortisone (ANUSOL-HC) 2.5 % rectal cream Place 1 application rectally  every 6 (six) hours as needed for hemorrhoids.    Historical Provider, MD  insulin aspart (NOVOLOG) 100 UNIT/ML injection Inject 0-15 Units into the skin 3 (three) times daily with meals. 01/30/14   Dawood Elgergawy, MD  ipratropium-albuterol (DUONEB) 0.5-2.5 (3) MG/3ML SOLN Take 3 mLs by nebulization every 6 (six) hours.     Historical Provider, MD  ipratropium-albuterol (DUONEB) 0.5-2.5 (3) MG/3ML SOLN Take 3 mLs by nebulization every 6 (six) hours as needed. 01/30/14   Huey Bienenstock, MD  iron polysaccharides (NIFEREX) 150 MG capsule Take 150 mg by mouth 2 (two) times daily with a meal.     Historical Provider, MD  isosorbide dinitrate (ISORDIL) 40 MG tablet Take 1 tablet (40 mg total) by mouth 3 (three) times daily. 01/30/14   Dawood Elgergawy, MD  ondansetron (ZOFRAN) 4 MG/2ML SOLN injection Inject 2 mLs (4 mg total) into the vein every 6 (six) hours as needed for nausea or vomiting. 01/30/14   Dawood  Elgergawy, MD  pantoprazole (PROTONIX) 40 MG tablet Take 1 tablet (40 mg total) by mouth daily. 01/30/14   Huey Bienenstock, MD  vitamin C (ASCORBIC ACID) 500 MG tablet Take 500 mg by mouth daily.    Historical Provider, MD  warfarin (COUMADIN) 4 MG tablet Take 1 tablet (4 mg total) by mouth daily. Please hold warfarin for tonight 11/27, will recheck INR level on 11/28, and resume warfarin as per pharmacy/physician recommendation. 01/31/14   Huey Bienenstock, MD  zinc sulfate 220 MG capsule Take 220 mg by mouth daily.    Historical Provider, MD    Allergies:   Allergies  Allergen Reactions  . Ativan [Lorazepam] Other (See Comments)    Unknown  . Morphine And Related Other (See Comments)    MAR  . Xanax [place] [facility]]     Oversedation     Social History:  Ambulatory  wheelchair bound  From facility [facility]    reports that she has never smoked. She has never used smokeless tobacco. She reports that she does not drink alcohol or use illicit drugs.    Family History: family  history includes Coronary artery disease in an other family member; Diabetes in her father; Heart failure in her mother; Hypertension in her son; Peripheral vascular disease in her father.    Physical Exam: Patient Vitals for the past 24 hrs:  BP Temp Temp src Pulse Resp SpO2  03/05/2014 2041 (!) 193/75 mmHg 98.1 F (36.7 C) Oral 88 20 99 %    1. General:  in No Acute distress 2. Psychological: Alert and Oriented 3. Head/ENT:   Moist  Mucous Membranes                          Head Non traumatic, neck supple                          Normal  Dentition 4. SKIN: normal  Skin turgor,  Skin clean Dry and intact no rash, wound of right foot noted with some grey discharge 5. Heart: Regular rate and rhythm no Murmur, Rub or gallop 6. Lungs: Clear to auscultation bilaterally, no wheezes or crackles   7. Abdomen: Soft, non-tender, Non distended 8. Lower extremities: no clubbing, cyanosis, trace edema 9. Neurologically Grossly intact, moving all 4 extremities equally 10. MSK: Normal range of motion  body mass index is unknown because there is no weight on file.   Labs on Admission:   No results found for this or any previous visit (from the past 24 hour(s)).  UA not obtained  No results found for: HGBA1C  Estimated Creatinine Clearance: 22.8 mL/min (by C-G formula based on Cr of 2.2).  BNP (last 3 results) No results for input(s): PROBNP in the last 8760 hours.  Other results:  I have pearsonaly reviewed this: ECG REPORT Will obtain   There were no vitals filed for this visit.   Cultures: No results found for: SDES, SPECREQUEST, CULT, REPTSTATUS   Radiological Exams on Admission: No results found.  Chart has been reviewed  Assessment/Plan 79 yo F with hx of CHF, a.fib, PVD, DM here with non healing LEFT foot ulcer being admitted for AKA in AM by Dr. Edilia Bo  Present on Admission:  . Chronic atrial fibrillation - holding coumadin, will check INR and treat as need .  Essential hypertension  - restart chronic meds . Chronic diastolic CHF (congestive heart failure) continue lasix,  watch for fluid overload . PAD (peripheral artery disease) - due to PVD and non-healing AKA is planned in AM . CKD (chronic kidney disease) stage 3, GFR 30-59 ml/min - monitor Cr . Foot ulcer  - As per vascular, will obtain perioperative CXR and ECG, type and screen, NPO post midnight, follow INR Diabetes -  SSI, continue to hold lantus   Prophylaxis: SCD , Protonix  CODE STATUS:  FULL CODE    Other plan as per orders.  I have spent a total of 55 min on this admission extra time was taken to discuss care with Dr. Beryle LatheEarly  Ailin Rochford 02/20/2014, 10:05 PM  [facility]  Pager [phone]   after 2 AM please page floor coverage PA If 7AM-7PM, please contact the day team taking care of the patient  [email or web address]  Password [email or web address]

## 2014-02-05 ENCOUNTER — Encounter (HOSPITAL_COMMUNITY): Admission: AD | Disposition: E | Payer: Self-pay | Source: Other Acute Inpatient Hospital | Attending: Internal Medicine

## 2014-02-05 ENCOUNTER — Inpatient Hospital Stay (HOSPITAL_COMMUNITY): Admission: RE | Admit: 2014-02-05 | Payer: Medicare Other | Source: Ambulatory Visit | Admitting: Vascular Surgery

## 2014-02-05 ENCOUNTER — Encounter (HOSPITAL_COMMUNITY): Payer: Self-pay | Admitting: Certified Registered Nurse Anesthetist

## 2014-02-05 DIAGNOSIS — I5032 Chronic diastolic (congestive) heart failure: Secondary | ICD-10-CM

## 2014-02-05 DIAGNOSIS — I480 Paroxysmal atrial fibrillation: Secondary | ICD-10-CM

## 2014-02-05 DIAGNOSIS — N183 Chronic kidney disease, stage 3 (moderate): Secondary | ICD-10-CM

## 2014-02-05 DIAGNOSIS — G9340 Encephalopathy, unspecified: Secondary | ICD-10-CM

## 2014-02-05 DIAGNOSIS — Z01810 Encounter for preprocedural cardiovascular examination: Secondary | ICD-10-CM

## 2014-02-05 DIAGNOSIS — Z7901 Long term (current) use of anticoagulants: Secondary | ICD-10-CM

## 2014-02-05 LAB — COMPREHENSIVE METABOLIC PANEL
ALBUMIN: 2.7 g/dL — AB (ref 3.5–5.2)
ALK PHOS: 76 U/L (ref 39–117)
ALT: 11 U/L (ref 0–35)
ALT: 10 U/L (ref 0–35)
AST: 18 U/L (ref 0–37)
AST: 15 U/L (ref 0–37)
Albumin: 2.9 g/dL — ABNORMAL LOW (ref 3.5–5.2)
Alkaline Phosphatase: 79 U/L (ref 39–117)
Anion gap: 14 (ref 5–15)
Anion gap: 14 (ref 5–15)
BILIRUBIN TOTAL: 0.3 mg/dL (ref 0.3–1.2)
BUN: 40 mg/dL — ABNORMAL HIGH (ref 6–23)
BUN: 40 mg/dL — ABNORMAL HIGH (ref 6–23)
CALCIUM: 9.2 mg/dL (ref 8.4–10.5)
CHLORIDE: 97 meq/L (ref 96–112)
CO2: 32 mEq/L (ref 19–32)
CO2: 32 meq/L (ref 19–32)
Calcium: 9.3 mg/dL (ref 8.4–10.5)
Chloride: 95 mEq/L — ABNORMAL LOW (ref 96–112)
Creatinine, Ser: 1.91 mg/dL — ABNORMAL HIGH (ref 0.50–1.10)
Creatinine, Ser: 1.88 mg/dL — ABNORMAL HIGH (ref 0.50–1.10)
GFR calc Af Amer: 29 mL/min — ABNORMAL LOW (ref >90)
GFR calc non Af Amer: 24 mL/min — ABNORMAL LOW (ref >90)
GFR, EST AFRICAN AMERICAN: 28 mL/min — AB (ref >90)
GFR, EST NON AFRICAN AMERICAN: 25 mL/min — AB (ref >90)
GLUCOSE: 181 mg/dL — AB (ref 70–99)
Glucose, Bld: 89 mg/dL (ref 70–99)
POTASSIUM: 4.3 meq/L (ref 3.7–5.3)
Potassium: 3.9 mEq/L (ref 3.7–5.3)
SODIUM: 141 meq/L (ref 137–147)
SODIUM: 143 meq/L (ref 137–147)
TOTAL PROTEIN: 7.2 g/dL (ref 6.0–8.3)
Total Bilirubin: 0.3 mg/dL (ref 0.3–1.2)
Total Protein: 7.2 g/dL (ref 6.0–8.3)

## 2014-02-05 LAB — CBC
HCT: 29.9 % — ABNORMAL LOW (ref 36.0–46.0)
Hemoglobin: 9.2 g/dL — ABNORMAL LOW (ref 12.0–15.0)
MCH: 27.8 pg (ref 26.0–34.0)
MCHC: 30.8 g/dL (ref 30.0–36.0)
MCV: 90.3 fL (ref 78.0–100.0)
PLATELETS: 192 10*3/uL (ref 150–400)
RBC: 3.31 MIL/uL — ABNORMAL LOW (ref 3.87–5.11)
RDW: 16.2 % — AB (ref 11.5–15.5)
WBC: 6.2 10*3/uL (ref 4.0–10.5)

## 2014-02-05 LAB — BLOOD GAS, ARTERIAL
Acid-Base Excess: 7.7 mmol/L — ABNORMAL HIGH (ref 0.0–2.0)
BICARBONATE: 34 meq/L — AB (ref 20.0–24.0)
Drawn by: 313941
O2 Content: 2 L/min
O2 Saturation: 94.5 %
PATIENT TEMPERATURE: 97.9
PH ART: 7.307 — AB (ref 7.350–7.450)
TCO2: 36.2 mmol/L (ref 0–100)
pCO2 arterial: 69.7 mmHg (ref 35.0–45.0)
pO2, Arterial: 71.2 mmHg — ABNORMAL LOW (ref 80.0–100.0)

## 2014-02-05 LAB — ABO/RH: ABO/RH(D): O NEG

## 2014-02-05 LAB — URINE MICROSCOPIC-ADD ON

## 2014-02-05 LAB — TYPE AND SCREEN
ABO/RH(D): O NEG
ANTIBODY SCREEN: NEGATIVE

## 2014-02-05 LAB — URINALYSIS, ROUTINE W REFLEX MICROSCOPIC
Bilirubin Urine: NEGATIVE
Glucose, UA: NEGATIVE mg/dL
Hgb urine dipstick: NEGATIVE
Ketones, ur: 15 mg/dL — AB
LEUKOCYTES UA: NEGATIVE
Nitrite: NEGATIVE
PROTEIN: 100 mg/dL — AB
SPECIFIC GRAVITY, URINE: 1.013 (ref 1.005–1.030)
UROBILINOGEN UA: 0.2 mg/dL (ref 0.0–1.0)
pH: 5.5 (ref 5.0–8.0)

## 2014-02-05 LAB — GLUCOSE, CAPILLARY
GLUCOSE-CAPILLARY: 124 mg/dL — AB (ref 70–99)
Glucose-Capillary: 116 mg/dL — ABNORMAL HIGH (ref 70–99)
Glucose-Capillary: 123 mg/dL — ABNORMAL HIGH (ref 70–99)
Glucose-Capillary: 129 mg/dL — ABNORMAL HIGH (ref 70–99)
Glucose-Capillary: 174 mg/dL — ABNORMAL HIGH (ref 70–99)
Glucose-Capillary: 177 mg/dL — ABNORMAL HIGH (ref 70–99)
Glucose-Capillary: 96 mg/dL (ref 70–99)

## 2014-02-05 LAB — SURGICAL PCR SCREEN
MRSA, PCR: NEGATIVE
STAPHYLOCOCCUS AUREUS: NEGATIVE

## 2014-02-05 LAB — TSH: TSH: 1.96 u[IU]/mL (ref 0.350–4.500)

## 2014-02-05 LAB — MAGNESIUM: Magnesium: 2 mg/dL (ref 1.5–2.5)

## 2014-02-05 LAB — HEMOGLOBIN A1C
HEMOGLOBIN A1C: 6.9 % — AB (ref <5.7)
Mean Plasma Glucose: 151 mg/dL — ABNORMAL HIGH (ref <117)

## 2014-02-05 LAB — PHOSPHORUS: Phosphorus: 4 mg/dL (ref 2.3–4.6)

## 2014-02-05 LAB — PROTIME-INR
INR: 1.96 — AB (ref 0.00–1.49)
PROTHROMBIN TIME: 22.5 s — AB (ref 11.6–15.2)

## 2014-02-05 SURGERY — AMPUTATION, ABOVE KNEE
Anesthesia: General

## 2014-02-05 MED ORDER — CETYLPYRIDINIUM CHLORIDE 0.05 % MT LIQD
7.0000 mL | Freq: Two times a day (BID) | OROMUCOSAL | Status: DC
  Administered 2014-02-05 – 2014-02-15 (×19): 7 mL via OROMUCOSAL

## 2014-02-05 MED ORDER — CEFEPIME HCL 2 G IJ SOLR
2.0000 g | INTRAMUSCULAR | Status: DC
  Administered 2014-02-05 – 2014-02-06 (×2): 2 g via INTRAVENOUS
  Filled 2014-02-05 (×3): qty 2

## 2014-02-05 MED ORDER — FUROSEMIDE 10 MG/ML IJ SOLN
20.0000 mg | Freq: Once | INTRAMUSCULAR | Status: AC
  Administered 2014-02-05: 20 mg via INTRAVENOUS
  Filled 2014-02-05: qty 2

## 2014-02-05 MED ORDER — ENOXAPARIN SODIUM 80 MG/0.8ML ~~LOC~~ SOLN
80.0000 mg | SUBCUTANEOUS | Status: DC
  Administered 2014-02-05 – 2014-02-07 (×3): 80 mg via SUBCUTANEOUS
  Filled 2014-02-05 (×5): qty 0.8

## 2014-02-05 MED ORDER — HYDRALAZINE HCL 20 MG/ML IJ SOLN
5.0000 mg | Freq: Once | INTRAMUSCULAR | Status: AC
  Administered 2014-02-05: 5 mg via INTRAVENOUS
  Filled 2014-02-05: qty 1

## 2014-02-05 MED ORDER — GLUCERNA SHAKE PO LIQD
237.0000 mL | Freq: Two times a day (BID) | ORAL | Status: DC
  Administered 2014-02-05 – 2014-02-12 (×8): 237 mL via ORAL

## 2014-02-05 MED ORDER — VANCOMYCIN HCL IN DEXTROSE 1-5 GM/200ML-% IV SOLN
1000.0000 mg | INTRAVENOUS | Status: DC
  Administered 2014-02-05 – 2014-02-07 (×3): 1000 mg via INTRAVENOUS
  Filled 2014-02-05 (×4): qty 200

## 2014-02-05 SURGICAL SUPPLY — 48 items
BANDAGE ELASTIC 4 VELCRO ST LF (GAUZE/BANDAGES/DRESSINGS) ×4 IMPLANT
BANDAGE ELASTIC 6 VELCRO ST LF (GAUZE/BANDAGES/DRESSINGS) ×4 IMPLANT
BANDAGE ESMARK 6X9 LF (GAUZE/BANDAGES/DRESSINGS) IMPLANT
BLADE SAW RECIP 87.9 MT (BLADE) ×4 IMPLANT
BNDG CMPR 9X6 STRL LF SNTH (GAUZE/BANDAGES/DRESSINGS)
BNDG COHESIVE 6X5 TAN STRL LF (GAUZE/BANDAGES/DRESSINGS) ×4 IMPLANT
BNDG ESMARK 6X9 LF (GAUZE/BANDAGES/DRESSINGS)
BNDG GAUZE ELAST 4 BULKY (GAUZE/BANDAGES/DRESSINGS) ×4 IMPLANT
CANISTER SUCTION 2500CC (MISCELLANEOUS) ×4 IMPLANT
CLIP TI MEDIUM 6 (CLIP) IMPLANT
COVER SURGICAL LIGHT HANDLE (MISCELLANEOUS) ×4 IMPLANT
CUFF TOURNIQUET SINGLE 18IN (TOURNIQUET CUFF) IMPLANT
CUFF TOURNIQUET SINGLE 24IN (TOURNIQUET CUFF) IMPLANT
CUFF TOURNIQUET SINGLE 34IN LL (TOURNIQUET CUFF) IMPLANT
CUFF TOURNIQUET SINGLE 44IN (TOURNIQUET CUFF) IMPLANT
DRAIN CHANNEL 19F RND (DRAIN) IMPLANT
DRAPE ORTHO SPLIT 77X108 STRL (DRAPES) ×6
DRAPE PROXIMA HALF (DRAPES) ×4 IMPLANT
DRAPE SURG ORHT 6 SPLT 77X108 (DRAPES) ×4 IMPLANT
DRAPE U-SHAPE 47X51 STRL (DRAPES) ×4 IMPLANT
DRSG ADAPTIC 3X8 NADH LF (GAUZE/BANDAGES/DRESSINGS) ×4 IMPLANT
ELECT REM PT RETURN 9FT ADLT (ELECTROSURGICAL) ×3
ELECTRODE REM PT RTRN 9FT ADLT (ELECTROSURGICAL) ×2 IMPLANT
EVACUATOR SILICONE 100CC (DRAIN) IMPLANT
GAUZE SPONGE 4X4 12PLY STRL (GAUZE/BANDAGES/DRESSINGS) ×4 IMPLANT
GLOVE BIO SURGEON STRL SZ7.5 (GLOVE) ×4 IMPLANT
GLOVE BIOGEL PI IND STRL 8 (GLOVE) ×2 IMPLANT
GLOVE BIOGEL PI INDICATOR 8 (GLOVE) ×2
GOWN STRL REUS W/ TWL LRG LVL3 (GOWN DISPOSABLE) ×6 IMPLANT
GOWN STRL REUS W/TWL LRG LVL3 (GOWN DISPOSABLE) ×9
KIT BASIN OR (CUSTOM PROCEDURE TRAY) ×4 IMPLANT
KIT ROOM TURNOVER OR (KITS) ×4 IMPLANT
NS IRRIG 1000ML POUR BTL (IV SOLUTION) ×4 IMPLANT
PACK GENERAL/GYN (CUSTOM PROCEDURE TRAY) ×4 IMPLANT
PAD ARMBOARD 7.5X6 YLW CONV (MISCELLANEOUS) ×8 IMPLANT
PADDING CAST COTTON 6X4 STRL (CAST SUPPLIES) IMPLANT
STAPLER VISISTAT (STAPLE) ×4 IMPLANT
STOCKINETTE IMPERVIOUS LG (DRAPES) ×4 IMPLANT
SUT ETHILON 3 0 PS 1 (SUTURE) IMPLANT
SUT SILK 0 TIES 10X30 (SUTURE) ×4 IMPLANT
SUT SILK 2 0 (SUTURE) ×3
SUT SILK 2 0 SH CR/8 (SUTURE) ×4 IMPLANT
SUT SILK 2-0 18XBRD TIE 12 (SUTURE) ×2 IMPLANT
SUT SILK 3 0 (SUTURE) ×3
SUT SILK 3-0 18XBRD TIE 12 (SUTURE) ×2 IMPLANT
SUT VIC AB 2-0 CT1 18 (SUTURE) ×4 IMPLANT
UNDERPAD 30X30 INCONTINENT (UNDERPADS AND DIAPERS) ×4 IMPLANT
WATER STERILE IRR 1000ML POUR (IV SOLUTION) ×4 IMPLANT

## 2014-02-05 NOTE — Progress Notes (Signed)
Transfer pt to 2C9 at this time

## 2014-02-05 NOTE — Progress Notes (Signed)
PROGRESS NOTE  [name] NWG:[id number] DOB: 1934-10-22 DOA: 02/26/2014 PCP: Estanislado Pandy, MD  HPI Patient is a 79 yo female with a PMH of afib, (on warfarin), CVA, PVD, CHF, CKD IV, and pulmonary hypertension presented to the ER from [facility] for AKA due to non healing lower extremity wound.  Subjective:   - difficult to arouse, lethargic but wakes up when prompted, denies complaints then falls back asleep  Assessment/Plan:  Acute encephalopathy Uncertain etiology.  Possible UTI as patient has had foley cath.  Will check U/A CXR shows pleural effusions and pulmonary edema without infection. Will check ammonia level. Check ABG.   Patient has received no benzodiazepines and very little narcotics. Surgery was cancelled due to encephalopathy. Will start empiric vanc/cefepime 12/3.  PVD with non healing wound For AKA when stable.  Vascular will re-evaluate and consider amputation next Tuesday. Appreciate Cardiology Consultation for surgical clearance.  Patient is moderate to high risk.  Afib Holding coumadin. Vitamin K given to reverse warfarin prior to surgery. Will ask pharmacy to dose lovenox as surgery may be delayed until Tuesday.  Chronic Kidney disease stage IV Baseline creatinine is approximate 2.2.  Creatinine is currently lower than baseline.  Bilateral Pleural Effusions with chronic mild pulmonary edema. IV lasix x 1 today   Acute on chronic combined systolic and diastolic heart failure. 2D echo results below. Lasix as needed. Coreg, isordil.  HTN Well controlled on HTN, Coreg, isordil.  DM II  CBGs well controlled on current insulin regimen. A1C is 6.9.  Palliative Medicine Given patient's co-morbidities and her current status of "Full Code" I discussed a Palliative Medicine consult with Frederik Schmidt (dtr in law) and son Harvie Heck.  They are receptive. Palliative consulted for goals of care.   DVT Prophylaxis:  lovenox.  Code Status: Currently full code.   Requesting palliative consultation. Family Communication: Frederik Schmidt and Harvie Heck at bedside. Disposition Plan: Inpatient.   Consultants:  Vascular  Cardiology  Procedures:  2D Echo The estimated ejection fraction was in the range of 45% to 50%. There is akinesis of the mid-apicalanteroseptal myocardium. Features are consistent with a pseudonormal left ventricular filling pattern, with concomitant abnormal relaxation and increased filling pressure (grade 2 diastolic dysfunction).  Antibiotics: Anti-infectives    None      Will start cefepime and Vanc. 12/3.    Objective: Filed Vitals:   02/21/2014 0639 02-21-2014 0748 21-Feb-2014 0935 02-21-14 1435  BP: 184/75 162/60 148/66 145/64  Pulse: 92  92 82  Temp:   97.5 F (36.4 C) 96.9 F (36.1 C)  TempSrc:    Axillary  Resp:   16 20  Height:      Weight:      SpO2:   100% 97%    Intake/Output Summary (Last 24 hours) at 02-21-14 1539 Last data filed at 2014-02-21 0300  Gross per 24 hour  Intake      3 ml  Output      0 ml  Net      3 ml   Filed Weights   21-Feb-2014 0342  Weight: 81.2 kg (179 lb 0.2 oz)   Exam: General: Well developed, slumped over in recliner, unable to speak to me.  Dtr at bedside. HEENT:   MMM.  Neck: Supple, no JVD, no masses  Cardiovascular: RRR, S1 S2 auscultated, no rubs, murmurs or gallops.   Respiratory: Clear to auscultation bilaterally with equal chest rise  Abdomen: Soft, nontender, slightly distended, + bowel sounds  Extremities: 2-3+ edema, warm.  LLE ulceration wrapped in clean gauze. Neuro: lethargic, unable to follow commands or hold up her head.  Data Reviewed: Basic Metabolic Panel:  Recent Labs Lab 01/30/14 0455 03/30/2013 2325 02/27/2014 0515  NA 141 141 143  K 4.2 4.3 3.9  CL 99 95* 97  CO2 29 32 32  GLUCOSE 153* 181* 89  BUN 49* 40* 40*  CREATININE 2.20* 1.91* 1.88*  CALCIUM 8.8 9.2 9.3  MG  --   --  2.0  PHOS  --   --  4.0   Liver Function Tests:  Recent  Labs Lab 03/30/2013 2325 02/06/2014 0515  AST 18 15  ALT 11 10  ALKPHOS 76 79  BILITOT 0.3 0.3  PROT 7.2 7.2  ALBUMIN 2.7* 2.9*   CBC:  Recent Labs Lab 01/30/14 0455 03/30/2013 2325 02/12/2014 0515  WBC 8.7 6.2 6.2  NEUTROABS  --  4.3  --   HGB 8.3* 8.8* 9.2*  HCT 27.4* 28.7* 29.9*  MCV 91.0 88.3 90.3  PLT 163 207 192  CBG:  Recent Labs Lab 02/09/2014 0021 02/14/2014 0350 02/20/2014 0625 02/22/2014 0802 02/06/2014 1206  GLUCAP 174* 96 116* 129* 124*   Recent Results (from the past 240 hour(s))  Surgical pcr screen     Status: None   Collection Time: 02/27/2014 12:17 AM  Result Value Ref Range Status   MRSA, PCR NEGATIVE NEGATIVE Final   Staphylococcus aureus NEGATIVE NEGATIVE Final    Comment:        The Xpert SA Assay (FDA approved for NASAL specimens in patients over 77 years of age), is one component of a comprehensive surveillance program.  Test performance has been validated by [facility] for patients greater than or equal to 77 year old. It is not intended to diagnose infection nor to guide or monitor treatment.    Studies: Dg Chest 2 View  02/11/2014   CLINICAL DATA:  Preoperative chest radiograph for amputation above the knee. Cough and altered mental status. Initial encounter.  EXAM: CHEST  2 VIEW  COMPARISON:  Chest radiograph performed 01/27/2014  FINDINGS: The lungs are well-aerated. Moderate right and small left pleural effusions are seen, with bibasilar airspace opacification, similar in appearance to the prior study, concerning for persistent mild pulmonary edema. Underlying vascular congestion is seen. No pneumothorax is identified.  The heart is enlarged.  No acute osseous abnormalities are seen.  IMPRESSION: Moderate and small left pleural effusions, with bibasilar airspace opacification, similar in appearance to the prior study and concerning for persistent mild pulmonary edema. Cardiomegaly noted.   Electronically Signed   By: Roanna RaiderJeffery  Chang M.D.   On:  02/12/2014 03:50   Scheduled Meds: . amitriptyline  50 mg Oral QHS  . antiseptic oral rinse  7 mL Mouth Rinse BID  . carvedilol  6.25 mg Oral BID WC  . docusate sodium  100 mg Oral BID  . feeding supplement (GLUCERNA SHAKE)  237 mL Oral BID BM  . furosemide  20 mg Intravenous Once  . furosemide  40 mg Oral BID  . hydrALAZINE  50 mg Oral 3 times per day  . insulin aspart  0-9 Units Subcutaneous 6 times per day  . iron polysaccharides  150 mg Oral BID WC  . isosorbide dinitrate  40 mg Oral TID WC  . pantoprazole  40 mg Oral Daily  . sodium chloride  3 mL Intravenous Q12H   Continuous Infusions:   Principal Problem:   Foot ulcer, left Active Problems:   Chronic atrial  fibrillation   Essential hypertension   Diabetes mellitus   Warfarin anticoagulation   Chronic diastolic CHF (congestive heart failure)   PAD (peripheral artery disease)   CKD (chronic kidney disease) stage 3, GFR 30-59 ml/min   Foot ulcer  Algis DownsMarianne York, PA-C  [facility] Pager [phone]. If 7PM-7AM, please contact night-coverage at [email or web address], password [email or web address] 02/27/2014, 3:39 PM  LOS: 1 day    Patient seen and examined, chart and data base reviewed.  I agree with the above assessment and plan and have edited the above note  For full details please see Mrs. Algis DownsMarianne York PA note.  Time spent: 35 minutes  Pamella Pertostin Gherghe, MD [facility] [phone]

## 2014-02-05 NOTE — Progress Notes (Signed)
Upon entering room pt sitting in recliner leaning forward with eyes closed. Opens  eyes when name called. Talked with Chales AbrahamsMary Ann PA and Dr. Elvera LennoxGherghe about pt baseline. New orders place and pt placed back in bed. I&O cath done with 600ml returned and resp in to do ABG at this time. Son and daughter in law at bedside.

## 2014-02-05 NOTE — Progress Notes (Signed)
Dr. Elvera LennoxGherghe notified of ABG  Results.

## 2014-02-05 NOTE — Consult Note (Signed)
Admit date: 02/11/2014 Referring Physician  Dr. Edilia Bo Primary Physician Estanislado Pandy, MD Primary Cardiologist  Dr. Myrtis Ser Reason for Consultation  preoperative risk stratification above-knee amputation  HPI: 79 year old female patient of Dr. Myrtis Ser with a history of atrial fibrillation, severe peripheral vascular disease, diabetes, diastolic heart failure here with left nonhealing foot ulcer admitted for above knee amputation by Dr. Edilia Bo of vascular surgery with chronic kidney disease stage III. On chronic anticoagulation for atrial fibrillation, holding Coumadin, vitamin K administered. Prior stroke in 2009.  Has had first and second toe amputation 9/15, aortogram on 11/23 but procedure was aborted secondary to shortness of breath. Breathing has improved from family perspective.  She was last seen by Dr. Myrtis Ser of cardiology on 01/26/14 in the peripheral vascular angiography suite secondary to orthopnea, inability to lay down for procedure. Minor rales were noted on exam. She was recently stable in the office setting previous to this. Ongoing diuretic was administered and recommended.  Echocardiogram 01/27/14 demonstrated ejection fraction in the 45% range with anteroseptal wall motion abnormality. Left pleural effusion was noted. Diastolic dysfunction noted. Moderate to severe pulmonary hypertension with estimated pressures of 65 mmHg noted. See below for details.    PMH:   Past Medical History  Diagnosis Date  . Paroxysmal atrial fibrillation     CHADS2 (4). Coumadin Rx  . Hypertension   . Stroke     dysarthria, resolved 2009  . Dyslipidemia   . Peripheral neuropathy   . Amputation, traumatic, toes     two toes right foot  . Cataract 2011  . Ejection fraction     EF 60-65% 09/2007, echo  . Warfarin anticoagulation     Atrial fib  . Vertigo     Vertigo with a fall 03/2012  . IDDM (insulin dependent diabetes mellitus)   . Complication of anesthesia     "11/2013 "got Ativan &  Morphine; got wild"  . CHF (congestive heart failure)   . COPD (chronic obstructive pulmonary disease)   . GERD (gastroesophageal reflux disease)   . TIA (transient ischemic attack) 2008   . Arthritis     "fingers, toes, feet, legs, all over I reckon"  . Chronic renal insufficiency   . Kidney stones     "passed them"  . Chronic kidney disease (CKD), stage IV (severe)     PSH:   Past Surgical History  Procedure Laterality Date  . Appendectomy    . Cholecystectomy    . Toe amputation Right 2009    "big toe; little toe  . Tonsillectomy    . Toe amputation  11/2013    1st and 2nd digits  . Cataract extraction Right    Allergies:  Ativan; Morphine and related; and Xanax xr Prior to Admit Meds:   Prior to Admission medications   Medication Sig Start Date End Date Taking? Authorizing Provider  acetaminophen (TYLENOL) 325 MG tablet Take 650 mg by mouth every 6 (six) hours as needed for mild pain.    Historical Provider, MD  ALPRAZolam Prudy Feeler) 0.25 MG tablet Take 0.25 mg by mouth 3 (three) times daily as needed for anxiety.     Historical Provider, MD  amitriptyline (ELAVIL) 50 MG tablet Take 50 mg by mouth at bedtime.    Historical Provider, MD  carvedilol (COREG) 6.25 MG tablet Take 1 tablet (6.25 mg total) by mouth 2 (two) times daily with a meal. 01/30/14   Huey Bienenstock, MD  Cholecalciferol 2000 UNITS TABS Take 2,000 Units by mouth  daily.    Historical Provider, MD  collagenase (SANTYL) ointment Apply 1 application topically daily. Apply to left lower extremity    Historical Provider, MD  cyanocobalamin 500 MCG tablet Take 1 tablet (500 mcg total) by mouth daily. 01/30/14   Dawood Elgergawy, MD  darbepoetin (ARANESP) 60 MCG/0.3ML SOLN injection Inject 60 mcg into the skin every 7 (seven) days.    Historical Provider, MD  furosemide (LASIX) 40 MG tablet Take 40 mg by mouth 2 (two) times daily.     Historical Provider, MD  guaiFENesin-dextromethorphan (ROBITUSSIN DM) 100-10 MG/5ML  syrup Take 15 mLs by mouth every 4 (four) hours as needed for cough. 01/30/14   Huey Bienenstockawood Elgergawy, MD  hydrALAZINE (APRESOLINE) 50 MG tablet Take 1 tablet (50 mg total) by mouth 3 (three) times daily. 01/30/14   Huey Bienenstockawood Elgergawy, MD  HYDROcodone-acetaminophen (NORCO/VICODIN) 5-325 MG per tablet Take 1 tablet by mouth every 8 (eight) hours as needed (for pain).    Historical Provider, MD  hydrocortisone (ANUSOL-HC) 2.5 % rectal cream Place 1 application rectally every 6 (six) hours as needed for hemorrhoids.    Historical Provider, MD  insulin aspart (NOVOLOG) 100 UNIT/ML injection Inject 0-15 Units into the skin 3 (three) times daily with meals. 01/30/14   Dawood Elgergawy, MD  ipratropium-albuterol (DUONEB) 0.5-2.5 (3) MG/3ML SOLN Take 3 mLs by nebulization every 6 (six) hours.     Historical Provider, MD  ipratropium-albuterol (DUONEB) 0.5-2.5 (3) MG/3ML SOLN Take 3 mLs by nebulization every 6 (six) hours as needed. 01/30/14   Huey Bienenstockawood Elgergawy, MD  iron polysaccharides (NIFEREX) 150 MG capsule Take 150 mg by mouth 2 (two) times daily with a meal.     Historical Provider, MD  isosorbide dinitrate (ISORDIL) 40 MG tablet Take 1 tablet (40 mg total) by mouth 3 (three) times daily. 01/30/14   Dawood Elgergawy, MD  ondansetron (ZOFRAN) 4 MG/2ML SOLN injection Inject 2 mLs (4 mg total) into the vein every 6 (six) hours as needed for nausea or vomiting. 01/30/14   Dawood Elgergawy, MD  pantoprazole (PROTONIX) 40 MG tablet Take 1 tablet (40 mg total) by mouth daily. 01/30/14   Huey Bienenstockawood Elgergawy, MD  vitamin C (ASCORBIC ACID) 500 MG tablet Take 500 mg by mouth daily.    Historical Provider, MD  warfarin (COUMADIN) 4 MG tablet Take 1 tablet (4 mg total) by mouth daily. Please hold warfarin for tonight 11/27, will recheck INR level on 11/28, and resume warfarin as per pharmacy/physician recommendation. 01/31/14   Huey Bienenstockawood Elgergawy, MD  zinc sulfate 220 MG capsule Take 220 mg by mouth daily.    Historical Provider,  MD   Fam HX:    Family History  Problem Relation Age of Onset  . Coronary artery disease      FAMILY H/O  . Heart failure Mother   . Peripheral vascular disease Father   . Diabetes Father   . Hypertension Son    Social HX:    History   Social History  . Marital Status: Divorced    Spouse Name: N/A    Number of Children: N/A  . Years of Education: N/A   Occupational History  . Not on file.   Social History Main Topics  . Smoking status: Never Smoker   . Smokeless tobacco: Never Used  . Alcohol Use: No  . Drug Use: No  . Sexual Activity: No   Other Topics Concern  . Not on file   Social History Narrative     ROS:  All  11 ROS were addressed and are negative except what is stated in the HPI   Physical Exam: Blood pressure 162/60, pulse 92, temperature 97.6 F (36.4 C), temperature source Axillary, resp. rate 18, height 5\' 6"  (1.676 m), weight 179 lb 0.2 oz (81.2 kg), SpO2 98 %.   General: Well developed, sleeping in chair, but arousable for a few seconds to answer basic questions with one-word yes or no responses, in no acute distress Head: Eyes PERRLA, No xanthomas.   Normal cephalic and atramatic  Lungs:   Clear bilaterally to auscultation and percussion with decreased breath sounds left base. Normal respiratory effort. No wheezes, no rales. Heart:   HRRR S1 S2 Pulses are 2+ & equal. Soft systolic left lower sternal border murmur, no rubs, gallops.  No carotid bruit. No JVD.  No abdominal bruits.  Abdomen: Bowel sounds are positive, abdomen soft and non-tender without masses. No hepatosplenomegaly. Msk:  Back normal. Normal strength and tone for age. Extremities:   Left foot wound noted, no palpable pulses Neuro: Sedate, sleeping but arousable for simple questioning non-focal, MAE x 4 GU: Deferred Rectal: Deferred Psych:  Difficult to assess      Labs: Lab Results  Component Value Date   WBC 6.2 02/12/2014   HGB 9.2* 03/04/2014   HCT 29.9* 02/10/2014    MCV 90.3 02/18/2014   PLT 192 02/12/2014     Recent Labs Lab 02/03/2014 0515  NA 143  K 3.9  CL 97  CO2 32  BUN 40*  CREATININE 1.88*  CALCIUM 9.3  PROT 7.2  BILITOT 0.3  ALKPHOS 79  ALT 10  AST 15  GLUCOSE 89   No results for input(s): CKTOTAL, CKMB, TROPONINI in the last 72 hours. No results found for: CHOL, HDL, LDLCALC, TRIG No results found for: DDIMER   Radiology:  Dg Chest 2 View  02/11/2014   CLINICAL DATA:  Preoperative chest radiograph for amputation above the knee. Cough and altered mental status. Initial encounter.  EXAM: CHEST  2 VIEW  COMPARISON:  Chest radiograph performed 01/27/2014  FINDINGS: The lungs are well-aerated. Moderate right and small left pleural effusions are seen, with bibasilar airspace opacification, similar in appearance to the prior study, concerning for persistent mild pulmonary edema. Underlying vascular congestion is seen. No pneumothorax is identified.  The heart is enlarged.  No acute osseous abnormalities are seen.  IMPRESSION: Moderate and small left pleural effusions, with bibasilar airspace opacification, similar in appearance to the prior study and concerning for persistent mild pulmonary edema. Cardiomegaly noted.   Electronically Signed   By: Roanna Raider M.D.   On: 02/17/2014 03:50   Personally viewed.  EKG:  02/14/2014-sinus rhythm, heart rate 86 bpm, poor R-wave progression, T-wave inversion in the lateral precordial leads, possible ischemia. Personally viewed.  Echocardiogram: 01/27/14  - Left ventricle: The cavity size was normal. There was moderate focal basal and mild concentric hypertrophy of the septum. Systolic function was mildly reduced. The estimated ejection fraction was in the range of 45% to 50%. There is akinesis of the mid-apicalanteroseptal myocardium. Features are consistent with a pseudonormal left ventricular filling pattern, with concomitant abnormal relaxation and increased filling pressure (grade  2 diastolic dysfunction). - Mitral valve: Calcified annulus. There was mild regurgitation. - Left atrium: The atrium was mildly dilated. - Right ventricle: The cavity size was mildly dilated. Wall thickness was normal. - Right atrium: The atrium was mildly dilated. - Tricuspid valve: There was moderate regurgitation. - Pulmonary arteries: Systolic pressure was moderately  to severely increased. PA peak pressure: 65 mm Hg (S). - Pericardium, extracardiac: A trivial pericardial effusion was identified posterior to the heart. There was a moderate-sized left pleural effusion.   ASSESSMENT/PLAN:    79 year old female with severe peripheral vascular disease, nonhealing diabetic ulcer requiring above-knee amputation with abnormal EKG, echocardiogram showing mildly reduced ejection fraction of 45%, pulmonary hypertension 65 mmHg PA pressure with paroxysmal atrial fibrillation on chronic anticoagulation secondary to prior stroke.  1. Preoperative risk stratification - based upon her multiple cardiovascular risk factors, she is of at least moderate to high risk for cardiovascular complication during surgery. Recent aortogram was aborted secondary to orthopnea/shortness of breath and care must be taken to avoid fluid overload in the perioperative setting. Use Lasix as needed. I do not see a nuclear stress test in epic. She does not endorse exertional anginal symptoms (unreliable history) however it is unlikely that she is able to obtain greater than 4 METS of activity. Given the severity of her infection, ulceration and lack of alternative therapies, above-knee amputation is necessary and likely needs to be performed in a semiurgent fashion. Ultimately, she may proceed with surgery with moderate  to high risk for possible cardiovascular complication. No further cardiac testing at this point will ultimately change this opinion.   2. Paroxysmal atrial for ablation-currently in sinus rhythm. It would  not be unusual for her to demonstrate perioperative atrial fibrillation. Continue with carvedilol.  3. Chronic anticoagulation-Coumadin has been held, vitamin K has been administered per primary team. Resume when felt comfortable.  4. Chronic diastolic heart failure/likely secondary pulmonary hypertension-administer Lasix as needed. Currently appears fairly comfortable from a breathing perspective.   We will continue to follow.     Donato SchultzSKAINS, Joanna Hall, MD  03/03/2014  9:18 AM

## 2014-02-05 NOTE — Progress Notes (Signed)
Talked with Debbie(response team) about transfer to step down. I notified resp  of need to have pt placed on BiPap here on unit until transfer.

## 2014-02-05 NOTE — Progress Notes (Signed)
   VASCULAR SURGERY ASSESSMENT & PLAN:  * This [facility]. During her most recent hospitalization, she was not agreeable to proceed with amputation. However, reportedly while at [facility] she decided to proceed with amputation. The physician there spoke with Dr. Arbie CookeyEarly and arrangements were made for the [facility] to [facility]. Her Coumadin had been held and reportedly she had received 2 doses of vitamin K.  * I examine the patient this morning. She is lethargic and difficult to arouse. I examined her foot and I do not think that this is a source of sepsis. In addition her INR is still significantly elevated at 1.96. She also has significant cardiac disease with significant diastolic congestive heart failure. She has multiple other comorbidities including atrial fibrillation, diabetes, chronic kidney disease, and a history of a CVA.  * After extensive discussion with the family today, we have decided to cancel her surgery for today. Her INR is still significantly elevated, which puts her at increased risk for bleeding complications. In addition, she is difficult to arouse and certainly undergoing a general anesthetic could worsen that situation. She is also very high risk for surgery given her cardiac history and has been evaluated by cardiology. I would continue to hold her Coumadin with plans for left AKA early next week if her mental status improves and she is stable from a cardiac standpoint. I have reinforced to the family that she is severely ill and that she is at high-risk for amputation given her multiple medical issues. We have also discussed the potential option of palliative care. The family would like to proceed with left above-the-knee amputation next week  If she shows some improvement.    SUBJECTIVE: Difficult to arouse.   PHYSICAL EXAM: Filed Vitals:   02/20/2014 0526 02/12/2014 0639 02/03/2014 0748 03/04/2014 0935    BP: 183/75 184/75 162/60 148/66  Pulse: 87 92  92  Temp: 97.6 F (36.4 C)   97.5 F (36.4 C)  TempSrc:      Resp: 18   16  Height:      Weight:      SpO2: 98%   100%   I examined the left foot. The wound has not changed significantly. There is no proximal cellulitis. I not think that the left foot is currently a source of sepsis.   LABS: Lab Results  Component Value Date   WBC 6.2 02/08/2014   HGB 9.2* 02/18/2014   HCT 29.9* 02/04/2014   MCV 90.3 02/21/2014   PLT 192 02/22/2014   Lab Results  Component Value Date   CREATININE 1.88* 02/07/2014   Lab Results  Component Value Date   INR 1.96* 02/18/2014   CBG (last 3)   Recent Labs  02/14/2014 0625 02/03/2014 0802 02/13/2014 1206  GLUCAP 116* 129* 124*    Active Problems:   Chronic atrial fibrillation   Essential hypertension   Diabetes mellitus   Warfarin anticoagulation   Chronic diastolic CHF (congestive heart failure)   PAD (peripheral artery disease)   CKD (chronic kidney disease) stage 3, GFR 30-59 ml/min   Foot ulcer, left   Foot ulcer   Cari CarawayChris Rana Hochstein Beeper: [phone] 02/04/2014

## 2014-02-05 NOTE — Progress Notes (Signed)
INITIAL NUTRITION ASSESSMENT  DOCUMENTATION CODES Per approved criteria  -Not Applicable   INTERVENTION: Glucerna Shake po BID, each supplement provides 220 kcal and 10 grams of protein  NUTRITION DIAGNOSIS: Increased nutrient needs related to wound healing as evidenced by non-healing ulcer.   Goal: Pt to meet >/= 90% of their estimated nutrition needs   Monitor:  Weight trend, po intake, acceptance of supplements, labs  Reason for Assessment: MST  79 y.o. female  Admitting Dx: <principal problem not specified>  ASSESSMENT: 79 year old female. Patient had her 1 and 2 toe amputated in September since then she conitnued to have non haling ulcer and was admitted to [facility] for 1 week for wound care. On 11/23 she was going to undergo aortogram to evaluate for PVD but developed shortness of breath and procedure was aborted. [facility] and cardiology have been consulted. She was diuresed and her Cr was monitored. Patient was discharged back to [facility] for rehab and wound care and the plan was for her to get AKA while there but [facility].   - Spoke with RN and pt's family. Pt's AKA has been rescheduled for next week. Pt was asleep during RD visit. Family reports that she has had a good appetite. She was asking for food today. Diet upgraded to Carbohydrate Modified.  - Pt with no signs of fat or muscle wasting.   Height: Ht Readings from Last 1 Encounters:  02/09/2014 5\' 6"  (1.676 m)    Weight: Wt Readings from Last 1 Encounters:  02/09/2014 179 lb 0.2 oz (81.2 kg)    Ideal Body Weight: 59.3 kg  % Ideal Body Weight: 137%  Wt Readings from Last 10 Encounters:  03/02/2014 179 lb 0.2 oz (81.2 kg)  01/30/14 175 lb 14.8 oz (79.8 kg)  01/22/14 172 lb (78.019 kg)  01/06/14 165 lb (74.844 kg)  04/18/13 170 lb (77.111 kg)  04/08/12 169 lb (76.658 kg)  03/29/11 175 lb (79.379 kg)  02/07/10 175 lb (79.379 kg)  02/23/09  175 lb 4 oz (79.493 kg)    Usual Body Weight: 175-180 lbs  % Usual Body Weight: 100%  BMI:  Body mass index is 28.91 kg/(m^2).  Estimated Nutritional Needs: Kcal: 1750-1950 Protein: 115-125 g Fluid: 2.0 L/day  Skin: non-healing ulcer on foot, stage I pressure ulcer on sacrum  Diet Order: Diet Carb Modified  EDUCATION NEEDS: -No education needs identified at this time   Intake/Output Summary (Last 24 hours) at 02/03/2014 1333 Last data filed at 02/18/2014 0300  Gross per 24 hour  Intake      3 ml  Output      0 ml  Net      3 ml    Last BM: prior to admission   Labs:   Recent Labs Lab 01/30/14 0455 09-27-13 2325 02/03/2014 0515  NA 141 141 143  K 4.2 4.3 3.9  CL 99 95* 97  CO2 29 32 32  BUN 49* 40* 40*  CREATININE 2.20* 1.91* 1.88*  CALCIUM 8.8 9.2 9.3  MG  --   --  2.0  PHOS  --   --  4.0  GLUCOSE 153* 181* 89    CBG (last 3)   Recent Labs  02/21/2014 0625 02/25/2014 0802 02/11/2014 1206  GLUCAP 116* 129* 124*    Scheduled Meds: . amitriptyline  50 mg Oral QHS  . antiseptic oral rinse  7 mL Mouth Rinse BID  . carvedilol  6.25 mg Oral BID WC  . docusate sodium  100 mg Oral BID  . furosemide  40 mg Oral BID  . hydrALAZINE  50 mg Oral 3 times per day  . insulin aspart  0-9 Units Subcutaneous 6 times per day  . iron polysaccharides  150 mg Oral BID WC  . isosorbide dinitrate  40 mg Oral TID WC  . pantoprazole  40 mg Oral Daily  . sodium chloride  3 mL Intravenous Q12H    Continuous Infusions:   Past Medical History  Diagnosis Date  . Paroxysmal atrial fibrillation     CHADS2 (4). Coumadin Rx  . Hypertension   . Stroke     dysarthria, resolved 2009  . Dyslipidemia   . Peripheral neuropathy   . Amputation, traumatic, toes     two toes right foot  . Cataract 2011  . Ejection fraction     EF 60-65% 09/2007, echo  . Warfarin anticoagulation     Atrial fib  . Vertigo     Vertigo with a fall 03/2012  . IDDM (insulin dependent diabetes mellitus)    . Complication of anesthesia     "11/2013 "got Ativan & Morphine; got wild"  . CHF (congestive heart failure)   . COPD (chronic obstructive pulmonary disease)   . GERD (gastroesophageal reflux disease)   . TIA (transient ischemic attack) 2008   . Arthritis     "fingers, toes, feet, legs, all over I reckon"  . Chronic renal insufficiency   . Kidney stones     "passed them"  . Chronic kidney disease (CKD), stage IV (severe)     Past Surgical History  Procedure Laterality Date  . Appendectomy    . Cholecystectomy    . Toe amputation Right 2009    "big toe; little toe  . Tonsillectomy    . Toe amputation  11/2013    1st and 2nd digits  . Cataract extraction Right     Emmaline KluverHaley Hina Gupta MS, RD, LDN

## 2014-02-05 NOTE — Consult Note (Signed)
PHARMACY CONSULT NOTE  Pharmacy Consult :  Lovenox :  Cefepime + Vancomycin Indication : Atrial fibrillation :  Encephalopathy, possible UTI, possible HCAP  Allergies: Allergies  Allergen Reactions  . Ativan [Lorazepam] Other (See Comments)    Unknown  . Morphine And Related Other (See Comments)    MAR  . Xanax Xr [Alprazolam Er]     Oversedation    Dosing weight : 81 kg IBW: 59 kg  Vital Signs: BP 145/64 mmHg  Pulse 82  Temp(Src) 96.9 F (36.1 C) (Axillary)  Resp 20  Ht 5\' 6"  (1.676 m)  Wt 179 lb 0.2 oz (81.2 kg)  BMI 28.91 kg/m2  SpO2 97%  Active Problems: Principal Problem:   Foot ulcer, left Active Problems:   Chronic atrial fibrillation   Essential hypertension   Diabetes mellitus   Warfarin anticoagulation   Chronic diastolic CHF (congestive heart failure)   PAD (peripheral artery disease)   CKD (chronic kidney disease) stage 3, GFR 30-59 ml/min   Foot ulcer   Labs:  Recent Labs  02/21/2014 2325 03/26/2013 0515  WBC                  6.2 6.2  HGB 8.8* 9.2*  HCT 28.7* 29.9*  PLT 207 192  LABPROT 22.8* 22.5*  INR 1.99* 1.96*  CREATININE 1.91* 1.88*   Lab Results  Component Value Date   INR 1.96* 04-Mar-2014   INR 1.99* 03/02/2014   INR 3.35* 01/30/2014   Estimated Creatinine Clearance: 26.9 mL/min (by C-G formula based on Cr of 1.88).   Results for orders placed or performed during the hospital encounter of 03/03/2014  Surgical pcr screen     Status: None   Collection Time: 03/26/2013 12:17 AM  Result Value Ref Range Status   MRSA, PCR NEGATIVE NEGATIVE Final   Staphylococcus aureus NEGATIVE NEGATIVE Final    Comment:        The Xpert SA Assay (FDA approved for NASAL specimens in patients over 77 years of age), is one component of a comprehensive surveillance program.  Test performance has been validated by [facility] for patients greater than or equal to 663 year old. It is not intended to diagnose infection nor to guide or monitor  treatment.    Medical / Surgical History: Past Medical History  Diagnosis Date  . Paroxysmal atrial fibrillation     CHADS2 (4). Coumadin Rx  . Hypertension   . Stroke     dysarthria, resolved 2009  . Dyslipidemia   . Peripheral neuropathy   . Amputation, traumatic, toes     two toes right foot  . Cataract 2011  . Ejection fraction     EF 60-65% 09/2007, echo  . Warfarin anticoagulation     Atrial fib  . Vertigo     Vertigo with a fall 03/2012  . IDDM (insulin dependent diabetes mellitus)   . Complication of anesthesia     "11/2013 "got Ativan & Morphine; got wild"  . CHF (congestive heart failure)   . COPD (chronic obstructive pulmonary disease)   . GERD (gastroesophageal reflux disease)   . TIA (transient ischemic attack) 2008   . Arthritis     "fingers, toes, feet, legs, all over I reckon"  . Chronic renal insufficiency   . Kidney stones     "passed them"  . Chronic kidney disease (CKD), stage IV (severe)    Past Surgical History  Procedure Laterality Date  . Appendectomy    . Cholecystectomy    .  Toe amputation Right 2009    "big toe; little toe  . Tonsillectomy    . Toe amputation  11/2013    1st and 2nd digits  . Cataract extraction Right     Current Medication[s] Include: Medication PTA: Prescriptions prior to admission  Medication Sig Dispense Refill Last Dose  . acetaminophen (TYLENOL) 325 MG tablet Take 650 mg by mouth every 6 (six) hours as needed for mild pain.   Past Week at Unknown time  . ALPRAZolam (XANAX) 0.25 MG tablet Take 0.25 mg by mouth 3 (three) times daily as needed for anxiety.    01/15/2014 at Unknown time  . amitriptyline (ELAVIL) 50 MG tablet Take 50 mg by mouth at bedtime.   01/25/2014 at Unknown time  . carvedilol (COREG) 6.25 MG tablet Take 1 tablet (6.25 mg total) by mouth 2 (two) times daily with a meal.     . Cholecalciferol 2000 UNITS TABS Take 2,000 Units by mouth daily.   01/25/2014 at Unknown time  . collagenase (SANTYL)  ointment Apply 1 application topically daily. Apply to left lower extremity   01/25/2014 at Unknown time  . cyanocobalamin 500 MCG tablet Take 1 tablet (500 mcg total) by mouth daily.     . darbepoetin (ARANESP) 60 MCG/0.3ML SOLN injection Inject 60 mcg into the skin every 7 (seven) days.   01/24/2014 at Unknown time  . furosemide (LASIX) 40 MG tablet Take 40 mg by mouth 2 (two) times daily.    01/25/2014 at Unknown time  . guaiFENesin-dextromethorphan (ROBITUSSIN DM) 100-10 MG/5ML syrup Take 15 mLs by mouth every 4 (four) hours as needed for cough. 118 mL 0   . hydrALAZINE (APRESOLINE) 50 MG tablet Take 1 tablet (50 mg total) by mouth 3 (three) times daily.     Marland Kitchen HYDROcodone-acetaminophen (NORCO/VICODIN) 5-325 MG per tablet Take 1 tablet by mouth every 8 (eight) hours as needed (for pain).   01/22/2014 at Unknown time  . hydrocortisone (ANUSOL-HC) 2.5 % rectal cream Place 1 application rectally every 6 (six) hours as needed for hemorrhoids.   01/15/2014 at Unknown time  . insulin aspart (NOVOLOG) 100 UNIT/ML injection Inject 0-15 Units into the skin 3 (three) times daily with meals. 10 mL 11   . ipratropium-albuterol (DUONEB) 0.5-2.5 (3) MG/3ML SOLN Take 3 mLs by nebulization every 6 (six) hours.    01/26/2014 at Unknown time  . ipratropium-albuterol (DUONEB) 0.5-2.5 (3) MG/3ML SOLN Take 3 mLs by nebulization every 6 (six) hours as needed. 360 mL    . iron polysaccharides (NIFEREX) 150 MG capsule Take 150 mg by mouth 2 (two) times daily with a meal.    01/25/2014 at Unknown time  . isosorbide dinitrate (ISORDIL) 40 MG tablet Take 1 tablet (40 mg total) by mouth 3 (three) times daily.     . ondansetron (ZOFRAN) 4 MG/2ML SOLN injection Inject 2 mLs (4 mg total) into the vein every 6 (six) hours as needed for nausea or vomiting. 2 mL 0   . pantoprazole (PROTONIX) 40 MG tablet Take 1 tablet (40 mg total) by mouth daily.     . vitamin C (ASCORBIC ACID) 500 MG tablet Take 500 mg by mouth daily.    01/25/2014 at Unknown time  . warfarin (COUMADIN) 4 MG tablet Take 1 tablet (4 mg total) by mouth daily. Please hold warfarin for tonight 11/27, will recheck INR level on 11/28, and resume warfarin as per pharmacy/physician recommendation.     Marland Kitchen zinc sulfate 220 MG capsule Take 220 mg  by mouth daily.   01/25/2014 at Unknown time    Scheduled:  Scheduled:  . amitriptyline  50 mg Oral QHS  . antiseptic oral rinse  7 mL Mouth Rinse BID  . carvedilol  6.25 mg Oral BID WC  . docusate sodium  100 mg Oral BID  . feeding supplement (GLUCERNA SHAKE)  237 mL Oral BID BM  . furosemide  20 mg Intravenous Once  . furosemide  40 mg Oral BID  . hydrALAZINE  50 mg Oral 3 times per day  . insulin aspart  0-9 Units Subcutaneous 6 times per day  . iron polysaccharides  150 mg Oral BID WC  . isosorbide dinitrate  40 mg Oral TID WC  . pantoprazole  40 mg Oral Daily  . sodium chloride  3 mL Intravenous Q12H   Infusion[s]: Infusions:   Antibiotic[s]: Anti-infectives    None     Assessment:  79 y.o.female admitted with PMH of afib, (on warfarin), CVA, PVD, CHF, CKD IV, and pulmonary hypertension presented to the ER from [facility] for AKA due to gangrenous non healing lower extremity wound.  Patient has been on chronic Coumadin for atrial fibrillation.  Coumadin currently on hold for possible amputation next Tuesday.   Patient presents with encephalopathy of unknown etiology and possible addition of UTI and/or HCAP.  Pharmacy to dose Lovenox for bridging while Coumadin is on hold.  Cefepime + Vancomycin will be started [after cultures obtained]    Goal of Therapy:  Lovenox Anti-Xa level 0.6-1 units/ml 4hrs after LMWH dose given  Vancomycin troughs 15 - 20 mcg/ml Cefepime selected for broadening antibiotic coverage for presumed infection and adjusted for renal function.    Plan:  1. Lovenox 1mg /kg q 24 hours.  Lovenox 80 mg sq q 24 hours. 2. After cultures drawn, begin Cefepime 2 gm IV q 24 hours  followed by, 3. Vancomycin 1 gm IV q 24 hours. Monitor renal function, WBC, fever curve, any cultures/sensitivities, and clinical progression.   Drug levels as indicated.   Dino Borntreger, Elisha HeadlandEarle J,  Pharm.D.. 02/11/2014,  4:41 PM

## 2014-02-05 NOTE — Progress Notes (Signed)
Utilization review completed.  

## 2014-02-05 NOTE — Progress Notes (Signed)
Notified by RN Mardene Celeste that patient needed Bipap for hypercarbia - MD ordering SDU bed - spoke with PP - beds being cleaned - RN states patient has been very lethargic all day - instructed her to call RT and let them know to bring Bipap to start on floor.  Met Carrie RT with bipap on way to 5w.  On my arrival to 5w patient being transported - assisted with transport to 2c09 without incident.  Handoff to [employer].

## 2014-02-05 NOTE — Plan of Care (Signed)
Problem: Consults Goal: Diabetes Guidelines if Diabetic/Glucose > 140 If diabetic or lab glucose is > 140 mg/dl - Initiate Diabetes/Hyperglycemia Guidelines & Document Interventions  Outcome: Completed/Met Date Met:  02/24/2014  Problem: Phase I Progression Outcomes Goal: OOB as tolerated unless otherwise ordered Outcome: Progressing Patient is up with 1 assist to BSC.     

## 2014-02-05 NOTE — Progress Notes (Signed)
Chaplain responded to page for Advanced Directive.  Pt asleep, will check back later.    02/18/2014 1000  Clinical Encounter Type  Visited With Patient not available  Referral From Nurse  Stress Factors  Patient Stress Factors Exhausted   Erroll Lunavercash, Gera Inboden A, Chaplain

## 2014-02-05 NOTE — Progress Notes (Signed)
Patient complained of nausea this morning. CBG 116. No emesis noted. Given 4mg  zofran IV prn.

## 2014-02-05 NOTE — Plan of Care (Signed)
Problem: Consults Goal: Skin Care Protocol Initiated - if Braden Score 18 or less If consults are not indicated, leave blank or document N/A  Outcome: Completed/Met Date Met:  02/09/2014 Foam in place, patient is able to turn herself.     

## 2014-02-05 NOTE — Progress Notes (Signed)
Patient removed her IV access in right thumb this morning. Unable to visualize IV access. 2 RNs assessed. IV team consult placed.

## 2014-02-06 ENCOUNTER — Inpatient Hospital Stay (HOSPITAL_COMMUNITY): Payer: Medicare Other

## 2014-02-06 DIAGNOSIS — J9601 Acute respiratory failure with hypoxia: Secondary | ICD-10-CM

## 2014-02-06 DIAGNOSIS — Z515 Encounter for palliative care: Secondary | ICD-10-CM

## 2014-02-06 DIAGNOSIS — G9340 Encephalopathy, unspecified: Secondary | ICD-10-CM | POA: Insufficient documentation

## 2014-02-06 LAB — BASIC METABOLIC PANEL
Anion gap: 15 (ref 5–15)
BUN: 38 mg/dL — ABNORMAL HIGH (ref 6–23)
CALCIUM: 9.1 mg/dL (ref 8.4–10.5)
CO2: 31 meq/L (ref 19–32)
Chloride: 99 mEq/L (ref 96–112)
Creatinine, Ser: 1.89 mg/dL — ABNORMAL HIGH (ref 0.50–1.10)
GFR calc Af Amer: 28 mL/min — ABNORMAL LOW (ref >90)
GFR calc non Af Amer: 25 mL/min — ABNORMAL LOW (ref >90)
GLUCOSE: 85 mg/dL (ref 70–99)
Potassium: 4.7 mEq/L (ref 3.7–5.3)
Sodium: 145 mEq/L (ref 137–147)

## 2014-02-06 LAB — GLUCOSE, CAPILLARY
GLUCOSE-CAPILLARY: 120 mg/dL — AB (ref 70–99)
GLUCOSE-CAPILLARY: 161 mg/dL — AB (ref 70–99)
GLUCOSE-CAPILLARY: 248 mg/dL — AB (ref 70–99)
GLUCOSE-CAPILLARY: 234 mg/dL — AB (ref 70–99)
Glucose-Capillary: 110 mg/dL — ABNORMAL HIGH (ref 70–99)
Glucose-Capillary: 97 mg/dL (ref 70–99)

## 2014-02-06 LAB — BLOOD GAS, ARTERIAL
Acid-Base Excess: 6.6 mmol/L — ABNORMAL HIGH (ref 0.0–2.0)
Bicarbonate: 32.3 mEq/L — ABNORMAL HIGH (ref 20.0–24.0)
Delivery systems: POSITIVE
Drawn by: 39898
Expiratory PAP: 4
FIO2: 0.4 %
Inspiratory PAP: 14
O2 Saturation: 91.2 %
PCO2 ART: 62.9 mmHg — AB (ref 35.0–45.0)
PO2 ART: 64.2 mmHg — AB (ref 80.0–100.0)
Patient temperature: 98.6
TCO2: 34.2 mmol/L (ref 0–100)
pH, Arterial: 7.331 — ABNORMAL LOW (ref 7.350–7.450)

## 2014-02-06 LAB — CBC
HCT: 30.5 % — ABNORMAL LOW (ref 36.0–46.0)
HEMOGLOBIN: 8.9 g/dL — AB (ref 12.0–15.0)
MCH: 26.6 pg (ref 26.0–34.0)
MCHC: 29.2 g/dL — ABNORMAL LOW (ref 30.0–36.0)
MCV: 91 fL (ref 78.0–100.0)
Platelets: 210 10*3/uL (ref 150–400)
RBC: 3.35 MIL/uL — AB (ref 3.87–5.11)
RDW: 16.3 % — ABNORMAL HIGH (ref 11.5–15.5)
WBC: 7 10*3/uL (ref 4.0–10.5)

## 2014-02-06 LAB — AMMONIA: AMMONIA: 26 umol/L (ref 11–60)

## 2014-02-06 LAB — PRO B NATRIURETIC PEPTIDE: PRO B NATRI PEPTIDE: 38544 pg/mL — AB (ref 0–450)

## 2014-02-06 MED ORDER — HALOPERIDOL LACTATE 5 MG/ML IJ SOLN
2.5000 mg | Freq: Once | INTRAMUSCULAR | Status: DC
  Administered 2014-02-06: 2.5 mg via INTRAVENOUS

## 2014-02-06 MED ORDER — HALOPERIDOL LACTATE 5 MG/ML IJ SOLN
3.0000 mg | Freq: Four times a day (QID) | INTRAMUSCULAR | Status: DC | PRN
Start: 2014-02-06 — End: 2014-02-11

## 2014-02-06 MED ORDER — FUROSEMIDE 10 MG/ML IJ SOLN
40.0000 mg | Freq: Two times a day (BID) | INTRAMUSCULAR | Status: DC
  Administered 2014-02-06 – 2014-02-07 (×3): 40 mg via INTRAVENOUS
  Filled 2014-02-06 (×5): qty 4

## 2014-02-06 MED ORDER — SUCRALFATE 1 GM/10ML PO SUSP
1.0000 g | Freq: Three times a day (TID) | ORAL | Status: DC
  Administered 2014-02-06 – 2014-02-13 (×29): 1 g via ORAL
  Filled 2014-02-06 (×40): qty 10

## 2014-02-06 MED ORDER — SORBITOL 70 % SOLN
30.0000 mL | Freq: Every day | Status: DC | PRN
  Filled 2014-02-06: qty 30

## 2014-02-06 MED ORDER — HALOPERIDOL LACTATE 5 MG/ML IJ SOLN
INTRAMUSCULAR | Status: AC
  Filled 2014-02-06: qty 1

## 2014-02-06 NOTE — Progress Notes (Signed)
02/06/14 1320 Letha Capeeborah Seger Jani RN, BSN [phone] NCM received information that patient is from [facility] and does not want to return to [facility] but is interested in [facility].  NCM had Tommy with [facility] check to make sure patient is appropriate.  Per Orvilla Fusommy , patient is appropriate for [facility], but patient has been transferred to SDU.  NCM informed NCM on SDU of this information.

## 2014-02-06 NOTE — Progress Notes (Signed)
Chaplain responded to spiritual care consult. Pt family has advanced directive at home. Chaplain asked family to have pt nurse page chaplain when advanced directive is completed and present in order to take next steps. Chaplain will notify unit chaplain to expect completion on Monday, 02/09/14.   02/06/14 1500  Clinical Encounter Type  Visited With Patient and family together  Visit Type Initial;Spiritual support  Referral From Nurse  Stress Factors  Family Stress Factors Health changes  Advance Directives (For Healthcare)  Would patient like information on creating an advanced directive? Yes - Spiritual care consult ordered;Yes - Educational materials given  Jiles HaroldStamey, Xaniyah Buchholz F, Chaplain 02/06/2014 3:44 PM

## 2014-02-06 NOTE — Progress Notes (Signed)
Pt has not voided since 1640. Bladder scan showed 440cc of urine in bladder. MD notified and got order for Foley. Went into pt room to insert Foley catheter with no complications. However, the pt, who was previously lethargic and hard to arouse, become agitated and combative. Pt removed IV, newly inserted Foley, and BiPAP. Oxygen sat dropped to 50's and was bagged until Respiratory could arrive. The MD was notified and ordered Haldol IM and restraints. Rapid response was consulted as well.   Pt is now resting. Will continue to monitor.   Alba DestineMisty L Ennis, RN, BSN

## 2014-02-06 NOTE — Progress Notes (Signed)
Subjective: Feeling and breathing better.  She is sitting up on the edge of the bed.  No CP  Objective: Vital signs in last 24 hours: Temp:  [96.5 F (35.8 C)-99.5 F (37.5 C)] 98.5 F (36.9 C) (12/04 0730) Pulse Rate:  [76-94] 87 (12/04 0533) Resp:  [11-20] 13 (12/04 0533) BP: (133-149)/(43-64) 133/48 mmHg (12/04 0533) SpO2:  [96 %-100 %] 99 % (12/04 0533) FiO2 (%):  [40 %] 40 % (12/04 0006) Weight:  [169 lb 12.1 oz (77 kg)] 169 lb 12.1 oz (77 kg) (12/03 1837) Last BM Date: 02/03/14  Intake/Output from previous day: 12/03 0701 - 12/04 0700 In: 90 [I.V.:90] Out: 1100 [Urine:1100] Intake/Output this shift: Total I/O In: 20 [I.V.:20] Out: -   Medications Current Facility-Administered Medications  Medication Dose Route Frequency Provider Last Rate Last Dose  . 0.9 %  sodium chloride infusion  250 mL Intravenous PRN Therisa Doyne, MD      . acetaminophen (TYLENOL) tablet 650 mg  650 mg Oral Q6H PRN Therisa Doyne, MD       Or  . acetaminophen (TYLENOL) suppository 650 mg  650 mg Rectal Q6H PRN Therisa Doyne, MD      . albuterol (PROVENTIL) (2.5 MG/3ML) 0.083% nebulizer solution 2.5 mg  2.5 mg Nebulization Q2H PRN Therisa Doyne, MD      . amitriptyline (ELAVIL) tablet 50 mg  50 mg Oral QHS Therisa Doyne, MD   50 mg at 02/15/2014 0005  . antiseptic oral rinse (CPC / CETYLPYRIDINIUM CHLORIDE 0.05%) solution 7 mL  7 mL Mouth Rinse BID Therisa Doyne, MD   7 mL at 02/21/2014 2200  . carvedilol (COREG) tablet 6.25 mg  6.25 mg Oral BID WC Therisa Doyne, MD   6.25 mg at 03/05/2014 0830  . ceFEPIme (MAXIPIME) 2 g in dextrose 5 % 50 mL IVPB  2 g Intravenous Q24H Leatha Gilding, MD   2 g at 02/27/2014 1724  . docusate sodium (COLACE) capsule 100 mg  100 mg Oral BID Therisa Doyne, MD   100 mg at 03/04/2014 1009  . enoxaparin (LOVENOX) injection 80 mg  80 mg Subcutaneous Q24H Leatha Gilding, MD   80 mg at 02/27/2014 2142  . feeding supplement (GLUCERNA  SHAKE) (GLUCERNA SHAKE) liquid 237 mL  237 mL Oral BID BM Normand Sloop, RD   237 mL at 02/15/2014 1443  . furosemide (LASIX) tablet 40 mg  40 mg Oral BID Therisa Doyne, MD   40 mg at 03/01/2014 5366  . haloperidol lactate (HALDOL) injection 3 mg  3 mg Intramuscular Q6H PRN Roma Kayser Schorr, NP      . hydrALAZINE (APRESOLINE) tablet 50 mg  50 mg Oral 3 times per day Therisa Doyne, MD   Stopped at 02/06/14 0600  . HYDROcodone-acetaminophen (NORCO/VICODIN) 5-325 MG per tablet 1 tablet  1 tablet Oral Q8H PRN Therisa Doyne, MD      . HYDROcodone-acetaminophen (NORCO/VICODIN) 5-325 MG per tablet 1-2 tablet  1-2 tablet Oral Q4H PRN Therisa Doyne, MD      . insulin aspart (novoLOG) injection 0-9 Units  0-9 Units Subcutaneous 6 times per day Therisa Doyne, MD   1 Units at 02/08/2014 2028  . ipratropium-albuterol (DUONEB) 0.5-2.5 (3) MG/3ML nebulizer solution 3 mL  3 mL Nebulization Q6H PRN Therisa Doyne, MD      . iron polysaccharides (NIFEREX) capsule 150 mg  150 mg Oral BID WC Therisa Doyne, MD   150 mg at 02/21/2014 0832  . isosorbide dinitrate (  ISORDIL) tablet 40 mg  40 mg Oral TID WC Therisa DoyneAnastassia Doutova, MD   40 mg at 02/03/2014 1225  . ondansetron (ZOFRAN) tablet 4 mg  4 mg Oral Q6H PRN Therisa DoyneAnastassia Doutova, MD       Or  . ondansetron (ZOFRAN) injection 4 mg  4 mg Intravenous Q6H PRN Therisa DoyneAnastassia Doutova, MD   4 mg at 02/17/2014 0645  . pantoprazole (PROTONIX) EC tablet 40 mg  40 mg Oral Daily Therisa DoyneAnastassia Doutova, MD   40 mg at 02/23/2014 1011  . sodium chloride 0.9 % injection 3 mL  3 mL Intravenous Q12H Therisa DoyneAnastassia Doutova, MD   3 mL at 02/07/2014 2145  . sodium chloride 0.9 % injection 3 mL  3 mL Intravenous PRN Therisa DoyneAnastassia Doutova, MD      . vancomycin (VANCOCIN) IVPB 1000 mg/200 mL premix  1,000 mg Intravenous Q24H Leatha Gildingostin M Gherghe, MD   1,000 mg at 02/24/2014 1758    PE: General appearance: alert, cooperative and no distress Lungs: Decreased BS throughout. > on the left.   Diffuse crackles.  Heart: regular rate and rhythm, S1, S2 normal, no murmur, click, rub or gallop Abdomen: +BS, soft.  Mildly tender right side Extremities: No LEE Pulses: 2+ radials Skin: Warm and dry Neurologic: Grossly normal  Lab Results:   Recent Labs  03/02/2014 2325 02/24/2014 0515 02/06/14 0320  WBC 6.2 6.2 7.0  HGB 8.8* 9.2* 8.9*  HCT 28.7* 29.9* 30.5*  PLT 207 192 210   BMET  Recent Labs  02/25/2014 2325 02/28/2014 0515 02/06/14 0320  NA 141 143 145  K 4.3 3.9 4.7  CL 95* 97 99  CO2 32 32 31  GLUCOSE 181* 89 85  BUN 40* 40* 38*  CREATININE 1.91* 1.88* 1.89*  CALCIUM 9.2 9.3 9.1   PT/INR  Recent Labs  02/07/2014 2325 02/08/2014 0515  LABPROT 22.8* 22.5*  INR 1.99* 1.96*   CHEST 2 VIEW  COMPARISON: Chest radiograph performed 01/27/2014  FINDINGS: The lungs are well-aerated. Moderate right and small left pleural effusions are seen, with bibasilar airspace opacification, similar in appearance to the prior study, concerning for persistent mild pulmonary edema. Underlying vascular congestion is seen. No pneumothorax is identified.  The heart is enlarged. No acute osseous abnormalities are seen.  IMPRESSION: Moderate and small left pleural effusions, with bibasilar airspace opacification, similar in appearance to the prior study and concerning for persistent mild pulmonary edema. Cardiomegaly noted.   Assessment/Plan 79 year old female patient of Dr. Myrtis SerKatz with a history of atrial fibrillation, severe peripheral vascular disease, diabetes, diastolic heart failure here with left nonhealing foot ulcer admitted for above knee amputation by Dr. Edilia Boickson of vascular surgery with chronic kidney disease stage III. On chronic anticoagulation for atrial fibrillation, holding Coumadin, vitamin K administered. Prior stroke in 2009.  EF 45-50%, G2DD, akinesis of the mid-apicalanteroseptal myocardium, peak PA pressure 65    Foot ulcer, left   Chronic atrial  fibrillation   Essential hypertension   Diabetes mellitus   Warfarin anticoagulation   Chronic diastolic CHF (congestive heart failure)   PAD (peripheral artery disease)   CKD (chronic kidney disease) stage 3, GFR 30-59 ml/min   Foot ulcer  1. Paroxysmal atrial  Maintaining sinus rhythm.  She had some frequent PACs.  It would not be unusual for her to demonstrate perioperative atrial fibrillation. On carvedilol.  2. Chronic anticoagulation- Coumadin has been held, vitamin K has been administered per primary team. Last INR yesterday 1.96.  Resume when felt comfortable.  3. Acute on  Chronic diastolic heart failure/likely secondary pulmonary hypertension IV Lasix given yesterday.   CXR 12/2: Moderate and small left pleural effusions, with bibasilar airspace opacification, similar in appearance to the prior study and concerning for persistent mild pulmonary edema.  On Isordil and hydralazine.  Net fluids:  -1.0L.  She has more fluid to loose based on her lung exam. No LEE.  She is SP two IV doses yesterday and on PO now 40mg  BID.  Continue to monitor SCr and I/O's.   4.  Acute encephalopathy  CT head this morning: No acute intracranial abnormality.  No definite acute cortical infarction  5.  Acute on Chronic hypercarbic resp failure  Bipap started yesterday.  Likely some pulmonary edema.  On lasix.   LOS: 2 days    HAGER, BRYAN PA-C 02/06/2014 11:58 AM  As above; patient seen and examined; denies CP or dyspnea; responding appropriately at present; remains in sinus; coumadin on hold; for AKA next week; change lasix to 40 mg IV BID and she has pulmonary edema on exam. Follow renal function. Olga MillersBrian Crenshaw

## 2014-02-06 NOTE — Progress Notes (Signed)
CRITICAL VALUE ALERT  Critical value received:     Arterial Blood Gas pH   7.331 PCO2   62.9 PO2   64.2 Bicarb   32.3   Date of notification:  02/06/14  Time of notification:  0445  Critical value read back: Yes  Nurse who received alert:  Janice NorrieMisty Ennis  MD notified (1st page):  Schorr  Time of first page:  [phone]  MD notified (2nd page):  Time of second page:  Responding MD:   Time MD responded:

## 2014-02-06 NOTE — Consult Note (Signed)
Patient [name]      DOB: Oct 31, 1934      RJJ:[id number]     Consult Note from the Palliative Medicine Team at [place] Requested by: Imogene Burn, PA     PCP: Manon Hilding, MD Reason for Consultation: [place]      Phone Number:[phone]  Assessment of patients Current state: I met today with [name] and her daughter-in-law, Dolores Hoose, at bedside. [name] is sitting up and finishing her entire lunch as I enter and apparently much more awake/alert than the past few days. They tell me that she was doing very well until the end of Sept 2015 when she had toes on her right foot amputated d/t diabetes according to family. She has had issues with encephalopathy and required BiPAP and was diagnosed with COPD at [facility] at this time. She has many complex medical issues that they are struggling to comprehend because she was so very independent and active prior to the toe amputation. She was living with her son and brother but doing her own laundry, cooking all their meals, walked with walker, went out to lunch with her sisters. She has not driven since her stroke in 2009. She worked in a [employer] and had much exposure to secondhand smoke over many years.   When discussed my concern with her encephalopathy/hypercarbia and requiring BiPAP and also with anesthesia with her surgery. We discussed intubation and resuscitation. Ms. Romaniello tells me after some thought that she would want intubation and resuscitation initiated but she would not want long term ventilation. She is clearly uncomfortable discussing these matters and I tried to reassure her and explain that it is important for her family to know her wishes. She will need continued conversation about expectations given her complex comorbitities for short term and long term. I will follow up on Monday. Please call [phone] with any acute palliative needs over the weekend.    Goals of Care: 1.  Code Status: FULL  - no long term ventilation desired.    2. Disposition: To be determined on outcomes. They say they would prefer [facility] over [facility].    3. Symptom Management:   1. Pain: Denies.  2. Constipation: Colace BID. Sorbitol daily prn.  3. Encephalopathy: Continue BiPAP as indicated. Avoid sedating medications.   4. Psychosocial: Emotional support provided to patient and family at bedside.    Brief HPI: 79 yo female admitted from [facility] with plans for left AKA d/t nonhealing wound (with PVD and diabetes) but surgery has been complicated and delayed d/t recurrent encephalopathy/hypercarbia. Continues to have issues with pulmonary edema and pleural effusions. PMH significant for stroke, COPD, HTN, CKD stage IV, CHF, diabetes mellitus type I, vertigo, peripheral neuropathy, PVD.    ROS: Denies pain, anxiety. + altering mental status.     PMH:  Past Medical History  Diagnosis Date  . Paroxysmal atrial fibrillation     CHADS2 (4). Coumadin Rx  . Hypertension   . Stroke     dysarthria, resolved 2009  . Dyslipidemia   . Peripheral neuropathy   . Amputation, traumatic, toes     two toes right foot  . Cataract 2011  . Ejection fraction     EF 60-65% 09/2007, echo  . Warfarin anticoagulation     Atrial fib  . Vertigo     Vertigo with a fall 03/2012  . IDDM (insulin dependent diabetes mellitus)   . Complication of anesthesia     "11/2013 "  got Ativan & Morphine; got wild"  . CHF (congestive heart failure)   . COPD (chronic obstructive pulmonary disease)   . GERD (gastroesophageal reflux disease)   . TIA (transient ischemic attack) 2008   . Arthritis     "fingers, toes, feet, legs, all over I reckon"  . Chronic renal insufficiency   . Kidney stones     "passed them"  . Chronic kidney disease (CKD), stage IV (severe)      PSH: Past Surgical History  Procedure Laterality Date  . Appendectomy    . Cholecystectomy    . Toe amputation Right 2009    "big toe; little toe  .  Tonsillectomy    . Toe amputation  11/2013    1st and 2nd digits  . Cataract extraction Right    I have reviewed the FH and SH and  If appropriate update it with new information. Allergies  Allergen Reactions  . Ativan [Lorazepam] Other (See Comments)    Unknown  . Morphine And Related Other (See Comments)    MAR  . Xanax Xr [Alprazolam Er]     Oversedation    Scheduled Meds: . amitriptyline  50 mg Oral QHS  . antiseptic oral rinse  7 mL Mouth Rinse BID  . carvedilol  6.25 mg Oral BID WC  . ceFEPIme (MAXIPIME) 2 GM IVP  2 g Intravenous Q24H  . docusate sodium  100 mg Oral BID  . enoxaparin  80 mg Subcutaneous Q24H  . feeding supplement (GLUCERNA SHAKE)  237 mL Oral BID BM  . furosemide  40 mg Intravenous BID  . hydrALAZINE  50 mg Oral 3 times per day  . insulin aspart  0-9 Units Subcutaneous 6 times per day  . iron polysaccharides  150 mg Oral BID WC  . isosorbide dinitrate  40 mg Oral TID WC  . pantoprazole  40 mg Oral Daily  . sodium chloride  3 mL Intravenous Q12H  . vancomycin  1,000 mg Intravenous Q24H   Continuous Infusions:  PRN Meds:.sodium chloride, acetaminophen **OR** acetaminophen, albuterol, haloperidol lactate, HYDROcodone-acetaminophen, HYDROcodone-acetaminophen, ipratropium-albuterol, ondansetron **OR** ondansetron (ZOFRAN) IV, sodium chloride    BP 157/65 mmHg  Pulse 95  Temp(Src) 98.5 F (36.9 C) (Axillary)  Resp 17  Ht $R'5\' 5"'gp$  (1.651 m)  Wt 77 kg (169 lb 12.1 oz)  BMI 28.25 kg/m2  SpO2 100%   PPS: 30%   Intake/Output Summary (Last 24 hours) at 02/06/14 1439 Last data filed at 02/06/14 1344  Gross per 24 hour  Intake    113 ml  Output   1100 ml  Net   -987 ml   LBM: 02/03/14  Physical Exam:  General:  NAD, sitting on side of bed HEENT: [place]/AT, no JVD, moist mucous membranes Chest: No labored breathing, symmetric CVS: RRR, S1 S2 Abdomen: Soft, NT, ND Ext: MAE, no edema, warm to touch Neuro: Awake, alert, oriented x 3  Labs: CBC     Component Value Date/Time   WBC 7.0 02/06/2014 0320   RBC 3.35* 02/06/2014 0320   HGB 8.9* 02/06/2014 0320   HCT 30.5* 02/06/2014 0320   PLT 210 02/06/2014 0320   MCV 91.0 02/06/2014 0320   MCH 26.6 02/06/2014 0320   MCHC 29.2* 02/06/2014 0320   RDW 16.3* 02/06/2014 0320   LYMPHSABS 1.0 03/04/2014 2325   MONOABS 0.7 03/05/2014 2325   EOSABS 0.2 02/08/2014 2325   BASOSABS 0.0 02/25/2014 2325    BMET    Component Value Date/Time   NA  145 02/06/2014 0320   K 4.7 02/06/2014 0320   CL 99 02/06/2014 0320   CO2 31 02/06/2014 0320   GLUCOSE 85 02/06/2014 0320   BUN 38* 02/06/2014 0320   CREATININE 1.89* 02/06/2014 0320   CALCIUM 9.1 02/06/2014 0320   GFRNONAA 25* 02/06/2014 0320   GFRAA 28* 02/06/2014 0320    CMP     Component Value Date/Time   NA 145 02/06/2014 0320   K 4.7 02/06/2014 0320   CL 99 02/06/2014 0320   CO2 31 02/06/2014 0320   GLUCOSE 85 02/06/2014 0320   BUN 38* 02/06/2014 0320   CREATININE 1.89* 02/06/2014 0320   CALCIUM 9.1 02/06/2014 0320   PROT 7.2 02/20/2014 0515   ALBUMIN 2.9* 03/05/2014 0515   AST 15 02/10/2014 0515   ALT 10 02/25/2014 0515   ALKPHOS 79 03/04/2014 0515   BILITOT 0.3 02/17/2014 0515   GFRNONAA 25* 02/06/2014 0320   GFRAA 28* 02/06/2014 0320     Time In Time Out Total Time Spent with Patient Total Overall Time  1330 1445 31min 55min    Greater than 50%  of this time was spent counseling and coordinating care related to the above assessment and plan.  Vinie Sill, NP Palliative Medicine Team Pager # [phone] (M-F 8a-5p) Team Phone # [phone] (Nights/Weekends)

## 2014-02-06 NOTE — Progress Notes (Signed)
PROGRESS NOTE  [name] [id number]:[id number] DOB: 05/25/1934 DOA: 02/23/2014 PCP: Estanislado PandySASSER,PAUL W, MD  HPI Patient is a 79 yo female with a PMH of afib, (on warfarin), CVA, PVD, CHF, CKD IV, and pulmonary hypertension presented to the ER from [facility] for AKA due to non healing lower extremity wound.  Subjective:   - transferred lat night to SDU to to persistent lethargy and hypercarbia  Assessment/Plan:  Acute encephalopathy Uncertain etiology, probably multifactorial due to ?infection, hypercarbia,  CXR shows pleural effusions and pulmonary edema without infection. Patient has received no benzodiazepines and very little narcotics. Surgery was cancelled due to encephalopathy. Started empiric vanc/cefepime 12/3 CT scan negative this morning  Acute on chronic hypercarbic respiratory failure - BiPAP  PVD with non healing wound For AKA when stable.  Vascular will re-evaluate and consider amputation next week Cardiology following  Afib Holding coumadin. Vitamin K given to reverse warfarin prior to surgery. Will ask pharmacy to dose lovenox as surgery may be delayed until Tuesday.  Chronic Kidney disease stage IV Baseline creatinine is approximate 2.2.  Creatinine is currently lower than baseline.  Bilateral Pleural Effusions with chronic mild pulmonary edema. IV lasix x 1 today   Acute on chronic combined systolic and diastolic heart failure. 2D echo results below. Lasix as needed. Coreg, isordil.  HTN Well controlled on HTN, Coreg, isordil.  DM II  CBGs well controlled on current insulin regimen. A1C is 6.9.  Goals of care - palliative consulted, appreciate input. Currently full code   DVT Prophylaxis:  lovenox.   Code Status: Currently full code.  Requesting palliative consultation. Family Communication: Frederik Schmidtosalie and Harvie HeckRandy at bedside. Disposition Plan: Inpatient.   Consultants:  Vascular  Cardiology  Palliative   Procedures:  2D Echo The estimated  ejection fraction was in the range of 45% to 50%. There is akinesis of the mid-apicalanteroseptal myocardium. Features are consistent with a pseudonormal left ventricular filling pattern, with concomitant abnormal relaxation and increased filling pressure (grade 2 diastolic dysfunction).  Antibiotics: Anti-infectives    Start     Dose/Rate Route Frequency Ordered Stop   02/17/2014 1900  vancomycin (VANCOCIN) IVPB 1000 mg/200 mL premix     1,000 mg200 mL/hr over 60 Minutes Intravenous Every 24 hours 02/15/2014 1701     02/15/2014 1800  ceFEPIme (MAXIPIME) 2 g in dextrose 5 % 50 mL IVPB     2 g100 mL/hr over 30 Minutes Intravenous Every 24 hours 02/18/2014 1700        Will start cefepime and Vanc. 12/3.    Objective: Filed Vitals:   02/06/14 0006 02/06/14 0315 02/06/14 0533 02/06/14 0730  BP:   133/48   Pulse: 89 105 87   Temp:   99.5 F (37.5 C) 98.5 F (36.9 C)  TempSrc:   Axillary Axillary  Resp: 12 27 13    Height:      Weight:      SpO2: 97% 97% 99%     Intake/Output Summary (Last 24 hours) at 02/06/14 1201 Last data filed at 02/06/14 1100  Gross per 24 hour  Intake    110 ml  Output   1100 ml  Net   -990 ml   Filed Weights   03/04/2014 0342 02/12/2014 1837  Weight: 81.2 kg (179 lb 0.2 oz) 77 kg (169 lb 12.1 oz)   Exam: General: Well developed, slumped over in recliner, unable to speak to me.  Dtr at bedside. HEENT:   MMM.  Neck: Supple, no JVD, no masses  Cardiovascular: RRR, S1 S2 auscultated, no rubs, murmurs or gallops.   Respiratory: Clear to auscultation bilaterally with equal chest rise  Abdomen: Soft, nontender, slightly distended, + bowel sounds  Extremities: 2-3+ edema, warm.  LLE ulceration wrapped in clean gauze. Neuro: lethargic, unable to follow commands or hold up her head.  Data Reviewed: Basic Metabolic Panel:  Recent Labs Lab 29-Oct-2013 2325 02/20/2014 0515 02/06/14 0320  NA 141 143 145  K 4.3 3.9 4.7  CL 95* 97 99  CO2 32 32 31  GLUCOSE 181* 89  85  BUN 40* 40* 38*  CREATININE 1.91* 1.88* 1.89*  CALCIUM 9.2 9.3 9.1  MG  --  2.0  --   PHOS  --  4.0  --    Liver Function Tests:  Recent Labs Lab 29-Oct-2013 2325 02/11/2014 0515  AST 18 15  ALT 11 10  ALKPHOS 76 79  BILITOT 0.3 0.3  PROT 7.2 7.2  ALBUMIN 2.7* 2.9*   CBC:  Recent Labs Lab 29-Oct-2013 2325 02/19/2014 0515 02/06/14 0320  WBC 6.2 6.2 7.0  NEUTROABS 4.3  --   --   HGB 8.8* 9.2* 8.9*  HCT 28.7* 29.9* 30.5*  MCV 88.3 90.3 91.0  PLT 207 192 210  CBG:  Recent Labs Lab 03/01/2014 1653 02/28/2014 2021 02/20/2014 2355 02/06/14 0530 02/06/14 0833  GLUCAP 177* 123* 97 120* 110*   Recent Results (from the past 240 hour(s))  Surgical pcr screen     Status: None   Collection Time: 02/28/2014 12:17 AM  Result Value Ref Range Status   MRSA, PCR NEGATIVE NEGATIVE Final   Staphylococcus aureus NEGATIVE NEGATIVE Final    Comment:        The Xpert SA Assay (FDA approved for NASAL specimens in patients over 77 years of age), is one component of a comprehensive surveillance program.  Test performance has been validated by [facility] for patients greater than or equal to 286 year old. It is not intended to diagnose infection nor to guide or monitor treatment.    Studies: Dg Chest 2 View  02/26/2014   CLINICAL DATA:  Preoperative chest radiograph for amputation above the knee. Cough and altered mental status. Initial encounter.  EXAM: CHEST  2 VIEW  COMPARISON:  Chest radiograph performed 01/27/2014  FINDINGS: The lungs are well-aerated. Moderate right and small left pleural effusions are seen, with bibasilar airspace opacification, similar in appearance to the prior study, concerning for persistent mild pulmonary edema. Underlying vascular congestion is seen. No pneumothorax is identified.  The heart is enlarged.  No acute osseous abnormalities are seen.  IMPRESSION: Moderate and small left pleural effusions, with bibasilar airspace opacification, similar in appearance to  the prior study and concerning for persistent mild pulmonary edema. Cardiomegaly noted.   Electronically Signed   By: Roanna RaiderJeffery  Chang M.D.   On: 02/24/2014 03:50   Ct Head Wo Contrast  02/06/2014   CLINICAL DATA:  Acute encephalopathy  EXAM: CT HEAD WITHOUT CONTRAST  TECHNIQUE: Contiguous axial images were obtained from the base of the skull through the vertex without intravenous contrast.  COMPARISON:  01/28/2014  FINDINGS: No skull fracture is noted. Paranasal sinuses and mastoid air cells are unremarkable. Stable cerebral atrophy. No intracranial hemorrhage, mass effect or midline shift. Again noted periventricular chronic white matter disease. No definite acute cortical infarction. No mass lesion is noted on this unenhanced scan. Atherosclerotic calcifications of carotid siphon.  IMPRESSION: No acute intracranial abnormality. Stable atrophy and chronic white matter disease. No definite  acute cortical infarction.   Electronically Signed   By: Natasha Mead M.D.   On: 02/06/2014 11:45   Scheduled Meds: . amitriptyline  50 mg Oral QHS  . antiseptic oral rinse  7 mL Mouth Rinse BID  . carvedilol  6.25 mg Oral BID WC  . ceFEPIme (MAXIPIME) 2 GM IVP  2 g Intravenous Q24H  . docusate sodium  100 mg Oral BID  . enoxaparin  80 mg Subcutaneous Q24H  . feeding supplement (GLUCERNA SHAKE)  237 mL Oral BID BM  . furosemide  40 mg Oral BID  . hydrALAZINE  50 mg Oral 3 times per day  . insulin aspart  0-9 Units Subcutaneous 6 times per day  . iron polysaccharides  150 mg Oral BID WC  . isosorbide dinitrate  40 mg Oral TID WC  . pantoprazole  40 mg Oral Daily  . sodium chloride  3 mL Intravenous Q12H  . vancomycin  1,000 mg Intravenous Q24H   Continuous Infusions:   Principal Problem:   Foot ulcer, left Active Problems:   Chronic atrial fibrillation   Essential hypertension   Diabetes mellitus   Warfarin anticoagulation   Chronic diastolic CHF (congestive heart failure)   PAD (peripheral artery  disease)   CKD (chronic kidney disease) stage 3, GFR 30-59 ml/min   Foot ulcer   Time spent: 35 minutes  Pamella Pert, MD [facility] [phone] If 7PM-7AM, please contact night-coverage at [email or web address], password [facility]

## 2014-02-06 NOTE — Plan of Care (Signed)
Problem: Phase II Progression Outcomes Goal: Progress activity as tolerated unless otherwise ordered Outcome: Progressing Goal: Discharge plan established Outcome: Progressing Goal: Vital signs remain stable Outcome: Progressing     

## 2014-02-06 NOTE — Progress Notes (Signed)
Medicare Important Message given  Date Medicare IM given: 02/06/2014

## 2014-02-07 DIAGNOSIS — I739 Peripheral vascular disease, unspecified: Secondary | ICD-10-CM

## 2014-02-07 DIAGNOSIS — I509 Heart failure, unspecified: Secondary | ICD-10-CM | POA: Insufficient documentation

## 2014-02-07 LAB — GLUCOSE, CAPILLARY
Glucose-Capillary: 147 mg/dL — ABNORMAL HIGH (ref 70–99)
Glucose-Capillary: 127 mg/dL — ABNORMAL HIGH (ref 70–99)
Glucose-Capillary: 126 mg/dL — ABNORMAL HIGH (ref 70–99)
Glucose-Capillary: 185 mg/dL — ABNORMAL HIGH (ref 70–99)
Glucose-Capillary: 231 mg/dL — ABNORMAL HIGH (ref 70–99)
Glucose-Capillary: 261 mg/dL — ABNORMAL HIGH (ref 70–99)

## 2014-02-07 LAB — COMPREHENSIVE METABOLIC PANEL
ALT: 11 U/L (ref 0–35)
ANION GAP: 11 (ref 5–15)
AST: 16 U/L (ref 0–37)
Albumin: 2.5 g/dL — ABNORMAL LOW (ref 3.5–5.2)
Alkaline Phosphatase: 82 U/L (ref 39–117)
BUN: 42 mg/dL — AB (ref 6–23)
CALCIUM: 8.9 mg/dL (ref 8.4–10.5)
CO2: 33 meq/L — AB (ref 19–32)
Chloride: 97 mEq/L (ref 96–112)
Creatinine, Ser: 2.02 mg/dL — ABNORMAL HIGH (ref 0.50–1.10)
GFR, EST AFRICAN AMERICAN: 26 mL/min — AB (ref >90)
GFR, EST NON AFRICAN AMERICAN: 23 mL/min — AB (ref >90)
GLUCOSE: 124 mg/dL — AB (ref 70–99)
Potassium: 4 mEq/L (ref 3.7–5.3)
SODIUM: 141 meq/L (ref 137–147)
TOTAL PROTEIN: 6.6 g/dL (ref 6.0–8.3)
Total Bilirubin: 0.3 mg/dL (ref 0.3–1.2)

## 2014-02-07 LAB — CBC
HCT: 27.6 % — ABNORMAL LOW (ref 36.0–46.0)
HEMOGLOBIN: 8.5 g/dL — AB (ref 12.0–15.0)
MCH: 28.1 pg (ref 26.0–34.0)
MCHC: 30.8 g/dL (ref 30.0–36.0)
MCV: 91.4 fL (ref 78.0–100.0)
PLATELETS: 174 10*3/uL (ref 150–400)
RBC: 3.02 MIL/uL — ABNORMAL LOW (ref 3.87–5.11)
RDW: 16.1 % — ABNORMAL HIGH (ref 11.5–15.5)
WBC: 5.7 10*3/uL (ref 4.0–10.5)

## 2014-02-07 LAB — PROTIME-INR
INR: 1.83 — ABNORMAL HIGH (ref 0.00–1.49)
Prothrombin Time: 21.3 seconds — ABNORMAL HIGH (ref 11.6–15.2)

## 2014-02-07 MED ORDER — DEXTROSE 5 % IV SOLN
1.0000 g | INTRAVENOUS | Status: DC
  Administered 2014-02-07 – 2014-02-13 (×7): 1 g via INTRAVENOUS
  Filled 2014-02-07 (×10): qty 1

## 2014-02-07 NOTE — Plan of Care (Signed)
Problem: Phase II Progression Outcomes Goal: Progress activity as tolerated unless otherwise ordered Outcome: Progressing Goal: Discharge plan established Outcome: Progressing Goal: Vital signs remain stable Outcome: Progressing Goal: IV changed to normal saline lock Outcome: Completed/Met Date Met:  02/07/14

## 2014-02-07 NOTE — Progress Notes (Signed)
PROGRESS NOTE  HALLI EQUIHUA IWL:[id number] DOB: December 09, 1934 DOA: 02/15/2014 PCP: Manon Hilding, MD  HPI Patient is a 79 yo female with a PMH of afib, (on warfarin), CVA, PVD, CHF, CKD IV, and pulmonary hypertension presented to the ER from [facility] for AKA due to non healing lower extremity wound.  Subjective:   - She is much improved today, alert and oriented 4, and back to her baseline per family - She has little to no recollection of the events over the past 2 days  Assessment/Plan:  Acute encephalopathy Uncertain etiology, possibly infectious as this resolved with antibiotics. Continue to monitor Patient has received no benzodiazepines and very little narcotics. Continue vancomycin and cefepime   PVD with non healing wound For AKA when stable.  Vascular will re-evaluate and consider amputation next Tuesday. [facility] Consultation for surgical clearance.  Patient is moderate to high risk. Continue vancomycin and cefepime for presumed underlying infection and will keep on antibiotics until surgery   Afib Holding coumadin.  Will ask pharmacy to dose lovenox as surgery may be delayed until Tuesday.  Chronic Kidney disease stage IV Baseline creatinine is approximate 2.2.  Creatinine is currently lower than baseline.  Bilateral Pleural Effusions with chronic mild pulmonary edema. - Continue Lasix  - Cardiology following    Acute on chronic combined systolic and diastolic heart failure. 2D echo results below. Coreg, isordil.  HTN Well controlled on HTN, Coreg, isordil.  DM II  CBGs well controlled on current insulin regimen. A1C is 6.9.  Goals of care Palliative was consulted and they have met with the patient and the family. Fortunately the patient's mental status recovered to baseline and she expressed her wishes to continue to be full code and to continue with the surgery.  DVT Prophylaxis:  lovenox.  Code Status: Currently full code.   Family  Communication: Dolores Hoose and Louie Casa at bedside. Disposition Plan: Inpatient.   Consultants:  Vascular  Cardiology  Procedures:  2D Echo The estimated ejection fraction was in the range of 45% to 50%. There is akinesis of the mid-apicalanteroseptal myocardium. Features are consistent with a pseudonormal left ventricular filling pattern, with concomitant abnormal relaxation and increased filling pressure (grade 2 diastolic dysfunction).  Antibiotics: Vancomycin 12/3 >> Cefepime 12/3 >>  Objective: Filed Vitals:   02/07/14 0441 02/07/14 0500 02/07/14 0541 02/07/14 0804  BP: 146/64  149/58 165/69  Pulse: 71     Temp: 97.9 F (36.6 C)   97.4 F (36.3 C)  TempSrc: Oral   Oral  Resp: 13     Height:      Weight:  84.641 kg (186 lb 9.6 oz)    SpO2: 98%       Intake/Output Summary (Last 24 hours) at 02/07/14 1027 Last data filed at 02/07/14 0924  Gross per 24 hour  Intake    456 ml  Output    200 ml  Net    256 ml   Filed Weights   02/24/2014 0342 03/05/2014 1837 02/07/14 0500  Weight: 81.2 kg (179 lb 0.2 oz) 77 kg (169 lb 12.1 oz) 84.641 kg (186 lb 9.6 oz)   Exam: General: Well developed, slumped over in recliner, unable to speak to me.  Dtr at bedside. HEENT:   MMM.  Neck: Supple, no JVD, no masses  Cardiovascular: RRR, S1 S2 auscultated, no rubs, murmurs or gallops.   Respiratory: Clear to auscultation bilaterally with equal chest rise  Abdomen: Soft, nontender, slightly distended, + bowel sounds  Extremities:  2-3+ edema, warm.  LLE ulceration wrapped in clean gauze. Neuro: lethargic, unable to follow commands or hold up her head.  Data Reviewed: Basic Metabolic Panel:  Recent Labs Lab 02/23/2014 2325 03/02/2014 0515 02/06/14 0320 02/07/14 0407  NA 141 143 145 141  K 4.3 3.9 4.7 4.0  CL 95* 97 99 97  CO2 32 32 31 33*  GLUCOSE 181* 89 85 124*  BUN 40* 40* 38* 42*  CREATININE 1.91* 1.88* 1.89* 2.02*  CALCIUM 9.2 9.3 9.1 8.9  MG  --  2.0  --   --   PHOS  --  4.0   --   --    Liver Function Tests:  Recent Labs Lab 02/15/2014 2325 02/04/2014 0515 02/07/14 0407  AST $Re'18 15 16  'saB$ ALT $R'11 10 11  'ew$ ALKPHOS 76 79 82  BILITOT 0.3 0.3 0.3  PROT 7.2 7.2 6.6  ALBUMIN 2.7* 2.9* 2.5*   CBC:  Recent Labs Lab 02/25/2014 2325 02/19/2014 0515 02/06/14 0320 02/07/14 0407  WBC 6.2 6.2 7.0 5.7  NEUTROABS 4.3  --   --   --   HGB 8.8* 9.2* 8.9* 8.5*  HCT 28.7* 29.9* 30.5* 27.6*  MCV 88.3 90.3 91.0 91.4  PLT 207 192 210 174  CBG:  Recent Labs Lab 02/06/14 1731 02/06/14 1950 02/07/14 0047 02/07/14 0445 02/07/14 0801  GLUCAP 248* 234* 147* 127* 126*   Recent Results (from the past 240 hour(s))  Surgical pcr screen     Status: None   Collection Time: 02/11/2014 12:17 AM  Result Value Ref Range Status   MRSA, PCR NEGATIVE NEGATIVE Final   Staphylococcus aureus NEGATIVE NEGATIVE Final    Comment:        The Xpert SA Assay (FDA approved for NASAL specimens in patients over 27 years of age), is one component of a comprehensive surveillance program.  Test performance has been validated by [employer] for patients greater than or equal to 20 year old. It is not intended to diagnose infection nor to guide or monitor treatment.    Studies: Ct Head Wo Contrast  02/06/2014   CLINICAL DATA:  Acute encephalopathy  EXAM: CT HEAD WITHOUT CONTRAST  TECHNIQUE: Contiguous axial images were obtained from the base of the skull through the vertex without intravenous contrast.  COMPARISON:  01/28/2014  FINDINGS: No skull fracture is noted. Paranasal sinuses and mastoid air cells are unremarkable. Stable cerebral atrophy. No intracranial hemorrhage, mass effect or midline shift. Again noted periventricular chronic white matter disease. No definite acute cortical infarction. No mass lesion is noted on this unenhanced scan. Atherosclerotic calcifications of carotid siphon.  IMPRESSION: No acute intracranial abnormality. Stable atrophy and chronic white matter disease. No  definite acute cortical infarction.   Electronically Signed   By: Lahoma Crocker M.D.   On: 02/06/2014 11:45   Scheduled Meds: . amitriptyline  50 mg Oral QHS  . antiseptic oral rinse  7 mL Mouth Rinse BID  . carvedilol  6.25 mg Oral BID WC  . ceFEPIme (MAXIPIME) 2 GM IVP  2 g Intravenous Q24H  . docusate sodium  100 mg Oral BID  . enoxaparin  80 mg Subcutaneous Q24H  . feeding supplement (GLUCERNA SHAKE)  237 mL Oral BID BM  . furosemide  40 mg Intravenous BID  . hydrALAZINE  50 mg Oral 3 times per day  . insulin aspart  0-9 Units Subcutaneous 6 times per day  . iron polysaccharides  150 mg Oral BID WC  . isosorbide dinitrate  40 mg Oral TID WC  . pantoprazole  40 mg Oral Daily  . sodium chloride  3 mL Intravenous Q12H  . sucralfate  1 g Oral TID WC & HS  . vancomycin  1,000 mg Intravenous Q24H   Continuous Infusions:   Principal Problem:   Foot ulcer, left Active Problems:   Chronic atrial fibrillation   Essential hypertension   Diabetes mellitus   Warfarin anticoagulation   Chronic diastolic CHF (congestive heart failure)   PAD (peripheral artery disease)   CKD (chronic kidney disease) stage 3, GFR 30-59 ml/min   Foot ulcer   Palliative care encounter   Encephalopathy  Time spent: 25 minutes  If 7PM-7AM, please contact night-coverage at [email or web address], password [email or web address] Marzetta Board, MD [facility] [phone]

## 2014-02-07 NOTE — Progress Notes (Signed)
Subjective: Denies dyspnea or chest pain  Objective: Vital signs in last 24 hours: Temp:  [97.4 F (36.3 C)-98.5 F (36.9 C)] 97.4 F (36.3 C) (12/05 0804) Pulse Rate:  [71-96] 71 (12/05 0441) Resp:  [13-25] 13 (12/05 0441) BP: (132-165)/(49-111) 165/69 mmHg (12/05 0804) SpO2:  [96 %-100 %] 98 % (12/05 0441) Weight:  [186 lb 9.6 oz (84.641 kg)] 186 lb 9.6 oz (84.641 kg) (12/05 0500) Last BM Date: 02/03/14  Intake/Output from previous day: 12/04 0701 - 12/05 0700 In: 473 [P.O.:200; I.V.:23; IV Piggyback:250] Out: 200 [Urine:200] Intake/Output this shift: Total I/O In: 3 [I.V.:3] Out: -   Medications Current Facility-Administered Medications  Medication Dose Route Frequency Provider Last Rate Last Dose  . 0.9 %  sodium chloride infusion  250 mL Intravenous PRN Therisa DoyneAnastassia Doutova, MD      . acetaminophen (TYLENOL) tablet 650 mg  650 mg Oral Q6H PRN Therisa DoyneAnastassia Doutova, MD       Or  . acetaminophen (TYLENOL) suppository 650 mg  650 mg Rectal Q6H PRN Therisa DoyneAnastassia Doutova, MD      . albuterol (PROVENTIL) (2.5 MG/3ML) 0.083% nebulizer solution 2.5 mg  2.5 mg Nebulization Q2H PRN Therisa DoyneAnastassia Doutova, MD      . amitriptyline (ELAVIL) tablet 50 mg  50 mg Oral QHS Therisa DoyneAnastassia Doutova, MD   50 mg at 02/06/14 2232  . antiseptic oral rinse (CPC / CETYLPYRIDINIUM CHLORIDE 0.05%) solution 7 mL  7 mL Mouth Rinse BID Therisa DoyneAnastassia Doutova, MD   7 mL at 02/07/14 1000  . carvedilol (COREG) tablet 6.25 mg  6.25 mg Oral BID WC Therisa DoyneAnastassia Doutova, MD   6.25 mg at 02/07/14 0921  . ceFEPIme (MAXIPIME) 2 g in dextrose 5 % 50 mL IVPB  2 g Intravenous Q24H Leatha Gildingostin M Gherghe, MD   2 g at 02/06/14 1730  . docusate sodium (COLACE) capsule 100 mg  100 mg Oral BID Therisa DoyneAnastassia Doutova, MD   100 mg at 02/07/14 [id number]  . enoxaparin (LOVENOX) injection 80 mg  80 mg Subcutaneous Q24H Leatha Gildingostin M Gherghe, MD   80 mg at 02/06/14 1730  . feeding supplement (GLUCERNA SHAKE) (GLUCERNA SHAKE) liquid 237 mL  237 mL Oral BID BM  Normand SloopHaley H Lawrence, RD   237 mL at 02/06/14 1344  . furosemide (LASIX) injection 40 mg  40 mg Intravenous BID Lewayne BuntingBrian S Chanler Schreiter, MD   40 mg at 02/07/14 [id number]  . haloperidol lactate (HALDOL) injection 3 mg  3 mg Intramuscular Q6H PRN Roma KayserKatherine P Schorr, NP      . hydrALAZINE (APRESOLINE) tablet 50 mg  50 mg Oral 3 times per day Therisa DoyneAnastassia Doutova, MD   50 mg at 02/07/14 0541  . HYDROcodone-acetaminophen (NORCO/VICODIN) 5-325 MG per tablet 1 tablet  1 tablet Oral Q8H PRN Therisa DoyneAnastassia Doutova, MD      . HYDROcodone-acetaminophen (NORCO/VICODIN) 5-325 MG per tablet 1-2 tablet  1-2 tablet Oral Q4H PRN Therisa DoyneAnastassia Doutova, MD      . insulin aspart (novoLOG) injection 0-9 Units  0-9 Units Subcutaneous 6 times per day Therisa DoyneAnastassia Doutova, MD   1 Units at 02/07/14 [phone]  . ipratropium-albuterol (DUONEB) 0.5-2.5 (3) MG/3ML nebulizer solution 3 mL  3 mL Nebulization Q6H PRN Therisa DoyneAnastassia Doutova, MD      . iron polysaccharides (NIFEREX) capsule 150 mg  150 mg Oral BID WC Therisa DoyneAnastassia Doutova, MD   150 mg at 02/07/14 [id number]  . isosorbide dinitrate (ISORDIL) tablet 40 mg  40 mg Oral TID WC Therisa DoyneAnastassia Doutova, MD   40 mg  at 02/07/14 1324  . ondansetron (ZOFRAN) tablet 4 mg  4 mg Oral Q6H PRN Therisa Doyne, MD       Or  . ondansetron (ZOFRAN) injection 4 mg  4 mg Intravenous Q6H PRN Therisa Doyne, MD   4 mg at 02/25/2014 0645  . pantoprazole (PROTONIX) EC tablet 40 mg  40 mg Oral Daily Therisa Doyne, MD   40 mg at 02/07/14 0924  . sodium chloride 0.9 % injection 3 mL  3 mL Intravenous Q12H Therisa Doyne, MD   3 mL at 02/07/14 0924  . sodium chloride 0.9 % injection 3 mL  3 mL Intravenous PRN Therisa Doyne, MD      . sorbitol 70 % solution 30 mL  30 mL Oral Daily PRN Ulice Bold, NP      . sucralfate (CARAFATE) 1 GM/10ML suspension 1 g  1 g Oral TID WC & HS Costin Otelia Sergeant, MD   1 g at 02/07/14 0920  . vancomycin (VANCOCIN) IVPB 1000 mg/200 mL premix  1,000 mg Intravenous Q24H Leatha Gilding, MD    1,000 mg at 02/06/14 1730    PE: General appearance: alert, cooperative, chronically ill appearing in no distress HEENT normal Neck Supple Lungs: Decreased BS throughout. > on the left. Heart: regular rate and rhythm Abdomen: NT/ND, soft Extremities: 1-2+ LEE Skin: Warm and dry Neurologic: Grossly normal  Lab Results:   Recent Labs  02/12/2014 0515 02/06/14 0320 02/07/14 0407  WBC 6.2 7.0 5.7  HGB 9.2* 8.9* 8.5*  HCT 29.9* 30.5* 27.6*  PLT 192 210 174   BMET  Recent Labs  02/04/2014 0515 02/06/14 0320 02/07/14 0407  NA 143 145 141  K 3.9 4.7 4.0  CL 97 99 97  CO2 32 31 33*  GLUCOSE 89 85 124*  BUN 40* 38* 42*  CREATININE 1.88* 1.89* 2.02*  CALCIUM 9.3 9.1 8.9   PT/INR  Recent Labs  02/08/2014 2325 02/03/2014 0515 02/07/14 0407  LABPROT 22.8* 22.5* 21.3*  INR 1.99* 1.96* 1.83*   CHEST 2 VIEW  COMPARISON: Chest radiograph performed 01/27/2014  FINDINGS: The lungs are well-aerated. Moderate right and small left pleural effusions are seen, with bibasilar airspace opacification, similar in appearance to the prior study, concerning for persistent mild pulmonary edema. Underlying vascular congestion is seen. No pneumothorax is identified.  The heart is enlarged. No acute osseous abnormalities are seen.  IMPRESSION: Moderate and small left pleural effusions, with bibasilar airspace opacification, similar in appearance to the prior study and concerning for persistent mild pulmonary edema. Cardiomegaly noted.   Assessment/Plan 79 year old female patient of Dr. Myrtis Ser with a history of atrial fibrillation, severe peripheral vascular disease, diabetes, diastolic heart failure here with left nonhealing foot ulcer admitted for above knee amputation by Dr. Edilia Bo of vascular surgery with chronic kidney disease stage III. On chronic anticoagulation for atrial fibrillation, holding Coumadin, vitamin K administered. Prior stroke in 2009.  EF 45-50%, G2DD, akinesis of  the mid-apicalanteroseptal myocardium, peak PA pressure 65    Foot ulcer, left   Chronic atrial fibrillation   Essential hypertension   Diabetes mellitus   Warfarin anticoagulation   Chronic diastolic CHF (congestive heart failure)   PAD (peripheral artery disease)   CKD (chronic kidney disease) stage 3, GFR 30-59 ml/min   Foot ulcer  1. Paroxysmal atrial fibrillation Maintaining sinus rhythm. Holding coumadin for surgical procedure next week; continue carvedilol.  2. Chronic anticoagulation- Coumadin has been held, vitamin K has been administered per primary team. Resume postoperatively.  3. Acute on Chronic diastolic heart failure/likely secondary pulmonary hypertension Remains volume overloaded; Continue IV lasix today. Follow renal function. Continue to monitor SCr and I/O's.   4.  Acute encephalopathy  Improved.  5.  Chronic stage 3 renal failure  Monitor renal function with diuresis.   6.   Foot ulcer  For amputation next week; continue antibiotics.  LOS: 3 days    Milus HeightBrian CrenshawMD

## 2014-02-08 DIAGNOSIS — N179 Acute kidney failure, unspecified: Secondary | ICD-10-CM | POA: Diagnosis not present

## 2014-02-08 DIAGNOSIS — I270 Primary pulmonary hypertension: Secondary | ICD-10-CM

## 2014-02-08 DIAGNOSIS — N183 Chronic kidney disease, stage 3 unspecified: Secondary | ICD-10-CM | POA: Diagnosis not present

## 2014-02-08 DIAGNOSIS — I480 Paroxysmal atrial fibrillation: Secondary | ICD-10-CM | POA: Diagnosis present

## 2014-02-08 DIAGNOSIS — I272 Pulmonary hypertension, unspecified: Secondary | ICD-10-CM | POA: Diagnosis present

## 2014-02-08 DIAGNOSIS — I5033 Acute on chronic diastolic (congestive) heart failure: Secondary | ICD-10-CM

## 2014-02-08 LAB — BASIC METABOLIC PANEL
Anion gap: 12 (ref 5–15)
BUN: 40 mg/dL — AB (ref 6–23)
CO2: 31 meq/L (ref 19–32)
Calcium: 8.8 mg/dL (ref 8.4–10.5)
Chloride: 96 mEq/L (ref 96–112)
Creatinine, Ser: 2.23 mg/dL — ABNORMAL HIGH (ref 0.50–1.10)
GFR calc Af Amer: 23 mL/min — ABNORMAL LOW (ref >90)
GFR calc non Af Amer: 20 mL/min — ABNORMAL LOW (ref >90)
GLUCOSE: 97 mg/dL (ref 70–99)
Potassium: 3.7 mEq/L (ref 3.7–5.3)
SODIUM: 139 meq/L (ref 137–147)

## 2014-02-08 LAB — CBC
HEMATOCRIT: 27.4 % — AB (ref 36.0–46.0)
HEMOGLOBIN: 9 g/dL — AB (ref 12.0–15.0)
MCH: 29.6 pg (ref 26.0–34.0)
MCHC: 32.8 g/dL (ref 30.0–36.0)
MCV: 90.1 fL (ref 78.0–100.0)
Platelets: 177 10*3/uL (ref 150–400)
RBC: 3.04 MIL/uL — AB (ref 3.87–5.11)
RDW: 16 % — ABNORMAL HIGH (ref 11.5–15.5)
WBC: 5.2 10*3/uL (ref 4.0–10.5)

## 2014-02-08 LAB — GLUCOSE, CAPILLARY
GLUCOSE-CAPILLARY: 121 mg/dL — AB (ref 70–99)
GLUCOSE-CAPILLARY: 140 mg/dL — AB (ref 70–99)
Glucose-Capillary: 103 mg/dL — ABNORMAL HIGH (ref 70–99)
Glucose-Capillary: 123 mg/dL — ABNORMAL HIGH (ref 70–99)
Glucose-Capillary: 168 mg/dL — ABNORMAL HIGH (ref 70–99)
Glucose-Capillary: 208 mg/dL — ABNORMAL HIGH (ref 70–99)

## 2014-02-08 LAB — VANCOMYCIN, TROUGH: Vancomycin Tr: 25.2 ug/mL (ref 10.0–20.0)

## 2014-02-08 MED ORDER — HEPARIN (PORCINE) IN NACL 100-0.45 UNIT/ML-% IJ SOLN
1050.0000 [IU]/h | INTRAMUSCULAR | Status: AC
  Administered 2014-02-08 – 2014-02-09 (×2): 1050 [IU]/h via INTRAVENOUS
  Filled 2014-02-08 (×3): qty 250

## 2014-02-08 MED ORDER — VANCOMYCIN HCL IN DEXTROSE 750-5 MG/150ML-% IV SOLN
750.0000 mg | INTRAVENOUS | Status: DC
  Administered 2014-02-09 – 2014-02-11 (×3): 750 mg via INTRAVENOUS
  Filled 2014-02-08 (×3): qty 150

## 2014-02-08 MED ORDER — FUROSEMIDE 10 MG/ML IJ SOLN
40.0000 mg | Freq: Every day | INTRAMUSCULAR | Status: DC
Start: 2014-02-08 — End: 2014-02-08

## 2014-02-08 NOTE — Progress Notes (Signed)
PROGRESS NOTE  [name] KPT:[id number] DOB: [birth year] DOA: 02/15/2014 PCP: Manon Hilding, MD  HPI Patient is a 79 yo female with a PMH of afib, (on warfarin), CVA, PVD, CHF, CKD IV, and pulmonary hypertension presented to the ER from [facility] for AKA due to non healing lower extremity wound.  Subjective:   - continues to remain improved and is pleasant and conversant  Assessment/Plan:  Acute encephalopathy Uncertain etiology, possibly infectious as this resolved with antibiotics. Continue to monitor Patient has received no benzodiazepines and very little narcotics. Continue vancomycin and cefepime   PVD with non healing wound For AKA when stable. If she continues to do well can probably have her surgery on Tuesday  Appreciate Cardiology Consultation for surgical clearance.  Patient is moderate to high risk. Continue vancomycin and cefepime for presumed underlying infection and will keep on antibiotics until surgery   Afib Holding coumadin.  Will ask pharmacy to dose lovenox  Chronic Kidney disease stage IV Baseline creatinine is approximate 2.2. Stable but slowly increasing - continue Lasix change to once daily   Bilateral Pleural Effusions with chronic mild pulmonary edema. - Continue Lasix  - Cardiology following   - ?up 8lbs in the past 24 hours, accurate. Net negative 1L per I&O - will ask for repeat weight - continue Lasix change to once daily  Acute on chronic combined systolic and diastolic heart failure. 2D echo results below. Coreg, isordil.  HTN Well controlled on HTN, Coreg, isordil.  DM II  CBGs well controlled on current insulin regimen. A1C is 6.9.  Goals of care Palliative was consulted and they have met with the patient and the family. Fortunately the patient's mental status recovered to baseline and she expressed her wishes to continue to be full code and to continue with the surgery.  DVT Prophylaxis:  lovenox.  Code Status: Currently  full code.   Family Communication: Dolores Hoose and Louie Casa at bedside. Disposition Plan: Inpatient.   Consultants:  Vascular  Cardiology  Procedures:  2D Echo The estimated ejection fraction was in the range of 45% to 50%. There is akinesis of the mid-apicalanteroseptal myocardium. Features are consistent with a pseudonormal left ventricular filling pattern, with concomitant abnormal relaxation and increased filling pressure (grade 2 diastolic dysfunction).  Antibiotics: Vancomycin 12/3 >> Cefepime 12/3 >>  Objective: Filed Vitals:   02/08/14 0400 02/08/14 0500 02/08/14 0600 02/08/14 0633  BP: 156/60  166/95 166/95  Pulse: 77 79 88   Temp:      TempSrc:      Resp: $Remo'26 16 20   'nsIki$ Height:      Weight:      SpO2: 93% 96% 99%     Intake/Output Summary (Last 24 hours) at 02/08/14 0804 Last data filed at 02/08/14 0600  Gross per 24 hour  Intake    353 ml  Output    700 ml  Net   -347 ml   Filed Weights   02/26/2014 1837 02/07/14 0500 02/08/14 0330  Weight: 77 kg (169 lb 12.1 oz) 84.641 kg (186 lb 9.6 oz) 88.2 kg (194 lb 7.1 oz)   Exam: General: NAD HEENT:   MMM.  Neck: Supple, no JVD, no masses  Cardiovascular: RRR, S1 S2 auscultated, no rubs, murmurs or gallops.   Respiratory: Clear to auscultation bilaterally with equal chest rise  Abdomen: Soft, nontender, slightly distended, + bowel sounds  Extremities: 1-2+ edema, warm.  LLE ulceration wrapped in clean gauze. Neuro: AxOx4, non focal  Data Reviewed: Basic  Metabolic Panel:  Recent Labs Lab 03/03/2014 2325 02/25/2014 0515 02/06/14 0320 02/07/14 0407 02/08/14 0403  NA 141 143 145 141 139  K 4.3 3.9 4.7 4.0 3.7  CL 95* 97 99 97 96  CO2 32 32 31 33* 31  GLUCOSE 181* 89 85 124* 97  BUN 40* 40* 38* 42* 40*  CREATININE 1.91* 1.88* 1.89* 2.02* 2.23*  CALCIUM 9.2 9.3 9.1 8.9 8.8  MG  --  2.0  --   --   --   PHOS  --  4.0  --   --   --    Liver Function Tests:  Recent Labs Lab 03/04/2014 2325 03/02/2014 0515  02/07/14 0407  AST $Re'18 15 16  'YGY$ ALT $R'11 10 11  'fo$ ALKPHOS 76 79 82  BILITOT 0.3 0.3 0.3  PROT 7.2 7.2 6.6  ALBUMIN 2.7* 2.9* 2.5*   CBC:  Recent Labs Lab 02/15/2014 2325 03/02/2014 0515 02/06/14 0320 02/07/14 0407 02/08/14 0403  WBC 6.2 6.2 7.0 5.7 5.2  NEUTROABS 4.3  --   --   --   --   HGB 8.8* 9.2* 8.9* 8.5* 9.0*  HCT 28.7* 29.9* 30.5* 27.6* 27.4*  MCV 88.3 90.3 91.0 91.4 90.1  PLT 207 192 210 174 177  CBG:  Recent Labs Lab 02/07/14 1228 02/07/14 1614 02/07/14 1958 02/08/14 0005 02/08/14 0438  GLUCAP 185* 231* 261* 123* 103*   Recent Results (from the past 240 hour(s))  Surgical pcr screen     Status: None   Collection Time: 02/08/2014 12:17 AM  Result Value Ref Range Status   MRSA, PCR NEGATIVE NEGATIVE Final   Staphylococcus aureus NEGATIVE NEGATIVE Final    Comment:        The Xpert SA Assay (FDA approved for NASAL specimens in patients over 10 years of age), is one component of a comprehensive surveillance program.  Test performance has been validated by [employer] for patients greater than or equal to 72 year old. It is not intended to diagnose infection nor to guide or monitor treatment.    Studies: Ct Head Wo Contrast  02/06/2014   CLINICAL DATA:  Acute encephalopathy  EXAM: CT HEAD WITHOUT CONTRAST  TECHNIQUE: Contiguous axial images were obtained from the base of the skull through the vertex without intravenous contrast.  COMPARISON:  01/28/2014  FINDINGS: No skull fracture is noted. Paranasal sinuses and mastoid air cells are unremarkable. Stable cerebral atrophy. No intracranial hemorrhage, mass effect or midline shift. Again noted periventricular chronic white matter disease. No definite acute cortical infarction. No mass lesion is noted on this unenhanced scan. Atherosclerotic calcifications of carotid siphon.  IMPRESSION: No acute intracranial abnormality. Stable atrophy and chronic white matter disease. No definite acute cortical infarction.    Electronically Signed   By: Lahoma Crocker M.D.   On: 02/06/2014 11:45   Scheduled Meds: . amitriptyline  50 mg Oral QHS  . antiseptic oral rinse  7 mL Mouth Rinse BID  . carvedilol  6.25 mg Oral BID WC  . ceFEPime (MAXIPIME) IV  1 g Intravenous Q24H  . docusate sodium  100 mg Oral BID  . enoxaparin  80 mg Subcutaneous Q24H  . feeding supplement (GLUCERNA SHAKE)  237 mL Oral BID BM  . furosemide  40 mg Intravenous BID  . hydrALAZINE  50 mg Oral 3 times per day  . insulin aspart  0-9 Units Subcutaneous 6 times per day  . iron polysaccharides  150 mg Oral BID WC  . isosorbide dinitrate  40 mg Oral TID WC  . pantoprazole  40 mg Oral Daily  . sodium chloride  3 mL Intravenous Q12H  . sucralfate  1 g Oral TID WC & HS  . vancomycin  1,000 mg Intravenous Q24H   Continuous Infusions:   Principal Problem:   Foot ulcer, left Active Problems:   Chronic atrial fibrillation   Essential hypertension   Diabetes mellitus   Warfarin anticoagulation   Chronic diastolic CHF (congestive heart failure)   PAD (peripheral artery disease)   CKD (chronic kidney disease) stage 3, GFR 30-59 ml/min   Foot ulcer   Palliative care encounter   Encephalopathy   CHF (congestive heart failure)  Time spent: 25 minutes  If 7PM-7AM, please contact night-coverage at [email or web address], password [email or web address] Marzetta Board, MD [facility] [phone]

## 2014-02-08 NOTE — Progress Notes (Signed)
Subjective:   Says she feels great. Denies dyspnea. Oriented well.   Renal function continues to worsen on IV lasix.   Objective: Vital signs in last 24 hours: Temp:  [97.3 F (36.3 C)-98.6 F (37 C)] 98 F (36.7 C) (12/06 0330) Pulse Rate:  [65-92] 88 (12/06 0600) Resp:  [13-26] 20 (12/06 0600) BP: (136-175)/(47-97) 166/95 mmHg (12/06 0633) SpO2:  [92 %-100 %] 99 % (12/06 0600) Weight:  [77.701 kg (171 lb 4.8 oz)-88.2 kg (194 lb 7.1 oz)] 77.701 kg (171 lb 4.8 oz) (12/06 0838) Last BM Date: 02/03/14  Intake/Output from previous day: 12/05 0701 - 12/06 0700 In: 353 [P.O.:100; I.V.:3; IV Piggyback:250] Out: 700 [Urine:700] Intake/Output this shift:    Medications Current Facility-Administered Medications  Medication Dose Route Frequency Provider Last Rate Last Dose  . 0.9 %  sodium chloride infusion  250 mL Intravenous PRN Therisa DoyneAnastassia Doutova, MD      . acetaminophen (TYLENOL) tablet 650 mg  650 mg Oral Q6H PRN Therisa DoyneAnastassia Doutova, MD       Or  . acetaminophen (TYLENOL) suppository 650 mg  650 mg Rectal Q6H PRN Therisa DoyneAnastassia Doutova, MD      . albuterol (PROVENTIL) (2.5 MG/3ML) 0.083% nebulizer solution 2.5 mg  2.5 mg Nebulization Q2H PRN Therisa DoyneAnastassia Doutova, MD      . amitriptyline (ELAVIL) tablet 50 mg  50 mg Oral QHS Therisa DoyneAnastassia Doutova, MD   50 mg at 02/07/14 2213  . antiseptic oral rinse (CPC / CETYLPYRIDINIUM CHLORIDE 0.05%) solution 7 mL  7 mL Mouth Rinse BID Therisa DoyneAnastassia Doutova, MD   7 mL at 02/07/14 2200  . carvedilol (COREG) tablet 6.25 mg  6.25 mg Oral BID WC Therisa DoyneAnastassia Doutova, MD   6.25 mg at 02/07/14 1720  . ceFEPIme (MAXIPIME) 1 g in dextrose 5 % 50 mL IVPB  1 g Intravenous Q24H Crystal ShellmanStillinger Robertson, RPH   1 g at 02/07/14 1743  . docusate sodium (COLACE) capsule 100 mg  100 mg Oral BID Therisa DoyneAnastassia Doutova, MD   100 mg at 02/07/14 2213  . feeding supplement (GLUCERNA SHAKE) (GLUCERNA SHAKE) liquid 237 mL  237 mL Oral BID BM Normand SloopHaley H Lawrence, RD   237 mL at  02/07/14 1240  . furosemide (LASIX) injection 40 mg  40 mg Intravenous Daily Costin Otelia SergeantM Gherghe, MD      . haloperidol lactate (HALDOL) injection 3 mg  3 mg Intramuscular Q6H PRN Roma KayserKatherine P Schorr, NP      . hydrALAZINE (APRESOLINE) tablet 50 mg  50 mg Oral 3 times per day Therisa DoyneAnastassia Doutova, MD   50 mg at 02/08/14 [id number]  . HYDROcodone-acetaminophen (NORCO/VICODIN) 5-325 MG per tablet 1 tablet  1 tablet Oral Q8H PRN Therisa DoyneAnastassia Doutova, MD      . HYDROcodone-acetaminophen (NORCO/VICODIN) 5-325 MG per tablet 1-2 tablet  1-2 tablet Oral Q4H PRN Therisa DoyneAnastassia Doutova, MD      . insulin aspart (novoLOG) injection 0-9 Units  0-9 Units Subcutaneous 6 times per day Therisa DoyneAnastassia Doutova, MD   1 Units at 02/08/14 0011  . ipratropium-albuterol (DUONEB) 0.5-2.5 (3) MG/3ML nebulizer solution 3 mL  3 mL Nebulization Q6H PRN Therisa DoyneAnastassia Doutova, MD      . iron polysaccharides (NIFEREX) capsule 150 mg  150 mg Oral BID WC Therisa DoyneAnastassia Doutova, MD   150 mg at 02/07/14 1728  . isosorbide dinitrate (ISORDIL) tablet 40 mg  40 mg Oral TID WC Therisa DoyneAnastassia Doutova, MD   40 mg at 02/07/14 1720  . ondansetron (ZOFRAN) tablet 4 mg  4 mg Oral Q6H PRN Therisa DoyneAnastassia Doutova, MD       Or  . ondansetron (ZOFRAN) injection 4 mg  4 mg Intravenous Q6H PRN Therisa DoyneAnastassia Doutova, MD   4 mg at 13-Apr-2013 0645  . pantoprazole (PROTONIX) EC tablet 40 mg  40 mg Oral Daily Therisa DoyneAnastassia Doutova, MD   40 mg at 02/07/14 0924  . sodium chloride 0.9 % injection 3 mL  3 mL Intravenous Q12H Therisa DoyneAnastassia Doutova, MD   3 mL at 02/07/14 2222  . sodium chloride 0.9 % injection 3 mL  3 mL Intravenous PRN Therisa DoyneAnastassia Doutova, MD      . sorbitol 70 % solution 30 mL  30 mL Oral Daily PRN Ulice BoldAlicia C Parker, NP      . sucralfate (CARAFATE) 1 GM/10ML suspension 1 g  1 g Oral TID WC & HS Costin Otelia SergeantM Gherghe, MD   1 g at 02/07/14 2213  . vancomycin (VANCOCIN) IVPB 1000 mg/200 mL premix  1,000 mg Intravenous Q24H Leatha Gildingostin M Gherghe, MD   1,000 mg at 02/07/14 1900    PE: General  appearance: alert, cooperative, chronically ill appearing in no distress HEENT normal Neck Supple JVP 9-10 with prominent CV waves Lungs: Decreased BS throughout.  Heart: regular rate and rhythm Abdomen: NT/ND, soft Extremities: 1-2+ LEE edema. Missing several toes on R. L foot wrapped Skin: Warm and dry Neurologic: Grossly normal   Tele: NSR 80s.  Lab Results:   Recent Labs  02/06/14 0320 02/07/14 0407 02/08/14 0403  WBC 7.0 5.7 5.2  HGB 8.9* 8.5* 9.0*  HCT 30.5* 27.6* 27.4*  PLT 210 174 177   BMET  Recent Labs  02/06/14 0320 02/07/14 0407 02/08/14 0403  NA 145 141 139  K 4.7 4.0 3.7  CL 99 97 96  CO2 31 33* 31  GLUCOSE 85 124* 97  BUN 38* 42* 40*  CREATININE 1.89* 2.02* 2.23*  CALCIUM 9.1 8.9 8.8   PT/INR  Recent Labs  02/07/14 0407  LABPROT 21.3*  INR 1.83*   CHEST 2 VIEW  COMPARISON: Chest radiograph performed 01/27/2014  FINDINGS: The lungs are well-aerated. Moderate right and small left pleural effusions are seen, with bibasilar airspace opacification, similar in appearance to the prior study, concerning for persistent mild pulmonary edema. Underlying vascular congestion is seen. No pneumothorax is identified.  The heart is enlarged. No acute osseous abnormalities are seen.  IMPRESSION: Moderate and small left pleural effusions, with bibasilar airspace opacification, similar in appearance to the prior study and concerning for persistent mild pulmonary edema. Cardiomegaly noted.   Assessment/Plan 79 year old female patient of Dr. Myrtis SerKatz with a history of atrial fibrillation, severe peripheral vascular disease, diabetes, diastolic heart failure here with left nonhealing foot ulcer admitted for above knee amputation by Dr. Edilia Boickson of vascular surgery with chronic kidney disease stage III. On chronic anticoagulation for atrial fibrillation, holding Coumadin, vitamin K administered. Prior stroke in 2009.  EF 45-50%, G2DD, akinesis of the  mid-apicalanteroseptal myocardium, peak PA pressure 65    Foot ulcer, left   Chronic atrial fibrillation   Essential hypertension   Diabetes mellitus   Warfarin anticoagulation   Chronic diastolic CHF (congestive heart failure)   PAD (peripheral artery disease)   CKD (chronic kidney disease) stage 3, GFR 30-59 ml/min   Foot ulcer  1. Paroxysmal atrial fibrillation Maintaining sinus rhythm. Holding coumadin for surgical procedure next week; continue carvedilol. Is on lovenox bridge. Given worsening renal function will switch to IV heparin  2. Chronic anticoagulation- Coumadin has been  held, vitamin K has been administered per primary team. Resume postoperatively. Bridging with heparin.   3. Acute on Chronic diastolic heart failure/likely secondary pulmonary hypertension She has peripheral edema and neck veins are up. Original weight today recorded as 194 but I reweighed her personally with nurse techs and she is 171 which is about 5 pounds below her baseline. With renal function worsening will hold diuretics today. Can consider resuming po diuretics as renal function improves. Suspect she has some RHF and we may not be able to diurese her fully.   4.  Acute encephalopathy  Improved.  5.  Acute on Chronic stage 3 renal failure  Hold lasix today. Resume po diuretics when renal function improves.   6.   Foot ulcer  For amputation next week; continue antibiotics.  LOS: 4 days    Holly Bodily

## 2014-02-08 NOTE — Progress Notes (Signed)
Patient is working on her puzzle book and is in no distress, will call when she is ready to go to bed for BIPAP placement.

## 2014-02-08 NOTE — Progress Notes (Signed)
IV nurse here and attempted to place an IV but had no success. She will send another nurse as soon as possible to attempt an IV placement. Pt doesn't tolerate pain well and states she is nauseated now. Pt is resting with daughter-n-law at bedside. Shifted antibiotic until next shift schedule. Will inform night nurse.

## 2014-02-08 NOTE — Progress Notes (Signed)
Patient continues to be awake sitting in chair, patient states her nose is sore and would like to avoid the BIPAP mask tonight if possible. Patient is in no distress at this time and there is a sore across the bridge of her nose, RN made aware

## 2014-02-08 NOTE — Progress Notes (Signed)
[facility]  Subjective  - She is feeling better, sitting up.     Objective 166/95 88 97.5 F (36.4 C) (Oral) 20 99%  Intake/Output Summary (Last 24 hours) at 02/08/14 [id number] Last data filed at 02/08/14 0600  Gross per 24 hour  Intake    350 ml  Output    700 ml  Net   -350 ml    Left foot medial wound with yellow eschar and edema. No change in foot wound per nursing.   Assessment/Planning: DIABETIC FOOT INFECTION WITH INFRAINGUINAL ARTERIAL OCCLUSIVE DISEASE She will require left AKA. Her INR is 1.83, we would like it to be 1.5 or less prior to surgery.  She is Alert and pleasant.  Improved Acute encephalopathy on IV vanc and cefepime.  Afebrile WBC 5.2. Will discuss Left AKA plan with Dr. Edilia Boickson tomorrow and wait for INR to decrease.      Clinton GallantCOLLINS, Alletta Mattos [facility] Surgery LLCMAUREEN 02/08/2014 9:27 AM --  Laboratory Lab Results:  Recent Labs  02/07/14 0407 02/08/14 0403  WBC 5.7 5.2  HGB 8.5* 9.0*  HCT 27.6* 27.4*  PLT 174 177   BMET  Recent Labs  02/07/14 0407 02/08/14 0403  NA 141 139  K 4.0 3.7  CL 97 96  CO2 33* 31  GLUCOSE 124* 97  BUN 42* 40*  CREATININE 2.02* 2.23*  CALCIUM 8.9 8.8    COAG Lab Results  Component Value Date   INR 1.83* 02/07/2014   INR 1.96* 02/13/2014   INR 1.99* 02/21/2014   No results found for: PTT

## 2014-02-08 NOTE — Progress Notes (Signed)
ANTICOAGULATION CONSULT NOTE - Follow Up Consult  Pharmacy Consult for Change LMWH>>>IV Heparin, Vanco/Cefepime Indication: atrial fibrillation  Allergies  Allergen Reactions  . Ativan [Lorazepam] Other (See Comments)    Unknown  . Morphine And Related Other (See Comments)    MAR  . Xanax Weber CooksXr [Alprazolam Er]     Oversedation     Patient Measurements: Height: 5\' 5"  (165.1 cm) Weight: 171 lb 4.8 oz (77.701 kg) IBW/kg (Calculated) : 57 Heparin Dosing Weight: 74 kg  Vital Signs: Temp: 97.5 F (36.4 C) (12/06 0800) Temp Source: Oral (12/06 0800) BP: 166/95 mmHg (12/06 0633) Pulse Rate: 88 (12/06 0600)  Labs:  Recent Labs  02/06/14 0320 02/07/14 0407 02/08/14 0403  HGB 8.9* 8.5* 9.0*  HCT 30.5* 27.6* 27.4*  PLT 210 174 177  LABPROT  --  21.3*  --   INR  --  1.83*  --   CREATININE 1.89* 2.02* 2.23*    Estimated Creatinine Clearance: 21.8 mL/min (by C-G formula based on Cr of 2.23).   Assessment: Patient is a 79 yo female with a PMH of afib on warfarin PTA, CVA, PVD, CHF, CKD IV, and pulmonary hypertension presented to the ER from [facility] for AKA due to gangrenous non healing lower extremity wound.  AC: Afib. Change LMWH>>IV heparin, on warfarin PTA INR 1.83 Warfarin currently on hold. Hgb 9. Plts 177. PTA dose: 4 mg po daily. Start IV heparin this PM when next LMWH would be due.  Infectious Disease: Vanco/Cefepime for Possibly UTI vs HCAP, non-healing LE wound, WBC 5.2, Afeb. No cultures. SCr continue to increase 1.89>>2.02>>2.23 today. Vanco trough Monday. 12/3 Vanc > 12/3 Cefepime>  Cardiovascular: CHF, PVD, HTN, BP 166/95 on Coreg, Hydralazine, isordil  Endocrinology: DM2, cbg 103-261 on SSI only  GI / Nutrition: carb-mod diet, Glucerna, po PPI and sucralfate  Neurology: h/o CVA 2009. Vertigo, peripheral neuropathy. acute encephalopathy, lethargic - on elavil. Much more awake/alert.  Nephrology: Stage 4 CKD,Scr up to 2.23.   Pulmonary: COPD.  bipap  Hematology / Oncology: hgb 9, plts 177 on Niferex  PTA Medication Issues: labetalol, zinc  Best Practices: Lovenox   Goal of Therapy:  Heparin level 0.3-0.7 units/ml  Vanco trough 15-20 Monitor platelets by anticoagulation protocol: Yes   Plan:  D/c Lovenox At 1600, begin IV heparin at 1050 Units/hr and check level 6 hrs late Daily heparin level and CBC Vancomycin trough tonight on 1g IV q12h and worsening renal function Cefepime 1g IV q24h.     Annasofia Pohl S. Merilynn Finlandobertson, PharmD, [facility] Clinical Staff Pharmacist Pager [phone]  Misty Stanleyobertson, Maylyn Narvaiz Stillinger 02/08/2014,11:00 AM

## 2014-02-08 NOTE — Progress Notes (Signed)
ANTIBIOTIC CONSULT NOTE - FOLLOW UP  Pharmacy Consult for vancomycin and cefepime Indication: UTI, HCAP  Allergies  Allergen Reactions  . Ativan [Lorazepam] Other (See Comments)    Unknown  . Morphine And Related Other (See Comments)    MAR  . Xanax Weber CooksXr [Alprazolam Er]     Oversedation     Patient Measurements: Height: 5\' 5"  (165.1 cm) Weight: 171 lb 4.8 oz (77.701 kg) IBW/kg (Calculated) : 57   Vital Signs: Temp: 97.1 F (36.2 C) (12/06 1545) Temp Source: Axillary (12/06 1545) BP: 156/131 mmHg (12/06 1545) Pulse Rate: 89 (12/06 1545) Intake/Output from previous day: 12/05 0701 - 12/06 0700 In: 353 [P.O.:100; I.V.:3; IV Piggyback:250] Out: 700 [Urine:700] Intake/Output from this shift: Total I/O In: 3 [I.V.:3] Out: 200 [Urine:200]  Labs:  Recent Labs  02/06/14 0320 02/07/14 0407 02/08/14 0403  WBC 7.0 5.7 5.2  HGB 8.9* 8.5* 9.0*  PLT 210 174 177  CREATININE 1.89* 2.02* 2.23*   Estimated Creatinine Clearance: 21.8 mL/min (by C-G formula based on Cr of 2.23).  Recent Labs  02/08/14 1652  VANCOTROUGH 25.2*     Microbiology: Recent Results (from the past 720 hour(s))  Surgical pcr screen     Status: None   Collection Time: 02/09/2014 12:17 AM  Result Value Ref Range Status   MRSA, PCR NEGATIVE NEGATIVE Final   Staphylococcus aureus NEGATIVE NEGATIVE Final    Comment:        The Xpert SA Assay (FDA approved for NASAL specimens in patients over 77 years of age), is one component of a comprehensive surveillance program.  Test performance has been validated by [facility] for patients greater than or equal to 77 year old. It is not intended to diagnose infection nor to guide or monitor treatment.     Anti-infectives    Start     Dose/Rate Route Frequency Ordered Stop   02/07/14 1800  ceFEPIme (MAXIPIME) 1 g in dextrose 5 % 50 mL IVPB     1 g100 mL/hr over 30 Minutes Intravenous Every 24 hours 02/07/14 1209     02/17/2014 1900  vancomycin  (VANCOCIN) IVPB 1000 mg/200 mL premix     1,000 mg200 mL/hr over 60 Minutes Intravenous Every 24 hours 02/18/2014 1701     02/19/2014 1800  ceFEPIme (MAXIPIME) 2 g in dextrose 5 % 50 mL IVPB  Status:  Discontinued     2 g100 mL/hr over 30 Minutes Intravenous Every 24 hours 02/22/2014 1700 02/07/14 1208      Assessment: 79 YOF with UTI, HCAP, non-healing leg wound on vancomycin and cefepime. Renal function has been worsening- currently up to 2.23 with est CrCl ~4622mL/min. A trough drawn this evening is elevated at 25.872mcg/mL on vancomycin 1000mg  IV q24h WBC 5.2, afebrile. No cultures drawn.  Goal of Therapy:  Vancomycin trough level 15-20 mcg/ml  Plan:  1. Decrease vancomycin to 750mg  IV q24- will start tomorrow morning to allow tonight's trough to trend down before redosing 2. Continue cefepime 1g IV q24h 3. Follow clinical progression, renal function, trough at new SS  Braeden Dolinski D. Hoyte Ziebell, PharmD, BCPS Clinical Pharmacist Pager: [phone] 02/08/2014 6:33 PM

## 2014-02-08 NOTE — Progress Notes (Signed)
MD started a heparin drip on pt which is running through iv to right AC. Pt has antibiotic due at this time and nurse looked up heparin and maxipime and they are not compatible. Pt is a hard stick and veins blow easy. Called IV nurse to come.

## 2014-02-09 DIAGNOSIS — N179 Acute kidney failure, unspecified: Secondary | ICD-10-CM

## 2014-02-09 LAB — CBC
HCT: 26.5 % — ABNORMAL LOW (ref 36.0–46.0)
HEMATOCRIT: 28.7 % — AB (ref 36.0–46.0)
Hemoglobin: 8.2 g/dL — ABNORMAL LOW (ref 12.0–15.0)
Hemoglobin: 8.8 g/dL — ABNORMAL LOW (ref 12.0–15.0)
MCH: 26.9 pg (ref 26.0–34.0)
MCH: 27.1 pg (ref 26.0–34.0)
MCHC: 30.9 g/dL (ref 30.0–36.0)
MCHC: 30.7 g/dL (ref 30.0–36.0)
MCV: 87.5 fL (ref 78.0–100.0)
MCV: 87.8 fL (ref 78.0–100.0)
PLATELETS: 184 10*3/uL (ref 150–400)
Platelets: 174 10*3/uL (ref 150–400)
RBC: 3.03 MIL/uL — ABNORMAL LOW (ref 3.87–5.11)
RBC: 3.27 MIL/uL — ABNORMAL LOW (ref 3.87–5.11)
RDW: 16 % — AB (ref 11.5–15.5)
RDW: 15.9 % — AB (ref 11.5–15.5)
WBC: 5.4 10*3/uL (ref 4.0–10.5)
WBC: 5.6 10*3/uL (ref 4.0–10.5)

## 2014-02-09 LAB — BASIC METABOLIC PANEL
Anion gap: 13 (ref 5–15)
BUN: 40 mg/dL — ABNORMAL HIGH (ref 6–23)
CALCIUM: 8.5 mg/dL (ref 8.4–10.5)
CO2: 30 mEq/L (ref 19–32)
Chloride: 94 mEq/L — ABNORMAL LOW (ref 96–112)
Creatinine, Ser: 2.1 mg/dL — ABNORMAL HIGH (ref 0.50–1.10)
GFR, EST AFRICAN AMERICAN: 25 mL/min — AB (ref >90)
GFR, EST NON AFRICAN AMERICAN: 22 mL/min — AB (ref >90)
Glucose, Bld: 199 mg/dL — ABNORMAL HIGH (ref 70–99)
POTASSIUM: 3.6 meq/L — AB (ref 3.7–5.3)
SODIUM: 137 meq/L (ref 137–147)

## 2014-02-09 LAB — PROTIME-INR
INR: 1.24 (ref 0.00–1.49)
Prothrombin Time: 15.7 seconds — ABNORMAL HIGH (ref 11.6–15.2)

## 2014-02-09 LAB — GLUCOSE, CAPILLARY
GLUCOSE-CAPILLARY: 149 mg/dL — AB (ref 70–99)
GLUCOSE-CAPILLARY: 164 mg/dL — AB (ref 70–99)
Glucose-Capillary: 167 mg/dL — ABNORMAL HIGH (ref 70–99)
Glucose-Capillary: 207 mg/dL — ABNORMAL HIGH (ref 70–99)
Glucose-Capillary: 272 mg/dL — ABNORMAL HIGH (ref 70–99)

## 2014-02-09 LAB — HEPARIN LEVEL (UNFRACTIONATED)
HEPARIN UNFRACTIONATED: 0.5 [IU]/mL (ref 0.30–0.70)
Heparin Unfractionated: 0.37 IU/mL (ref 0.30–0.70)

## 2014-02-09 NOTE — Progress Notes (Signed)
PROGRESS NOTE  [name] YZJ:[id number] DOB: Apr 05, 1934 DOA: 02/25/2014 PCP: Manon Hilding, MD  HPI Patient is a 79 yo female with a PMH of afib, (on warfarin), CVA, PVD, CHF, CKD IV, and pulmonary hypertension presented to the ER from [facility] for AKA due to non healing lower extremity wound.  Subjective:   - AxOx4, looking back to normal   Assessment/Plan:  Acute encephalopathy Uncertain etiology, possibly infectious as this resolved with antibiotics. Continue to monitor Patient has received no benzodiazepines and very little narcotics. Continue vancomycin and cefepime  Resolved, back to baseline  PVD with non healing wound For AKA when stable. If she continues to do well can probably have her surgery tomorrow.  [facility] Consultation for surgical clearance.  Patient is moderate to high risk. Continue vancomycin and cefepime for presumed underlying infection and will keep on antibiotics until surgery   Afib Holding coumadin.  On heparin gtt  Chronic Kidney disease stage IV Baseline creatinine is approximate 2.2. Stable but slowly increasing Lasix on hold yesterday per cards  Bilateral Pleural Effusions with chronic mild pulmonary edema. - Continue Lasix  - Cardiology following   - weights charted NOT accurate  Acute on chronic combined systolic and diastolic heart failure. 2D echo results below. Coreg, isordil.  HTN Well controlled on HTN, Coreg, isordil.  DM II  CBGs well controlled on current insulin regimen. A1C is 6.9.  Goals of care Palliative was consulted and they have met with the patient and the family. Fortunately the patient's mental status recovered to baseline and she expressed her wishes to continue to be full code and to continue with the surgery.  DVT Prophylaxis:  lovenox.   Code Status: Currently full code.   Family Communication: none this morning Disposition Plan:  Inpatient.   Consultants:  Vascular  Cardiology  Procedures:  2D Echo The estimated ejection fraction was in the range of 45% to 50%. There is akinesis of the mid-apicalanteroseptal myocardium. Features are consistent with a pseudonormal left ventricular filling pattern, with concomitant abnormal relaxation and increased filling pressure (grade 2 diastolic dysfunction).  Antibiotics: Vancomycin 12/3 >> Cefepime 12/3 >>  Objective: Filed Vitals:   02/08/14 2357 02/09/14 0001 02/09/14 0405 02/09/14 0754  BP: 172/63  163/60 183/65  Pulse: 82  76 84  Temp: 97.6 F (36.4 C)  97.6 F (36.4 C) 97.3 F (36.3 C)  TempSrc: Oral  Oral Oral  Resp: _0 Height:  _1  (1.676 m)    Weight:  84.2 kg (185 lb 10 oz)    SpO2:   98% 98%    Intake/Output Summary (Last 24 hours) at 02/09/14 0803 Last data filed at 02/09/14 0400  Gross per 24 hour  Intake 526.25 ml  Output    200 ml  Net 326.25 ml   Filed Weights   02/08/14 0330 02/08/14 0838 02/09/14 0001  Weight: 88.2 kg (194 lb 7.1 oz) 77.701 kg (171 lb 4.8 oz) 84.2 kg (185 lb 10 oz)   Exam: General: NAD HEENT:   MMM.  Neck: Supple, no JVD, no masses  Cardiovascular: RRR, S1 S2 auscultated, no rubs, murmurs or gallops.   Respiratory: Clear to auscultation bilaterally with equal chest rise  Abdomen: Soft, nontender, slightly distended, + bowel sounds  Extremities: 1-2+ edema, warm.  LLE ulceration wrapped in clean gauze. Neuro: AxOx4, non focal  Data Reviewed: Basic Metabolic Panel:  Recent Labs Lab 03/02/2014 0515 02/06/14 0320 02/07/14 0407 02/08/14 0403 02/09/14 0017  NA 143 145 141 139 137  K 3.9 4.7 4.0 3.7 3.6*  CL 97 99 97 96 94*  CO2 32 31 33* 31 30  GLUCOSE 89 85 124* 97 199*  BUN 40* 38* 42* 40* 40*  CREATININE 1.88* 1.89* 2.02* 2.23* 2.10*  CALCIUM 9.3 9.1 8.9 8.8 8.5  MG 2.0  --   --   --   --   PHOS 4.0  --   --   --   --    Liver Function Tests:  Recent Labs Lab 02/23/2014 2325  02/26/2014 0515 02/07/14 0407  AST _0 ALT _1 ALKPHOS 76 79 82  BILITOT 0.3 0.3 0.3  PROT 7.2 7.2 6.6  ALBUMIN 2.7* 2.9* 2.5*   CBC:  Recent Labs Lab 03/03/2014 2325 02/27/2014 0515 02/06/14 0320 02/07/14 0407 02/08/14 0403 02/09/14 0017  WBC 6.2 6.2 7.0 5.7 5.2 5.6  NEUTROABS 4.3  --   --   --   --   --   HGB 8.8* 9.2* 8.9* 8.5* 9.0* 8.2*  HCT 28.7* 29.9* 30.5* 27.6* 27.4* 26.5*  MCV 88.3 90.3 91.0 91.4 90.1 87.5  PLT 207 192 210 174 177 174  CBG:  Recent Labs Lab 02/08/14 1539 02/08/14 2026 02/08/14 2356 02/09/14 0407 02/09/14 0753  GLUCAP 140* 208* 207* 149* 167*   Recent Results (from the past 240 hour(s))  Surgical pcr screen     Status: None   Collection Time: 03/05/2014 12:17 AM  Result Value Ref Range Status   MRSA, PCR NEGATIVE NEGATIVE Final   Staphylococcus aureus NEGATIVE NEGATIVE Final    Comment:        The Xpert SA Assay (FDA approved for NASAL specimens in patients over 38 years of age), is one component of a comprehensive surveillance program.  Test performance has been validated by [employer] for patients greater than or equal to 11 year old. It is not intended to diagnose infection nor to guide or monitor treatment.    Studies: No results found. Scheduled Meds: . amitriptyline  50 mg Oral QHS  . antiseptic oral rinse  7 mL Mouth Rinse BID  . carvedilol  6.25 mg Oral BID WC  . ceFEPime (MAXIPIME) IV  1 g Intravenous Q24H  . docusate sodium  100 mg Oral BID  . feeding supplement (GLUCERNA SHAKE)  237 mL Oral BID BM  . hydrALAZINE  50 mg Oral 3 times per day  . insulin aspart  0-9 Units Subcutaneous 6 times per day  . iron polysaccharides  150 mg Oral BID WC  . isosorbide dinitrate  40 mg Oral TID WC  . pantoprazole  40 mg Oral Daily  . sodium chloride  3 mL Intravenous Q12H  . sucralfate  1 g Oral TID WC & HS  . vancomycin  750 mg Intravenous Q24H   Continuous Infusions: . heparin 1,050 Units/hr (02/09/14 0400)     Principal Problem:   Foot ulcer, left Active Problems:   Chronic atrial fibrillation   Essential hypertension   Diabetes mellitus   Warfarin anticoagulation   Chronic diastolic CHF (congestive heart failure)   PAD (peripheral artery disease)   CKD (chronic kidney disease) stage 3, GFR 30-59 ml/min   Foot ulcer   Palliative care encounter   Encephalopathy   CHF (congestive heart failure)   Acute renal failure superimposed on stage 3 chronic kidney disease   PAF (paroxysmal atrial fibrillation)   Pulmonary HTN  Time spent: 15 minutes  If 7PM-7AM, please contact night-coverage at [email or web address], password [email or web address] Marzetta Board, MD [facility] [phone]

## 2014-02-09 NOTE — Progress Notes (Signed)
[facility]  Subjective: [name] is sitting up in chair with no complaints this morning. She and family tell me that the weekend went well with no further complications at this time. They continue a plan for surgery that is scheduled for tomorrow.   Ms. Kelli SextonFerguson's daughter-in-law shares that Ms. Kukla's brother has passed over the weekend suddenly : [name] does NOT know this and they are waiting until after surgery to tell her. Family is under much stress and are anxious to get this surgery done.     Objective: Allergies  Allergen Reactions  . Ativan [Lorazepam] Other (See Comments)    Unknown  . Morphine And Related Other (See Comments)    MAR  . Xanax Xr [Alprazolam Er]     Oversedation    Scheduled Meds: . amitriptyline  50 mg Oral QHS  . antiseptic oral rinse  7 mL Mouth Rinse BID  . carvedilol  6.25 mg Oral BID WC  . ceFEPime (MAXIPIME) IV  1 g Intravenous Q24H  . docusate sodium  100 mg Oral BID  . feeding supplement (GLUCERNA SHAKE)  237 mL Oral BID BM  . hydrALAZINE  50 mg Oral 3 times per day  . insulin aspart  0-9 Units Subcutaneous 6 times per day  . iron polysaccharides  150 mg Oral BID WC  . isosorbide dinitrate  40 mg Oral TID WC  . pantoprazole  40 mg Oral Daily  . sodium chloride  3 mL Intravenous Q12H  . sucralfate  1 g Oral TID WC & HS  . vancomycin  750 mg Intravenous Q24H   Continuous Infusions: . heparin 1,050 Units/hr (02/09/14 0400)   PRN Meds:.sodium chloride, acetaminophen **OR** acetaminophen, haloperidol lactate, HYDROcodone-acetaminophen, ipratropium-albuterol, ondansetron **OR** ondansetron (ZOFRAN) IV, sodium chloride, sorbitol  BP 183/65 mmHg  Pulse 84  Temp(Src) 97.3 F (36.3 C) (Oral)  Resp 15  Ht 5\' 6"  (1.676 m)  Wt 84.2 kg (185 lb 10 oz)  BMI 29.98 kg/m2  SpO2 98%   PPS: 30%   Intake/Output Summary (Last 24 hours) at 02/09/14 1024 Last data filed at 02/09/14  0400  Gross per 24 hour  Intake 473.25 ml  Output    200 ml  Net 273.25 ml      LBM: 02/09/14  Physical Exam:  General: NAD, sitting in chair HEENT: [place]/AT, no JVD, moist mucous membranes Chest: No labored breathing, symmetric CVS: RRR, S1 S2 Abdomen: Soft, NT, ND Ext: MAE, no edema, warm to touch Neuro: Awake, alert, oriented x 3   Labs: CBC    Component Value Date/Time   WBC 5.4 02/09/2014 0812   RBC 3.27* 02/09/2014 0812   HGB 8.8* 02/09/2014 0812   HCT 28.7* 02/09/2014 0812   PLT 184 02/09/2014 0812   MCV 87.8 02/09/2014 0812   MCH 26.9 02/09/2014 0812   MCHC 30.7 02/09/2014 0812   RDW 15.9* 02/09/2014 0812   LYMPHSABS 1.0 02/13/2014 2325   MONOABS 0.7 03/01/2014 2325   EOSABS 0.2 03/02/2014 2325   BASOSABS 0.0 02/10/2014 2325    BMET    Component Value Date/Time   NA 137 02/09/2014 0017   K 3.6* 02/09/2014 0017   CL 94* 02/09/2014 0017   CO2 30 02/09/2014 0017   GLUCOSE 199* 02/09/2014 0017   BUN 40* 02/09/2014 0017   CREATININE 2.10* 02/09/2014 0017   CALCIUM 8.5 02/09/2014 0017   GFRNONAA 22* 02/09/2014 0017   GFRAA 25* 02/09/2014 0017  CMP     Component Value Date/Time   NA 137 02/09/2014 0017   K 3.6* 02/09/2014 0017   CL 94* 02/09/2014 0017   CO2 30 02/09/2014 0017   GLUCOSE 199* 02/09/2014 0017   BUN 40* 02/09/2014 0017   CREATININE 2.10* 02/09/2014 0017   CALCIUM 8.5 02/09/2014 0017   PROT 6.6 02/07/2014 0407   ALBUMIN 2.5* 02/07/2014 0407   AST 16 02/07/2014 0407   ALT 11 02/07/2014 0407   ALKPHOS 82 02/07/2014 0407   BILITOT 0.3 02/07/2014 0407   GFRNONAA 22* 02/09/2014 0017   GFRAA 25* 02/09/2014 0017   Assessment and Plan: 1. Code Status: FULL 2. Symptom Control: 1. Pain: Denies.  2. Constipation: Colace BID. Sorbitol daily prn.  3. Encephalopathy: Continue BiPAP as indicated. Avoid sedating medications.  3. Psycho/Social: Emotional support provided to patient and family.  4. Disposition: To be determined.   Patient  Documents Completed or Given: Document Given Completed  Advanced Directives Pkt yes   MOST    DNR    Gone from My Sight    Hard Choices      Time In Time Out Total Time Spent with Patient Total Overall Time  1000 1020 15min 20min    Greater than 50%  of this time was spent counseling and coordinating care related to the above assessment and plan.  Yong ChannelAlicia Shaquile Lutze, NP Palliative Medicine Team Pager # [phone] (M-F 8a-5p) Team Phone # [phone] (Nights/Weekends)   1

## 2014-02-09 NOTE — Progress Notes (Signed)
   VASCULAR SURGERY ASSESSMENT & PLAN:  *  The patient is scheduled for a left above-the-knee amputation tomorrow. I have discussed the procedure and potential complications with her and she is agreeable to proceed.  * I will have the pharmacy stop her heparin tomorrow before surgery.    SUBJECTIVE: The patient is much more alert. She has no specific complaints.  PHYSICAL EXAM: Filed Vitals:   02/08/14 2357 02/09/14 0001 02/09/14 0405 02/09/14 0754  BP: 172/63  163/60 183/65  Pulse: 82  76 84  Temp: 97.6 F (36.4 C)  97.6 F (36.4 C) 97.3 F (36.3 C)  TempSrc: Oral  Oral Oral  Resp: 17  14 15   Height:  5\' 6"  (1.676 m)    Weight:  185 lb 10 oz (84.2 kg)    SpO2:   98% 98%   Dressing on left foot is dry with minimal drainage.  LABS: Lab Results  Component Value Date   WBC 5.4 02/09/2014   HGB 8.8* 02/09/2014   HCT 28.7* 02/09/2014   MCV 87.8 02/09/2014   PLT 184 02/09/2014   Lab Results  Component Value Date   CREATININE 2.10* 02/09/2014   Lab Results  Component Value Date   INR 1.24 02/09/2014   CBG (last 3)   Recent Labs  02/08/14 2356 02/09/14 0407 02/09/14 0753  GLUCAP 207* 149* 167*    Principal Problem:   Foot ulcer, left Active Problems:   Chronic atrial fibrillation   Essential hypertension   Diabetes mellitus   Warfarin anticoagulation   Chronic diastolic CHF (congestive heart failure)   PAD (peripheral artery disease)   CKD (chronic kidney disease) stage 3, GFR 30-59 ml/min   Foot ulcer   Palliative care encounter   Encephalopathy   CHF (congestive heart failure)   Acute renal failure superimposed on stage 3 chronic kidney disease   PAF (paroxysmal atrial fibrillation)   Pulmonary HTN   Cari CarawayChris Tamika Nou Beeper: [phone] 02/09/2014

## 2014-02-09 NOTE — Plan of Care (Signed)
Problem: Consults Goal: General Surgical Patient Education (See Patient Education module for education specifics) Outcome: Completed/Met Date Met:  02/09/14     

## 2014-02-09 NOTE — Progress Notes (Signed)
Pt still could not get the hang of using incentive spiromertry changed to Acapella Flutter valve used x 10 with instructions

## 2014-02-09 NOTE — Progress Notes (Signed)
   02/09/14 1300  Clinical Encounter Type  Visited With Patient and family together  Visit Type Follow-up  Referral From Family  Advance Directives (For Healthcare)  Does patient have an advance directive? Yes   Chaplain visited with patient at around 11:00 AM this morning. Patient was being visited by her daughter-in-law. Patient and patient's daughter-in-law wanted to complete an advanced directive. Chaplain helped answer questions about an advanced directive with the patient and her daughter-in-law. After consultation, patient decided to only do a healthcare power of attorney form. Document has been completed, signed, and a copy placed in patient's chart. Patient's daughter-in-law shared with patient that a sibling of the patient recently passed away and they waiting until patient's surgery to break the news. Patient is also heading into surgery in the near future. Chaplain will recommend a follow up visit to the unit's chaplain.  Serin Thornell, Tommi EmeryBlake R, Chaplain  1:49 PM

## 2014-02-09 NOTE — Progress Notes (Signed)
Patient Name: [name] Date of Encounter: 02/09/2014  Primary cardiologist: Dr. Myrtis SerKatz   Principal Problem:   Foot ulcer, left Active Problems:   Chronic atrial fibrillation   Essential hypertension   Diabetes mellitus   Warfarin anticoagulation   Chronic diastolic CHF (congestive heart failure)   PAD (peripheral artery disease)   CKD (chronic kidney disease) stage 3, GFR 30-59 ml/min   Foot ulcer   Palliative care encounter   Encephalopathy   CHF (congestive heart failure)   Acute renal failure superimposed on stage 3 chronic kidney disease   PAF (paroxysmal atrial fibrillation)   Pulmonary HTN    SUBJECTIVE  Denies any SOB and CP. Daughter at bedside.   CURRENT MEDS . amitriptyline  50 mg Oral QHS  . antiseptic oral rinse  7 mL Mouth Rinse BID  . carvedilol  6.25 mg Oral BID WC  . ceFEPime (MAXIPIME) IV  1 g Intravenous Q24H  . docusate sodium  100 mg Oral BID  . feeding supplement (GLUCERNA SHAKE)  237 mL Oral BID BM  . hydrALAZINE  50 mg Oral 3 times per day  . insulin aspart  0-9 Units Subcutaneous 6 times per day  . iron polysaccharides  150 mg Oral BID WC  . isosorbide dinitrate  40 mg Oral TID WC  . pantoprazole  40 mg Oral Daily  . sodium chloride  3 mL Intravenous Q12H  . sucralfate  1 g Oral TID WC & HS  . vancomycin  750 mg Intravenous Q24H    OBJECTIVE  Filed Vitals:   02/08/14 2357 02/09/14 0001 02/09/14 0405 02/09/14 0754  BP: 172/63  163/60 183/65  Pulse: 82  76 84  Temp: 97.6 F (36.4 C)  97.6 F (36.4 C) 97.3 F (36.3 C)  TempSrc: Oral  Oral Oral  Resp: 17  14 15   Height:  5\' 6"  (1.676 m)    Weight:  185 lb 10 oz (84.2 kg)    SpO2:   98% 98%    Intake/Output Summary (Last 24 hours) at 02/09/14 1021 Last data filed at 02/09/14 0400  Gross per 24 hour  Intake 473.25 ml  Output    200 ml  Net 273.25 ml   Filed Weights   02/08/14 0330 02/08/14 0838 02/09/14 0001  Weight: 194 lb 7.1 oz (88.2 kg) 171 lb 4.8 oz (77.701 kg) 185  lb 10 oz (84.2 kg)    PHYSICAL EXAM  General: Pleasant, NAD. Neuro: Alert and oriented X 3. Moves all extremities spontaneously. Psych: Normal affect. HEENT:  Normal  Neck: Supple without bruits +JVD Lungs:  Resp regular and unlabored, decreased breath sound bilaterally, especially the bases Heart: RRR no s3, s4, or murmurs. Abdomen: Soft, non-tender, non-distended, BS + x 4.  Extremities: No clubbing, cyanosis. DP/PT/Radials 2+ and equal bilaterally. 2-3+ pitting edema bilaterally  Accessory Clinical Findings  CBC  Recent Labs  02/09/14 0017 02/09/14 0812  WBC 5.6 5.4  HGB 8.2* 8.8*  HCT 26.5* 28.7*  MCV 87.5 87.8  PLT 174 184   Basic Metabolic Panel  Recent Labs  02/08/14 0403 02/09/14 0017  NA 139 137  K 3.7 3.6*  CL 96 94*  CO2 31 30  GLUCOSE 97 199*  BUN 40* 40*  CREATININE 2.23* 2.10*  CALCIUM 8.8 8.5   Liver Function Tests  Recent Labs  02/07/14 0407  AST 16  ALT 11  ALKPHOS 82  BILITOT 0.3  PROT 6.6  ALBUMIN 2.5*    TELE NSR  with HR 70-80s    ECG  No new EKG  Echocardiogram 01/27/2014  LV EF: 45% -  50%  ------------------------------------------------------------------- Indications:   CHF - 428.0.  ------------------------------------------------------------------- History:  PMH: Acute encephalopathy. CVA. Chronic kidney disease. Risk factors: Hypertension. Diabetes mellitus. Dyslipidemia.  ------------------------------------------------------------------- Study Conclusions  - Left ventricle: The cavity size was normal. There was moderate focal basal and mild concentric hypertrophy of the septum. Systolic function was mildly reduced. The estimated ejection fraction was in the range of 45% to 50%. There is akinesis of the mid-apicalanteroseptal myocardium. Features are consistent with a pseudonormal left ventricular filling pattern, with concomitant abnormal relaxation and increased filling pressure  (grade 2 diastolic dysfunction). - Mitral valve: Calcified annulus. There was mild regurgitation. - Left atrium: The atrium was mildly dilated. - Right ventricle: The cavity size was mildly dilated. Wall thickness was normal. - Right atrium: The atrium was mildly dilated. - Tricuspid valve: There was moderate regurgitation. - Pulmonary arteries: Systolic pressure was moderately to severely increased. PA peak pressure: 65 mm Hg (S). - Pericardium, extracardiac: A trivial pericardial effusion was identified posterior to the heart. There was a moderate-sized left pleural effusion.     Radiology/Studies  Dg Chest 2 View  02/23/2014   CLINICAL DATA:  Preoperative chest radiograph for amputation above the knee. Cough and altered mental status. Initial encounter.  EXAM: CHEST  2 VIEW  COMPARISON:  Chest radiograph performed 01/27/2014  FINDINGS: The lungs are well-aerated. Moderate right and small left pleural effusions are seen, with bibasilar airspace opacification, similar in appearance to the prior study, concerning for persistent mild pulmonary edema. Underlying vascular congestion is seen. No pneumothorax is identified.  The heart is enlarged.  No acute osseous abnormalities are seen.  IMPRESSION: Moderate and small left pleural effusions, with bibasilar airspace opacification, similar in appearance to the prior study and concerning for persistent mild pulmonary edema. Cardiomegaly noted.   Electronically Signed   By: Roanna Raider M.D.   On: 02/25/2014 03:50   Ct Head Wo Contrast  02/06/2014   CLINICAL DATA:  Acute encephalopathy  EXAM: CT HEAD WITHOUT CONTRAST  TECHNIQUE: Contiguous axial images were obtained from the base of the skull through the vertex without intravenous contrast.  COMPARISON:  01/28/2014  FINDINGS: No skull fracture is noted. Paranasal sinuses and mastoid air cells are unremarkable. Stable cerebral atrophy. No intracranial hemorrhage, mass effect or midline  shift. Again noted periventricular chronic white matter disease. No definite acute cortical infarction. No mass lesion is noted on this unenhanced scan. Atherosclerotic calcifications of carotid siphon.  IMPRESSION: No acute intracranial abnormality. Stable atrophy and chronic white matter disease. No definite acute cortical infarction.   Electronically Signed   By: Natasha Mead M.D.   On: 02/06/2014 11:45   Ct Head Wo Contrast  01/28/2014   CLINICAL DATA:  CVA  EXAM: CT HEAD WITHOUT CONTRAST  TECHNIQUE: Contiguous axial images were obtained from the base of the skull through the vertex without intravenous contrast.  COMPARISON:  01/11/2014  FINDINGS: No skull fracture is noted. Paranasal sinuses and mastoid air cells are unremarkable. No intracranial hemorrhage, mass effect or midline shift. Stable mild cerebral atrophy. Stable periventricular chronic white matter disease. No acute cortical infarction. No mass lesion is noted on this unenhanced scan.  IMPRESSION: No acute intracranial abnormality. Mild cerebral atrophy. Stable periventricular chronic mild white matter disease.   Electronically Signed   By: Natasha Mead M.D.   On: 01/28/2014 14:57   Dg Chest Port 1  View  01/27/2014   CLINICAL DATA:  Shortness of breath.  EXAM: PORTABLE CHEST - 1 VIEW  COMPARISON:  01/14/2014  FINDINGS: Mild cardiomegaly. Bilateral airspace opacities likely reflects mild edema. There are bilateral pleural effusions, small on the left and moderate on the right. The right effusion is increased since prior study. Probable bibasilar atelectasis.  IMPRESSION: Mild CHF. Small left effusion and enlarging moderate right effusion with bibasilar atelectasis.   Electronically Signed   By: Charlett NoseKevin  Dover M.D.   On: 01/27/2014 10:15    ASSESSMENT AND PLAN  79 year old female patient of Dr. Myrtis SerKatz with a history of atrial fibrillation, severe peripheral vascular disease, diabetes, diastolic heart failure here with left nonhealing foot ulcer  admitted for above knee amputation by Dr. Edilia Boickson of vascular surgery with chronic kidney disease stage III. On chronic anticoagulation for atrial fibrillation, holding Coumadin, vitamin K administered. Prior stroke in 2009. EF 45-50%, G2DD, akinesis of the mid-apicalanteroseptal myocardium, peak PA pressure 65   Foot ulcer, left  Chronic atrial fibrillation  Essential hypertension  Diabetes mellitus  Warfarin anticoagulation  Chronic diastolic CHF (congestive heart failure)  PAD (peripheral artery disease)  CKD (chronic kidney disease) stage 3, GFR 30-59 ml/min  Foot ulcer  1. Paroxysmal atrial fibrillation  - maintaining NSR. Coumadin reversed with Vit K. Continue IV heparin  2. Chronic anticoagulation- Coumadin has been held, vitamin K has been administered per primary team. Resume postoperatively. Bridging with heparin.   3. Acute on Chronic diastolic heart failure/likely secondary pulmonary hypertension - renal function worsened with IV lasix - previously documented weight was inaccurate, seen by Dr Gala RomneyBensimhon yesterday, weight appears to be 171. IV lasix discontinued. Plan to resume PO lasix once renal function improve.  - continue to have LE pitting edema and JVD, per Dr. Gala RomneyBensimhon, may be related to RHF. Plan for hold PO lasix today, and restart 40mg  BID PO lasix tomorrow after surgery.   4.Acute encephalopathy Improved.  5. Acute on Chronic stage 3 renal failure plan on resume PO lasix once renal function improve  6. Foot ulcer  - plan for L AKA tomorrow.   7. Anemia  Signed, Amedeo PlentyMeng, Hao PA-C Pager: [phone]   Patient seen and examined with Azalee CourseHao Meng, PA-C. We discussed all aspects of the encounter. I agree with the assessment and plan as stated above.   Renal function improved with holding lasix. Weight inaccurate because it was a bed weight. Still has some mild edema but renal function limiting diuresis. Will restart lasix after  surgery tomorrow. Will get standing weight today. Continue heparin bridge. Resume coumadin post-op.   Dajah Fischman,MD 12:03 PM

## 2014-02-09 NOTE — Progress Notes (Addendum)
ANTICOAGULATION CONSULT NOTE - Follow Up Consult  Pharmacy Consult for Heparin Indication: atrial fibrillation  Allergies  Allergen Reactions  . Ativan [Lorazepam] Other (See Comments)    Unknown  . Morphine And Related Other (See Comments)    MAR  . Xanax Weber CooksXr [Alprazolam Er]     Oversedation     Labs:  Recent Labs  02/07/14 0407 02/08/14 0403 02/09/14 0017 02/09/14 0812  HGB 8.5* 9.0* 8.2* 8.8*  HCT 27.6* 27.4* 26.5* 28.7*  PLT 174 177 174 184  LABPROT 21.3*  --   --   --   INR 1.83*  --   --   --   HEPARINUNFRC  --   --  0.37 0.50  CREATININE 2.02* 2.23* 2.10*  --     Estimated Creatinine Clearance: 24.5 mL/min (by C-G formula based on Cr of 2.1).  Assessment: 79 year old female admitted with non-healing wound on Coumadin PTA for Afib Now transitioned to heparin awaiting appropriate INR for surgery HL is therapeutic x 2, CBC stable  Goal of Therapy:  Heparin level 0.3-0.7 units/ml Monitor platelets by anticoagulation protocol: Yes   Plan:  Continue heparin at 1050 units / hr Follow up AM labs  Thank you. Okey RegalLisa Jamien Casanova, PharmD [phone]  02/09/2014,9:45 AM

## 2014-02-09 NOTE — Progress Notes (Signed)
ANTICOAGULATION CONSULT NOTE - Follow Up Consult  Pharmacy Consult for heparin Indication: atrial fibrillation  Labs:  Recent Labs  02/07/14 0407 02/08/14 0403 02/09/14 0017  HGB 8.5* 9.0* 8.2*  HCT 27.6* 27.4* 26.5*  PLT 174 177 174  LABPROT 21.3*  --   --   INR 1.83*  --   --   HEPARINUNFRC  --   --  0.37  CREATININE 2.02* 2.23* 2.10*    Assessment/Plan:  79yo female therapeutic on heparin with initial dosing for Afib. Will continue gtt at current rate and confirm stable with additional level.   Vernard GamblesVeronda Moyses Pavey, PharmD, BCPS  02/09/2014,1:06 AM

## 2014-02-09 NOTE — Progress Notes (Signed)
Pt instructed 5 times worked  with to try to understand and complete Incentive spirometer. Pt unable to get more than 250 cc when goal is 1400. Ordered flutter valve for use

## 2014-02-10 ENCOUNTER — Encounter (HOSPITAL_COMMUNITY): Payer: Self-pay | Admitting: Critical Care Medicine

## 2014-02-10 ENCOUNTER — Encounter (HOSPITAL_COMMUNITY): Admission: AD | Disposition: E | Payer: Self-pay | Source: Other Acute Inpatient Hospital | Attending: Internal Medicine

## 2014-02-10 LAB — BLOOD GAS, ARTERIAL
ACID-BASE EXCESS: 6.7 mmol/L — AB (ref 0.0–2.0)
Bicarbonate: 32.6 mEq/L — ABNORMAL HIGH (ref 20.0–24.0)
DRAWN BY: 313061
O2 CONTENT: 2 L/min
O2 Saturation: 86.4 %
Patient temperature: 98.6
TCO2: 34.6 mmol/L (ref 0–100)
pCO2 arterial: 64.6 mmHg (ref 35.0–45.0)
pH, Arterial: 7.323 — ABNORMAL LOW (ref 7.350–7.450)
pO2, Arterial: 54.7 mmHg — ABNORMAL LOW (ref 80.0–100.0)

## 2014-02-10 LAB — BASIC METABOLIC PANEL
Anion gap: 11 (ref 5–15)
BUN: 38 mg/dL — AB (ref 6–23)
CHLORIDE: 98 meq/L (ref 96–112)
CO2: 32 meq/L (ref 19–32)
CREATININE: 1.89 mg/dL — AB (ref 0.50–1.10)
Calcium: 8.7 mg/dL (ref 8.4–10.5)
GFR calc Af Amer: 28 mL/min — ABNORMAL LOW (ref >90)
GFR calc non Af Amer: 25 mL/min — ABNORMAL LOW (ref >90)
GLUCOSE: 176 mg/dL — AB (ref 70–99)
POTASSIUM: 3.7 meq/L (ref 3.7–5.3)
Sodium: 141 mEq/L (ref 137–147)

## 2014-02-10 LAB — GLUCOSE, CAPILLARY
GLUCOSE-CAPILLARY: 185 mg/dL — AB (ref 70–99)
GLUCOSE-CAPILLARY: 182 mg/dL — AB (ref 70–99)
Glucose-Capillary: 188 mg/dL — ABNORMAL HIGH (ref 70–99)
Glucose-Capillary: 155 mg/dL — ABNORMAL HIGH (ref 70–99)
Glucose-Capillary: 256 mg/dL — ABNORMAL HIGH (ref 70–99)
Glucose-Capillary: 318 mg/dL — ABNORMAL HIGH (ref 70–99)

## 2014-02-10 LAB — CBC
HEMATOCRIT: 28 % — AB (ref 36.0–46.0)
HEMOGLOBIN: 8.4 g/dL — AB (ref 12.0–15.0)
MCH: 26.7 pg (ref 26.0–34.0)
MCHC: 30 g/dL (ref 30.0–36.0)
MCV: 88.9 fL (ref 78.0–100.0)
Platelets: 180 10*3/uL (ref 150–400)
RBC: 3.15 MIL/uL — AB (ref 3.87–5.11)
RDW: 16 % — ABNORMAL HIGH (ref 11.5–15.5)
WBC: 9 10*3/uL (ref 4.0–10.5)

## 2014-02-10 LAB — PROTIME-INR
INR: 1.21 (ref 0.00–1.49)
Prothrombin Time: 15.4 seconds — ABNORMAL HIGH (ref 11.6–15.2)

## 2014-02-10 LAB — HEPARIN LEVEL (UNFRACTIONATED)
HEPARIN UNFRACTIONATED: 0.27 [IU]/mL — AB (ref 0.30–0.70)
Heparin Unfractionated: 0.25 IU/mL — ABNORMAL LOW (ref 0.30–0.70)

## 2014-02-10 SURGERY — AMPUTATION, ABOVE KNEE
Anesthesia: General | Laterality: Left

## 2014-02-10 MED ORDER — HEPARIN (PORCINE) IN NACL 100-0.45 UNIT/ML-% IJ SOLN
1450.0000 [IU]/h | INTRAMUSCULAR | Status: DC
  Administered 2014-02-10: 1200 [IU]/h via INTRAVENOUS
  Administered 2014-02-11: 1500 [IU]/h via INTRAVENOUS
  Filled 2014-02-10 (×6): qty 250

## 2014-02-10 MED ORDER — AMIODARONE LOAD VIA INFUSION
150.0000 mg | Freq: Once | INTRAVENOUS | Status: AC
  Administered 2014-02-10: 150 mg via INTRAVENOUS
  Filled 2014-02-10: qty 83.34

## 2014-02-10 MED ORDER — LIDOCAINE HCL (CARDIAC) 20 MG/ML IV SOLN
INTRAVENOUS | Status: AC
  Filled 2014-02-10: qty 5

## 2014-02-10 MED ORDER — HEPARIN (PORCINE) IN NACL 100-0.45 UNIT/ML-% IJ SOLN
1050.0000 [IU]/h | INTRAMUSCULAR | Status: DC
  Filled 2014-02-10: qty 250

## 2014-02-10 MED ORDER — FENTANYL CITRATE 0.05 MG/ML IJ SOLN
INTRAMUSCULAR | Status: AC
  Filled 2014-02-10: qty 5

## 2014-02-10 MED ORDER — AMIODARONE HCL IN DEXTROSE 360-4.14 MG/200ML-% IV SOLN
30.0000 mg/h | INTRAVENOUS | Status: DC
  Administered 2014-02-11 – 2014-02-15 (×9): 30 mg/h via INTRAVENOUS
  Filled 2014-02-10 (×24): qty 200

## 2014-02-10 MED ORDER — PROPOFOL 10 MG/ML IV BOLUS
INTRAVENOUS | Status: AC
  Filled 2014-02-10: qty 20

## 2014-02-10 MED ORDER — AMIODARONE HCL IN DEXTROSE 360-4.14 MG/200ML-% IV SOLN
60.0000 mg/h | INTRAVENOUS | Status: AC
  Administered 2014-02-10 (×2): 60 mg/h via INTRAVENOUS
  Filled 2014-02-10 (×2): qty 200

## 2014-02-10 NOTE — Progress Notes (Signed)
ANTICOAGULATION CONSULT NOTE - Follow Up Consult  Pharmacy Consult for Heparin Indication: atrial fibrillation  Allergies  Allergen Reactions  . Ativan [Lorazepam] Other (See Comments)    Unknown  . Morphine And Related Other (See Comments)    MAR  . Xanax Weber CooksXr [Alprazolam Er]     Oversedation    Patient Measurements: Height: 5\' 5"  (165.1 cm) Weight: 171 lb 4.8 oz (77.701 kg) IBW/kg (Calculated) : 57 Heparin Dosing Weight: 74 kg  Labs:  Recent Labs  02/08/14 0403  02/09/14 0017 02/09/14 0812 02/22/2014 0400 02/11/2014 1950  HGB 9.0*  --  8.2* 8.8* 8.4*  --   HCT 27.4*  --  26.5* 28.7* 28.0*  --   PLT 177  --  174 184 180  --   LABPROT  --   --   --  15.7* 15.4*  --   INR  --   --   --  1.24 1.21  --   HEPARINUNFRC  --   < > 0.37 0.50 0.27* 0.25*  CREATININE 2.23*  --  2.10*  --  1.89*  --   < > = values in this interval not displayed.  Estimated Creatinine Clearance: 26.4 mL/min (by C-G formula based on Cr of 1.89).  Assessment: 79 year old female admitted with non-healing wound on Coumadin PTA for Afib. Now transitioned to heparin awaiting appropriate INR and time for surgery.  HL low this AM at 0.27, it was stopped at 9 am in anticipation of surgery and has been off for 3 hours. Surgery has been cancelled until patient is stable from a cardiac standpoint.  Repeat HL is subtherapeutic at 0.25 on heparin 1200 units/hr.  Goal of Therapy:  Heparin level 0.3-0.7 units/ml Monitor platelets by anticoagulation protocol: Yes   Plan:  Increase heparin to 1350 units/hr 8 hour heparin level Daily HL/CBC Monitor s/sx of bleeding  Arlean Hoppingorey M. Newman PiesBall, PharmD Clinical Pharmacist Pager [phone] 02/23/2014,8:53 PM

## 2014-02-10 NOTE — Progress Notes (Signed)
ANTICOAGULATION CONSULT NOTE - Follow Up Consult  Pharmacy Consult for Heparin Indication: atrial fibrillation  Allergies  Allergen Reactions  . Ativan [Lorazepam] Other (See Comments)    Unknown  . Morphine And Related Other (See Comments)    MAR  . Xanax Weber CooksXr [Alprazolam Er]     Oversedation     Labs:  Recent Labs  02/08/14 0403 02/09/14 0017 02/09/14 0812 2013-04-25 0400  HGB 9.0* 8.2* 8.8* 8.4*  HCT 27.4* 26.5* 28.7* 28.0*  PLT 177 174 184 180  LABPROT  --   --  15.7* 15.4*  INR  --   --  1.24 1.21  HEPARINUNFRC  --  0.37 0.50 0.27*  CREATININE 2.23* 2.10*  --  1.89*    Estimated Creatinine Clearance: 26.3 mL/min (by C-G formula based on Cr of 1.89).  Assessment: 79 year old female admitted with non-healing wound on Coumadin PTA for Afib Now transitioned to heparin awaiting appropriate INR and time for surgery HL low this AM at 0.27, it was stopped at 9 am in anticipation of surgery and has been off for 3 hours  Goal of Therapy:  Heparin level 0.3-0.7 units/ml Monitor platelets by anticoagulation protocol: Yes   Plan:  Increase heparin to 1200 units / hr 8 hour heparin level  Thank you. Okey RegalLisa Husayn Reim, PharmD [phone]  02/25/2014,12:00 PM

## 2014-02-10 NOTE — Progress Notes (Signed)
   VASCULAR SURGERY ASSESSMENT & PLAN:  * Left AKA was cancelled today because she went into rapid afib.    * Will reschedule surgery when she is stable from a cardiac standpoint.   SUBJECTIVE: Tired  PHYSICAL EXAM: Filed Vitals:   02/05/2014 0600 02/18/2014 0809 03/04/2014 1205 02/11/2014 1654  BP: 154/62 117/57 96/62 108/50  Pulse: 97 122    Temp:  98.5 F (36.9 C) 98.2 F (36.8 C) 97.6 F (36.4 C)  TempSrc:  Oral Oral Oral  Resp: 12     Height:      Weight:    174 lb 4.8 oz (79.062 kg)  SpO2: 96% 93%     Dressing left foot with min drainage. No significant erythema on left leg.  LABS: Lab Results  Component Value Date   WBC 9.0 02/19/2014   HGB 8.4* 02/08/2014   HCT 28.0* 02/05/2014   MCV 88.9 02/23/2014   PLT 180 02/27/2014   Lab Results  Component Value Date   CREATININE 1.89* 02/20/2014   Lab Results  Component Value Date   INR 1.21 02/24/2014   CBG (last 3)   Recent Labs  02/09/2014 0428 03/02/2014 0812 02/24/2014 1203  GLUCAP 185* 155* 182*    Principal Problem:   Foot ulcer, left Active Problems:   Chronic atrial fibrillation   Essential hypertension   Diabetes mellitus   Warfarin anticoagulation   Chronic diastolic CHF (congestive heart failure)   PAD (peripheral artery disease)   CKD (chronic kidney disease) stage 3, GFR 30-59 ml/min   Foot ulcer   Palliative care encounter   Encephalopathy   CHF (congestive heart failure)   Acute renal failure superimposed on stage 3 chronic kidney disease   PAF (paroxysmal atrial fibrillation)   Pulmonary HTN   Cari CarawayChris Maddalynn Barnard Beeper: [phone] 02/27/2014

## 2014-02-10 NOTE — Progress Notes (Signed)
Patient: [name] / Admit Date: 02/12/2014 / Date of Encounter: March 09, 2014, 10:46 AM   Subjective: When asked how she feels, she says she's feeling "pretty good." Upon further specific questioning she does endorse feeling a little lightheaded with palpitations. No CP or dyspnea.  Objective: Telemetry: went into AF RVR between 6-7am Physical Exam: Blood pressure 117/57, pulse 122, temperature 98.5 F (36.9 C), temperature source Oral, resp. rate 12, height 5\' 6"  (1.676 m), weight 172 lb (78.019 kg), SpO2 93 %. General: Well developed chronically ill kyphotic female in no acute distress. Head: Normocephalic, atraumatic, sclera non-icteric, no xanthomas, nares are without discharge. Neck: +mild JVD. Lungs: Diminished BS throughout without wheezes, rales, or rhonchi. Breathing is unlabored. Heart: Irregular rhythm, rapid rate, S1 S2 without murmurs, rubs, or gallops.  Abdomen: Soft, non-tender, non-distended with normoactive bowel sounds. No rebound/guarding. Extremities: No clubbing or cyanosis. 1+ BLE pitting edema. Distal pedal pulses are 2+ and equal bilaterally. Neuro: Alert and oriented X 3. Moves all extremities spontaneously. Psych:  Responds to questions appropriately with a normal affect.  Intake/Output Summary (Last 24 hours) at 2014-03-09 1046 Last data filed at 09-Mar-2014 0744  Gross per 24 hour  Intake  657.5 ml  Output    500 ml  Net  157.5 ml    Inpatient Medications:  . amitriptyline  50 mg Oral QHS  . antiseptic oral rinse  7 mL Mouth Rinse BID  . carvedilol  6.25 mg Oral BID WC  . ceFEPime (MAXIPIME) IV  1 g Intravenous Q24H  . docusate sodium  100 mg Oral BID  . feeding supplement (GLUCERNA SHAKE)  237 mL Oral BID BM  . hydrALAZINE  50 mg Oral 3 times per day  . insulin aspart  0-9 Units Subcutaneous 6 times per day  . iron polysaccharides  150 mg Oral BID WC  . isosorbide dinitrate  40 mg Oral TID WC  . pantoprazole  40 mg Oral Daily  . sodium chloride  3  mL Intravenous Q12H  . sucralfate  1 g Oral TID WC & HS  . vancomycin  750 mg Intravenous Q24H   Infusions:    Labs:  Recent Labs  02/09/14 0017 09-Mar-2014 0400  NA 137 141  K 3.6* 3.7  CL 94* 98  CO2 30 32  GLUCOSE 199* 176*  BUN 40* 38*  CREATININE 2.10* 1.89*  CALCIUM 8.5 8.7   No results for input(s): AST, ALT, ALKPHOS, BILITOT, PROT, ALBUMIN in the last 72 hours.  Recent Labs  02/09/14 0812 03/09/14 0400  WBC 5.4 9.0  HGB 8.8* 8.4*  HCT 28.7* 28.0*  MCV 87.8 88.9  PLT 184 180   No results for input(s): CKTOTAL, CKMB, TROPONINI in the last 72 hours. Invalid input(s): POCBNP No results for input(s): HGBA1C in the last 72 hours.   Radiology/Studies:  Dg Chest 2 View  03/02/2014   CLINICAL DATA:  Preoperative chest radiograph for amputation above the knee. Cough and altered mental status. Initial encounter.  EXAM: CHEST  2 VIEW  COMPARISON:  Chest radiograph performed 01/27/2014  FINDINGS: The lungs are well-aerated. Moderate right and small left pleural effusions are seen, with bibasilar airspace opacification, similar in appearance to the prior study, concerning for persistent mild pulmonary edema. Underlying vascular congestion is seen. No pneumothorax is identified.  The heart is enlarged.  No acute osseous abnormalities are seen.  IMPRESSION: Moderate and small left pleural effusions, with bibasilar airspace opacification, similar in appearance to the prior study and concerning  for persistent mild pulmonary edema. Cardiomegaly noted.   Electronically Signed   By: Roanna RaiderJeffery  Chang M.D.   On: 02/11/2014 03:50   Ct Head Wo Contrast  02/06/2014   CLINICAL DATA:  Acute encephalopathy  EXAM: CT HEAD WITHOUT CONTRAST  TECHNIQUE: Contiguous axial images were obtained from the base of the skull through the vertex without intravenous contrast.  COMPARISON:  01/28/2014  FINDINGS: No skull fracture is noted. Paranasal sinuses and mastoid air cells are unremarkable. Stable cerebral  atrophy. No intracranial hemorrhage, mass effect or midline shift. Again noted periventricular chronic white matter disease. No definite acute cortical infarction. No mass lesion is noted on this unenhanced scan. Atherosclerotic calcifications of carotid siphon.  IMPRESSION: No acute intracranial abnormality. Stable atrophy and chronic white matter disease. No definite acute cortical infarction.   Electronically Signed   By: Natasha MeadLiviu  Pop M.D.   On: 02/06/2014 11:45   Ct Head Wo Contrast  01/28/2014   CLINICAL DATA:  CVA  EXAM: CT HEAD WITHOUT CONTRAST  TECHNIQUE: Contiguous axial images were obtained from the base of the skull through the vertex without intravenous contrast.  COMPARISON:  01/11/2014  FINDINGS: No skull fracture is noted. Paranasal sinuses and mastoid air cells are unremarkable. No intracranial hemorrhage, mass effect or midline shift. Stable mild cerebral atrophy. Stable periventricular chronic white matter disease. No acute cortical infarction. No mass lesion is noted on this unenhanced scan.  IMPRESSION: No acute intracranial abnormality. Mild cerebral atrophy. Stable periventricular chronic mild white matter disease.   Electronically Signed   By: Natasha MeadLiviu  Pop M.D.   On: 01/28/2014 14:57   Dg Chest Port 1 View  01/27/2014   CLINICAL DATA:  Shortness of breath.  EXAM: PORTABLE CHEST - 1 VIEW  COMPARISON:  01/14/2014  FINDINGS: Mild cardiomegaly. Bilateral airspace opacities likely reflects mild edema. There are bilateral pleural effusions, small on the left and moderate on the right. The right effusion is increased since prior study. Probable bibasilar atelectasis.  IMPRESSION: Mild CHF. Small left effusion and enlarging moderate right effusion with bibasilar atelectasis.   Electronically Signed   By: Charlett NoseKevin  Dover M.D.   On: 01/27/2014 10:15     Assessment and Plan  1. Severe PAD with left nonhealing foot ulcer  - previous plan for L AKA today but this is on hold AF-RVR - we have contacted  Dr. Edilia Boickson since patient was being called for by OR   2. Paroxysmal atrial fibrillation  - went into RVR this morning (was previously maintaining NSR) - BP currently prohibitive of med titration - will stop further hydralazine dosing for now to give us more room for HR control - load with IV amiodarone and start drip now - received vitamin K on admission and has since been on IV heparin - per discussion between OR and Dr. Excell Seltzerooper, resume IV heparin since surgery is on hold  3. Acute on chronic diastolic heart failure/likely secondary pulmonary hypertension, RHF, and bilateral pulmonary effusions - renal function worsened with IV lasix thus it had been held (peak 2.23, recent lowest value 1.88), improved this AM - previously documented bed weights intermittently inaccurate - needs to weigh today; unclear if I/O's accurate - Cr appears near back to recent baseline but I am hesitant to resume diuresis in the setting of NPO, tachycardia and hypotension for fear of precipitating worsened renal function - will hold off for now  4. Acute on chronic kidney disease stage 3 in setting of diuresis, see above 5.Acute encephalopathy, improved  6. Anemia - Hgb 9.1 in 11/2013 7. Acute on chronic hypercarbic respiratory failure s/p bipap 8. [facility] felt due to CHF  Signed, Ronie Spiesayna Dunn PA-C  Patient seen, examined. Available data reviewed. Agree with findings, assessment, and plan as outlined by Ronie Spiesayna Dunn PA-C. The patient was independently evaluated and examined. She went into atrial fibrillation with RVR this morning at about 6:30 AM. Heart rate is currently ranging from 125-140 bpm. Her systolic blood pressure is approximately 90 mmHg. She otherwise has no acute complaints and denies chest pain or shortness of breath. I do not think she is stable to proceed with planned left leg AKA today because of her heart rhythm. We will start her on IV amiodarone and what she converts  to sinus rhythm with hemodynamic stability, proceed with surgery. Will hold hydralazine to allow for better pressure while she is in atrial fib. Will resume IV heparin until she is rescheduled for surgery. Otherwise plans as documented above.  Tonny BollmanMichael Ziv Welchel, M.D. 02/09/2014 11:44 AM

## 2014-02-10 NOTE — Progress Notes (Addendum)
PROGRESS NOTE  [name] FUX:[id number] DOB: May 15, 1934 DOA: 02/23/2014 PCP: Manon Hilding, MD  HPI Patient is a 79 yo female with a PMH of afib, (on warfarin), CVA, PVD, CHF, CKD IV, and pulmonary hypertension presented to the ER from [facility] for AKA due to non healing lower extremity wound, with plans for vascular surgery for amputation and 12/3. Shortly after admission on 12/3 in the morning, patient was very lethargic therefore the surgery was canceled. An ABG showed hypercarbic respiratory failure, patient was placed on BiPAP and moved to step down unit. She was also started on broad-spectrum antibiotics for the lower extremity wound. Her mental status improved significantly and she returned back to baseline on 12/5. Cardiology was consulted on patient's admission for preop clearance and they have been following patient while hospitalized. She has a history of CHF and was diuresed with IV Lasix, and her diuresis was on hold on 12/7 due to worsening renal failure. On 12/18 the morning, patient is again more lethargic and developed A fib w RVR.   Subjective:   - more drowsy this morning  Assessment/Plan:  Acute encephalopathy Uncertain etiology, intermittent, had a really god past 3 days however this morning looks more drowsy  Obtain ABG Patient has received no benzodiazepines and very little narcotics. One Vicodin on 12/7 at 1 pm was the last dose. Continue vancomycin and cefepime   PVD with non healing wound For AKA per vascular surgery  Appreciate Cardiology Consultation for surgical clearance.  Patient is moderate to high risk. Continue vancomycin and cefepime for presumed underlying infection and will keep on antibiotics until surgery   Afib - with RVR this morning, mild hypotension Holding coumadin.  On heparin gtt Appreciate cardiology input No surgery today  Chronic Kidney disease stage IV Baseline creatinine is approximate 2.2. Stable Lasix on hold yesterday per  cards  Bilateral Pleural Effusions with chronic mild pulmonary edema. - Continue Lasix per cardiology   - Cardiology following    Acute on chronic combined systolic and diastolic heart failure. 2D echo results below. Coreg, isordil.  HTN Well controlled on HTN, Coreg, isordil.  DM II  CBGs well controlled on current insulin regimen. A1C is 6.9.  Goals of care Palliative was consulted and they have met with the patient and the family. Fortunately the patient's mental status recovered to baseline and she expressed her wishes to continue to be full code and to continue with the surgery.  DVT Prophylaxis:  lovenox.   Code Status: Currently full code.   Family Communication: d/w daughter this morning Disposition Plan: Inpatient.   Consultants:  Vascular  Cardiology  Procedures:  2D Echo The estimated ejection fraction was in the range of 45% to 50%. There is akinesis of the mid-apicalanteroseptal myocardium. Features are consistent with a pseudonormal left ventricular filling pattern, with concomitant abnormal relaxation and increased filling pressure (grade 2 diastolic dysfunction).  Antibiotics: Vancomycin 12/3 >> Cefepime 12/3 >>  Objective: Filed Vitals:   02/09/14 2200 02/08/2014 0000 02/06/2014 0025 02/22/2014 0429  BP: 133/56 119/51 119/51 150/67  Pulse: 89 85 85 93  Temp:  98.3 F (36.8 C) 98.3 F (36.8 C) 97.9 F (36.6 C)  TempSrc:  Oral Axillary Axillary  Resp: _0 Height:      Weight:      SpO2: 95% 93% 94% 97%    Intake/Output Summary (Last 24 hours) at 02/18/2014 0713 Last data filed at 02/12/2014 0100  Gross per 24 hour  Intake   1372 ml  Output    100 ml  Net   1272 ml   Filed Weights   02/08/14 0838 02/09/14 0001 02/09/14 1215  Weight: 77.701 kg (171 lb 4.8 oz) 84.2 kg (185 lb 10 oz) 78.019 kg (172 lb)   Exam: General: NAD, drowsy, sitting at the edge of the bed HEENT:   MMM.  Neck: Supple, no JVD, no masses  Cardiovascular: RRR,  S1 S2 auscultated, no rubs, murmurs or gallops.   Respiratory: Clear to auscultation bilaterally with equal chest rise  Abdomen: Soft, nontender, slightly distended, + bowel sounds  Extremities: 1-2+ edema, warm.  LLE ulceration wrapped in clean gauze. Neuro: she is alert but needs to be prompted to answer questions  Data Reviewed: Basic Metabolic Panel:  Recent Labs Lab 03/05/2014 0515 02/06/14 0320 02/07/14 0407 02/08/14 0403 02/09/14 0017 02/26/2014 0400  NA 143 145 141 139 137 141  K 3.9 4.7 4.0 3.7 3.6* 3.7  CL 97 99 97 96 94* 98  CO2 32 31 33* 31 30 32  GLUCOSE 89 85 124* 97 199* 176*  BUN 40* 38* 42* 40* 40* 38*  CREATININE 1.88* 1.89* 2.02* 2.23* 2.10* 1.89*  CALCIUM 9.3 9.1 8.9 8.8 8.5 8.7  MG 2.0  --   --   --   --   --   PHOS 4.0  --   --   --   --   --    Liver Function Tests:  Recent Labs Lab 02/20/2014 2325 02/27/2014 0515 02/07/14 0407  AST _0 ALT _1 ALKPHOS 76 79 82  BILITOT 0.3 0.3 0.3  PROT 7.2 7.2 6.6  ALBUMIN 2.7* 2.9* 2.5*   CBC:  Recent Labs Lab 02/24/2014 2325  02/07/14 0407 02/08/14 0403 02/09/14 0017 02/09/14 0812 02/23/2014 0400  WBC 6.2  < > 5.7 5.2 5.6 5.4 9.0  NEUTROABS 4.3  --   --   --   --   --   --   HGB 8.8*  < > 8.5* 9.0* 8.2* 8.8* 8.4*  HCT 28.7*  < > 27.6* 27.4* 26.5* 28.7* 28.0*  MCV 88.3  < > 91.4 90.1 87.5 87.8 88.9  PLT 207  < > 174 177 174 184 180  < > = values in this interval not displayed.CBG:  Recent Labs Lab 02/09/14 0753 02/09/14 1700 02/09/14 1952 02/15/2014 0024 02/25/2014 0428  GLUCAP 167* 272* 164* 188* 185*   Recent Results (from the past 240 hour(s))  Surgical pcr screen     Status: None   Collection Time: 02/21/2014 12:17 AM  Result Value Ref Range Status   MRSA, PCR NEGATIVE NEGATIVE Final   Staphylococcus aureus NEGATIVE NEGATIVE Final    Comment:        The Xpert SA Assay (FDA approved for NASAL specimens in patients over 34 years of age), is one component of a comprehensive  surveillance program.  Test performance has been validated by [employer] for patients greater than or equal to 20 year old. It is not intended to diagnose infection nor to guide or monitor treatment.    Studies: No results found. Scheduled Meds: . amitriptyline  50 mg Oral QHS  . antiseptic oral rinse  7 mL Mouth Rinse BID  . carvedilol  6.25 mg Oral BID WC  . ceFEPime (MAXIPIME) IV  1 g Intravenous Q24H  . docusate sodium  100 mg Oral BID  . feeding supplement (GLUCERNA SHAKE)  237 mL  Oral BID BM  . hydrALAZINE  50 mg Oral 3 times per day  . insulin aspart  0-9 Units Subcutaneous 6 times per day  . iron polysaccharides  150 mg Oral BID WC  . isosorbide dinitrate  40 mg Oral TID WC  . pantoprazole  40 mg Oral Daily  . sodium chloride  3 mL Intravenous Q12H  . sucralfate  1 g Oral TID WC & HS  . vancomycin  750 mg Intravenous Q24H   Continuous Infusions: . heparin 1,050 Units/hr (03/04/2014 0100)    Principal Problem:   Foot ulcer, left Active Problems:   Chronic atrial fibrillation   Essential hypertension   Diabetes mellitus   Warfarin anticoagulation   Chronic diastolic CHF (congestive heart failure)   PAD (peripheral artery disease)   CKD (chronic kidney disease) stage 3, GFR 30-59 ml/min   Foot ulcer   Palliative care encounter   Encephalopathy   CHF (congestive heart failure)   Acute renal failure superimposed on stage 3 chronic kidney disease   PAF (paroxysmal atrial fibrillation)   Pulmonary HTN  Time spent: 25 minutes  If 7PM-7AM, please contact night-coverage at [email or web address], password [email or web address] Marzetta Board, MD [facility] [phone]

## 2014-02-11 LAB — CBC
HCT: 26.4 % — ABNORMAL LOW (ref 36.0–46.0)
HCT: 26 % — ABNORMAL LOW (ref 36.0–46.0)
HEMOGLOBIN: 7.9 g/dL — AB (ref 12.0–15.0)
Hemoglobin: 8.1 g/dL — ABNORMAL LOW (ref 12.0–15.0)
MCH: 26.8 pg (ref 26.0–34.0)
MCH: 28.1 pg (ref 26.0–34.0)
MCHC: 29.9 g/dL — ABNORMAL LOW (ref 30.0–36.0)
MCHC: 31.2 g/dL (ref 30.0–36.0)
MCV: 89.5 fL (ref 78.0–100.0)
MCV: 90.3 fL (ref 78.0–100.0)
Platelets: 164 10*3/uL (ref 150–400)
Platelets: 173 10*3/uL (ref 150–400)
RBC: 2.95 MIL/uL — AB (ref 3.87–5.11)
RBC: 2.88 MIL/uL — AB (ref 3.87–5.11)
RDW: 16 % — ABNORMAL HIGH (ref 11.5–15.5)
RDW: 15.9 % — ABNORMAL HIGH (ref 11.5–15.5)
WBC: 6.4 10*3/uL (ref 4.0–10.5)
WBC: 7.3 10*3/uL (ref 4.0–10.5)

## 2014-02-11 LAB — COMPREHENSIVE METABOLIC PANEL
ALK PHOS: 75 U/L (ref 39–117)
ALT: 11 U/L (ref 0–35)
AST: 16 U/L (ref 0–37)
Albumin: 2.3 g/dL — ABNORMAL LOW (ref 3.5–5.2)
Anion gap: 13 (ref 5–15)
BUN: 47 mg/dL — ABNORMAL HIGH (ref 6–23)
CO2: 31 meq/L (ref 19–32)
Calcium: 8.8 mg/dL (ref 8.4–10.5)
Chloride: 93 mEq/L — ABNORMAL LOW (ref 96–112)
Creatinine, Ser: 2.61 mg/dL — ABNORMAL HIGH (ref 0.50–1.10)
GFR calc non Af Amer: 17 mL/min — ABNORMAL LOW (ref >90)
GFR, EST AFRICAN AMERICAN: 19 mL/min — AB (ref >90)
Glucose, Bld: 172 mg/dL — ABNORMAL HIGH (ref 70–99)
POTASSIUM: 3.9 meq/L (ref 3.7–5.3)
Sodium: 137 mEq/L (ref 137–147)
Total Bilirubin: <0.2 mg/dL — ABNORMAL LOW (ref 0.3–1.2)
Total Protein: 6.4 g/dL (ref 6.0–8.3)

## 2014-02-11 LAB — GLUCOSE, CAPILLARY
GLUCOSE-CAPILLARY: 132 mg/dL — AB (ref 70–99)
GLUCOSE-CAPILLARY: 204 mg/dL — AB (ref 70–99)
GLUCOSE-CAPILLARY: 278 mg/dL — AB (ref 70–99)
Glucose-Capillary: 227 mg/dL — ABNORMAL HIGH (ref 70–99)
Glucose-Capillary: 146 mg/dL — ABNORMAL HIGH (ref 70–99)
Glucose-Capillary: 248 mg/dL — ABNORMAL HIGH (ref 70–99)

## 2014-02-11 LAB — HEPARIN LEVEL (UNFRACTIONATED)
HEPARIN UNFRACTIONATED: 0.26 [IU]/mL — AB (ref 0.30–0.70)
Heparin Unfractionated: 0.7 IU/mL (ref 0.30–0.70)

## 2014-02-11 LAB — FERRITIN: FERRITIN: 315 ng/mL — AB (ref 10–291)

## 2014-02-11 MED ORDER — INSULIN ASPART 100 UNIT/ML ~~LOC~~ SOLN
0.0000 [IU] | Freq: Three times a day (TID) | SUBCUTANEOUS | Status: DC
  Administered 2014-02-11 – 2014-02-12 (×3): 3 [IU] via SUBCUTANEOUS

## 2014-02-11 MED ORDER — DARBEPOETIN ALFA 60 MCG/0.3ML IJ SOSY
60.0000 ug | PREFILLED_SYRINGE | INTRAMUSCULAR | Status: DC
  Administered 2014-02-11: 60 ug via SUBCUTANEOUS
  Filled 2014-02-11: qty 0.3

## 2014-02-11 MED ORDER — HYDROCODONE-ACETAMINOPHEN 5-325 MG PO TABS
1.0000 | ORAL_TABLET | Freq: Four times a day (QID) | ORAL | Status: DC | PRN
  Administered 2014-02-13: 1 via ORAL
  Filled 2014-02-11: qty 1

## 2014-02-11 MED ORDER — VANCOMYCIN HCL IN DEXTROSE 1-5 GM/200ML-% IV SOLN
1000.0000 mg | INTRAVENOUS | Status: DC
Start: 2014-02-13 — End: 2014-02-14
  Administered 2014-02-13: 1000 mg via INTRAVENOUS
  Filled 2014-02-11: qty 200

## 2014-02-11 MED ORDER — HYDROCODONE-ACETAMINOPHEN 5-325 MG PO TABS
2.0000 | ORAL_TABLET | Freq: Once | ORAL | Status: AC
  Administered 2014-02-11: 2 via ORAL
  Filled 2014-02-11: qty 2

## 2014-02-11 MED ORDER — AMITRIPTYLINE HCL 25 MG PO TABS
25.0000 mg | ORAL_TABLET | Freq: Every day | ORAL | Status: DC
  Administered 2014-02-11 – 2014-02-13 (×3): 25 mg via ORAL
  Filled 2014-02-11 (×4): qty 1

## 2014-02-11 MED ORDER — DARBEPOETIN ALFA-POLYSORBATE 60 MCG/0.3ML IJ SOLN
60.0000 ug | INTRAMUSCULAR | Status: DC
Start: 2014-02-11 — End: 2014-02-11

## 2014-02-11 NOTE — Progress Notes (Signed)
[place] TEAM 1 - Stepdown/ICU TEAM  [name] ZHG:[id number] DOB: [birth year] DOA: 02/11/2014 PCP: Manon Hilding, MD  HPI 79 yo female with hx of afib on warfarin, CVA, PVD, CHF, CKD IV, and pulmonary hypertension who presented to the ER from [facility] for an AKA due to a non healing lower extremity wound, with plans for vascular surgery to perform an amputation 12/3. Shortly after admission the patient became very lethargic therefore surgery was canceled. An ABG showed hypercarbic respiratory failure and the patient was placed on BiPAP and moved to step down unit. She was started on broad-spectrum antibiotics for the lower extremity wound. Her mental status improved significantly and she returned to baseline on 12/5. Cardiology was consulted for preop clearance and they have been following patient while hospitalized. She has a history of CHF and was diuresed with IV Lasix, and her diuresis was on hold on 12/7 due to worsening renal failure. On 12/8 the patient was again lethargic and developed A fib w RVR.   Subjective:   Pt is sitting up at bedside performing word puzzles.  Denies cp, sob, n/v, or abdom pain.    Assessment/Plan:  Acute intermittent encephalopathy patient has received no benzodiazepines and very little narcotics - today she is alert and conversant - check H96 and folic acid levels   PVD with non healing wound L foot  For AKA per Vascular Surgery - continue vancomycin and cefepime for presumed underlying infection and will keep on antibiotics until surgery   Parox Afib with RVR  On heparin gtt - appreciate Cardiology input - now on amio gtt  Acute renal failure on Chronic Kidney disease stage IV Baseline creatinine ~2.2 - crt has increased, likely due to decreased perfusion in setting of afib w/ RVR - follow trend   Normocytic anemia Likely anemia of chronic kidney disease exacerbated by the smoldering L foot wound - transfuse as needed to keep Hgb 8.0 or > (no known  hx of CAD but TTE suggestive of possible CAD in pt w/ known PVD) - check anemia panel   Bilateral Pleural Effusions with chronic mild pulmonary edema Lasix per Cardiology - f/u CXR in AM   Acute on chronic combined systolic and diastolic heart failure 2D echo results below - Coreg, isordil  HTN No change in tx plan today   DM II  CBGs reasonably well controlled on current insulin regimen - A1C 6.9  Goals of care Palliative was consulted and they have met with the patient and the family. The patient's mental status recovered to baseline and she expressed her wishes to continue to be full code and to continue with the surgery.  DVT Prophylaxis:  lovenox Code Status: FULL    Family Communication: daughter in law at bedside Disposition Plan: SDU  Consultants: Vascular Cardiology  Procedures: 2D Echo The estimated ejection fraction was in the range of 45% to 50%. There is akinesis of the mid-apicalanteroseptal myocardium. Features are consistent with a pseudonormal left ventricular filling pattern, with concomitant abnormal relaxation and increased filling pressure (grade 2 diastolic dysfunction)  Antibiotics: Vancomycin 12/3 >> Cefepime 12/3 >>  Objective: Blood pressure 138/48, pulse 65, temperature 97.3 F (36.3 C), temperature source Oral, resp. rate 12, height $RemoveBe'5\' 6"'eROfxCqYj$  (1.676 m), weight 81.647 kg (180 lb), SpO2 100 %.  Intake/Output Summary (Last 24 hours) at 02/11/14 1030 Last data filed at 02/11/14 1000  Gross per 24 hour  Intake 1795.53 ml  Output      0 ml  Net  1795.53 ml   Filed Weights   02/09/14 1215 02/18/2014 1654 02/11/14 0430  Weight: 78.019 kg (172 lb) 79.062 kg (174 lb 4.8 oz) 81.647 kg (180 lb)   Exam: General: No acute respiratory distress - alert/oriented  Lungs: Clear to auscultation bilaterally without wheezes or crackles Cardiovascular: Regular rate and rhythm without murmur gallop or rub  Abdomen: Nontender, nondistended, soft, bowel sounds positive,  no rebound, no ascites, no appreciable mass Extremities: No significant cyanosis, clubbing, or edema bilateral lower extremities - L foot dressed and dry   Data Reviewed: Basic Metabolic Panel:  Recent Labs Lab 02/17/2014 0515  02/07/14 0407 02/08/14 0403 02/09/14 0017 02/20/2014 0400 02/11/14 0226  NA 143  < > 141 139 137 141 137  K 3.9  < > 4.0 3.7 3.6* 3.7 3.9  CL 97  < > 97 96 94* 98 93*  CO2 32  < > 33* 31 30 32 31  GLUCOSE 89  < > 124* 97 199* 176* 172*  BUN 40*  < > 42* 40* 40* 38* 47*  CREATININE 1.88*  < > 2.02* 2.23* 2.10* 1.89* 2.61*  CALCIUM 9.3  < > 8.9 8.8 8.5 8.7 8.8  MG 2.0  --   --   --   --   --   --   PHOS 4.0  --   --   --   --   --   --   < > = values in this interval not displayed.   Liver Function Tests:  Recent Labs Lab 02/03/2014 2325 02/20/2014 0515 02/07/14 0407 02/11/14 0226  AST $Re'18 15 16 16  'KEG$ ALT $R'11 10 11 11  'iJ$ ALKPHOS 76 79 82 75  BILITOT 0.3 0.3 0.3 <0.2*  PROT 7.2 7.2 6.6 6.4  ALBUMIN 2.7* 2.9* 2.5* 2.3*   CBC:  Recent Labs Lab 02/26/2014 2325  02/08/14 0403 02/09/14 0017 02/09/14 0812 02/27/2014 0400 02/11/14 0226  WBC 6.2  < > 5.2 5.6 5.4 9.0 6.4  NEUTROABS 4.3  --   --   --   --   --   --   HGB 8.8*  < > 9.0* 8.2* 8.8* 8.4* 7.9*  HCT 28.7*  < > 27.4* 26.5* 28.7* 28.0* 26.4*  MCV 88.3  < > 90.1 87.5 87.8 88.9 89.5  PLT 207  < > 177 174 184 180 164  < > = values in this interval not displayed.CBG:  Recent Labs Lab 02/28/2014 1655 02/10/14 2004 02/11/14 0046 02/11/14 0428 02/11/14 0750  GLUCAP 256* 318* 227* 146* 132*   Recent Results (from the past 240 hour(s))  Surgical pcr screen     Status: None   Collection Time: 02/11/2014 12:17 AM  Result Value Ref Range Status   MRSA, PCR NEGATIVE NEGATIVE Final   Staphylococcus aureus NEGATIVE NEGATIVE Final    Comment:        The Xpert SA Assay (FDA approved for NASAL specimens in patients over 2 years of age), is one component of a comprehensive surveillance program.  Test  performance has been validated by [employer] for patients greater than or equal to 80 year old. It is not intended to diagnose infection nor to guide or monitor treatment.    Studies: No results found.   Scheduled Meds: . amitriptyline  50 mg Oral QHS  . antiseptic oral rinse  7 mL Mouth Rinse BID  . carvedilol  6.25 mg Oral BID WC  . ceFEPime (MAXIPIME) IV  1 g Intravenous Q24H  .  docusate sodium  100 mg Oral BID  . feeding supplement (GLUCERNA SHAKE)  237 mL Oral BID BM  . insulin aspart  0-9 Units Subcutaneous 6 times per day  . iron polysaccharides  150 mg Oral BID WC  . isosorbide dinitrate  40 mg Oral TID WC  . pantoprazole  40 mg Oral Daily  . sodium chloride  3 mL Intravenous Q12H  . sucralfate  1 g Oral TID WC & HS  . [START ON 02/19/2014] vancomycin  1,000 mg Intravenous Q48H   Continuous Infusions: . amiodarone 30 mg/hr (02/11/14 0107)  . heparin 1,500 Units/hr (02/11/14 2353)   Time spent: 35 minutes  Cherene Altes, MD [facility] For Consults/Admissions - Flow Manager - [phone] Office  [phone] Pager [phone]  On-Call/Text Page:      [email or web address]      password [facility]  10:56 AM  02/11/2014

## 2014-02-11 NOTE — Progress Notes (Signed)
ANTICOAGULATION CONSULT NOTE - Follow Up Consult  Pharmacy Consult for heparin Indication: atrial fibrillation  Labs:  Recent Labs  02/08/14 0403  02/09/14 0017 02/09/14 0812 02/26/2014 0400 03/02/2014 1950 02/11/14 0226  HGB 9.0*  --  8.2* 8.8* 8.4*  --  7.9*  HCT 27.4*  --  26.5* 28.7* 28.0*  --  26.4*  PLT 177  --  174 184 180  --  164  LABPROT  --   --   --  15.7* 15.4*  --   --   INR  --   --   --  1.24 1.21  --   --   HEPARINUNFRC  --   < > 0.37 0.50 0.27* 0.25* 0.26*  CREATININE 2.23*  --  2.10*  --  1.89*  --   --   < > = values in this interval not displayed.   Assessment: 79yo female remains subtherapeutic on heparin despite rate increase last pm with very little increase in level.  Goal of Therapy:  Heparin level 0.3-0.7 units/ml   Plan:  Will increase heparin gtt by 2 units/kg/hr to 1500 units/hr and check level in 8hr.  [name], PharmD, BCPS  02/11/2014,3:37 AM

## 2014-02-11 NOTE — Progress Notes (Addendum)
ANTIBIOTIC CONSULT NOTE - FOLLOW UP  Pharmacy Consult for vancomycin and cefepime Indication: UTI, HCAP , non healing wound (AKA)   Allergies  Allergen Reactions  . Ativan [Lorazepam] Other (See Comments)    Unknown  . Morphine And Related Other (See Comments)    MAR  . Xanax Weber CooksXr [Alprazolam Er]     Oversedation    Labs:  Recent Labs  02/09/14 0017 02/09/14 0812 2013-06-24 0400 02/11/14 0226  WBC 5.6 5.4 9.0 6.4  HGB 8.2* 8.8* 8.4* 7.9*  PLT 174 184 180 164  CREATININE 2.10*  --  1.89* 2.61*   Estimated Creatinine Clearance: 19.4 mL/min (by C-G formula based on Cr of 2.61).  Recent Labs  02/08/14 1652  VANCOTROUGH 25.2*     Assessment: [name] with UTI, HCAP, non-healing leg wound on vancomycin and cefepime. Renal function is worse today  WBC = 6.4, Scr = 2.61 (trending up), afebrile 12/3 Vancomycin> 12/3 Cefepime>  AKA surgery planned when stable from cardiac standpoint   Goal of Therapy:  Vancomycin trough level 15-20 mcg/ml  Plan:  Decrease Vancomycin to 1 gram iv Q 48 hours Continue Cefepime 1 gram iv Q 24 hours Continue to follow  What is planned LOT?  Thank you. Okey RegalLisa Sashia Campas, PharmD [phone] 02/11/2014 9:56 AM

## 2014-02-11 NOTE — Progress Notes (Signed)
[name] is sitting up on the side of her bed with her glasses on working on a puzzle book - this is her baseline per family. She was more lethargic yesterday but is much more alert today and did not require BiPAP. Plan is to proceed with surgery as her atrial fibrillation is controlled with amiodarone infusion. Monitoring kidney function closely. She has no complaints today - no pain, anxiety, sleep disturbance. She had an isolated incidence of nausea yesterday relieved by zofran. No further questions/concerns at this time. She has been clear her wishes are for continued treatment and surgery as planned. Have discussed with family concerns with chronic disease and long term prognosis - I will continue to follow.   Kelli ChannelAlicia [name], NP Palliative Medicine Team Pager # [phone] (M-F 8a-5p) Team Phone # [phone] (Nights/Weekends)

## 2014-02-11 NOTE — Progress Notes (Signed)
    Subjective:  No chest pain or shortness of breath.  Objective:  Vital Signs in the last 24 hours: Temp:  [97.3 F (36.3 C)-98.2 F (36.8 C)] 97.3 F (36.3 C) (12/09 0752) Pulse Rate:  [65-72] 65 (12/09 0800) Resp:  [12-21] 12 (12/09 0800) BP: (96-138)/(48-73) 138/48 mmHg (12/09 0800) SpO2:  [98 %-100 %] 100 % (12/09 0800) FiO2 (%):  [40 %] 40 % (12/09 0000) Weight:  [174 lb 4.8 oz (79.062 kg)-180 lb (81.647 kg)] 180 lb (81.647 kg) (12/09 0430)  Intake/Output from previous day: 12/08 0701 - 12/09 0700 In: 1620.9 [P.O.:720; I.V.:700.9; IV Piggyback:200] Out: 400 [Urine:400]  Physical Exam: Pt is alert and oriented, NAD HEENT: normal Neck: JVP - normal Lungs: Poor air movement bilaterally but no rales are present CV: RRR without murmur or gallop Abd: soft, NT, Positive BS, no hepatomegaly Ext: no edema Skin: warm/dry no rash   Lab Results:  Recent Labs  02/27/2014 0400 02/11/14 0226  WBC 9.0 6.4  HGB 8.4* 7.9*  PLT 180 164    Recent Labs  02/10/2014 0400 02/11/14 0226  NA 141 137  K 3.7 3.9  CL 98 93*  CO2 32 31  GLUCOSE 176* 172*  BUN 38* 47*  CREATININE 1.89* 2.61*   No results for input(s): TROPONINI in the last 72 hours.  Invalid input(s): CK, MB  Tele: Normal sinus rhythm, patient converted from rapid atrial fibrillation to normal sinus rhythm last evening at 6:26 PM  Assessment/Plan:  1. Paroxysmal atrial fibrillation, now in sinus rhythm on IV amiodarone 2. Severe peripheral arterial disease with nonhealing foot ulcer on the left with plans for left AKA 3. Acute on chronic diastolic heart failure/pulmonary hypertension/right heart failure 4. Acute on chronic kidney disease with worsening renal function despite no diuretic administration  I think the patient has stabilized from a cardiac perspective now that she is back in sinus rhythm. She has multiple serious medical problems, but I do not see any areas where we can improve her condition  before surgery. I think the patient should be continued on IV amiodarone perioperatively to avoid postoperative atrial fibrillation. Will continue to follow along with you. She remains on IV heparin for bridging around surgery.  Tonny BollmanMichael Jaonna Word, M.D. 02/11/2014, 10:08 AM

## 2014-02-11 NOTE — Progress Notes (Signed)
NUTRITION FOLLOW UP  INTERVENTION:  Continue Glucerna Shake po BID, each supplement provides 220 kcal and 10 grams of protein RD to follow for nutrition care plan  NUTRITION DIAGNOSIS: Increased nutrient needs related to wound healing as evidenced by estimated nutrition needs, ongoing  Goal: Pt to meet >/= 90% of their estimated nutrition needs   Monitor:  PO & supplemental intake, weight, labs, I/O's  ASSESSMENT: 79 year old female. Patient had her 1 and 2 toe amputated in September since then she conitnued to have non haling ulcer and was admitted to [facility] for 1 week for wound care. On 11/23 she was going to undergo aortogram to evaluate for PVD but developed shortness of breath and procedure was aborted.   [facility] and cardiology have been consulted. She was diuresed and her Cr was monitored. Patient was discharged back to [facility] for rehab and wound care and the plan was for her to get AKA while there but [facility].   Pt continues on a Heart Healthy diet.  PO intake variable at 15-75% per flowsheet records.  Receiving Glucerna Shake supplements.    Palliative Care Team following.  For left AKA when patient is stable from cardiac standpoint.  Height: Ht Readings from Last 1 Encounters:  02/09/14 5\' 6"  (1.676 m)    Weight: Wt Readings from Last 1 Encounters:  02/11/14 180 lb (81.647 kg)    BMI:  Body mass index is 29.07 kg/(m^2).  Estimated Nutritional Needs: Kcal: 1750-1950 Protein: 115-125 g Fluid: 2.0 L/day  Skin: non-healing ulcer on foot, stage I pressure ulcer on sacrum  Diet Order: Diet Heart   Intake/Output Summary (Last 24 hours) at 02/11/14 1457 Last data filed at 02/11/14 1300  Gross per 24 hour  Intake 1191.33 ml  Output     25 ml  Net 1166.33 ml    Labs:   Recent Labs Lab Oct 12, 2013 0515  02/09/14 0017 03/02/2014 0400 02/11/14 0226  NA 143  < > 137 141 137  K 3.9  <  > 3.6* 3.7 3.9  CL 97  < > 94* 98 93*  CO2 32  < > 30 32 31  BUN 40*  < > 40* 38* 47*  CREATININE 1.88*  < > 2.10* 1.89* 2.61*  CALCIUM 9.3  < > 8.5 8.7 8.8  MG 2.0  --   --   --   --   PHOS 4.0  --   --   --   --   GLUCOSE 89  < > 199* 176* 172*  < > = values in this interval not displayed.  CBG (last 3)   Recent Labs  02/11/14 0428 02/11/14 0750 02/11/14 1231  GLUCAP 146* 132* 204*    Scheduled Meds: . amitriptyline  25 mg Oral QHS  . antiseptic oral rinse  7 mL Mouth Rinse BID  . carvedilol  6.25 mg Oral BID WC  . ceFEPime (MAXIPIME) IV  1 g Intravenous Q24H  . darbepoetin (ARANESP) injection - NON-DIALYSIS  60 mcg Subcutaneous Q Wed-1800  . docusate sodium  100 mg Oral BID  . feeding supplement (GLUCERNA SHAKE)  237 mL Oral BID BM  . insulin aspart  0-9 Units Subcutaneous TID WC  . iron polysaccharides  150 mg Oral BID WC  . isosorbide dinitrate  40 mg Oral TID WC  . pantoprazole  40 mg Oral Daily  . sucralfate  1 g Oral TID WC & HS  . [  START ON 02/19/2014] vancomycin  1,000 mg Intravenous Q48H    Continuous Infusions: . amiodarone 30 mg/hr (02/11/14 1426)  . heparin 1,450 Units/hr (02/11/14 1337)    Past Medical History  Diagnosis Date  . Paroxysmal atrial fibrillation     CHADS2 (4). Coumadin Rx  . Hypertension   . Stroke     dysarthria, resolved 2009  . Dyslipidemia   . Peripheral neuropathy   . Amputation, traumatic, toes     two toes right foot  . Cataract 2011  . Ejection fraction     EF 60-65% 09/2007, echo  . Warfarin anticoagulation     Atrial fib  . Vertigo     Vertigo with a fall 03/2012  . IDDM (insulin dependent diabetes mellitus)   . Complication of anesthesia     "11/2013 "got Ativan & Morphine; got wild"  . CHF (congestive heart failure)   . COPD (chronic obstructive pulmonary disease)   . GERD (gastroesophageal reflux disease)   . TIA (transient ischemic attack) 2008   . Arthritis     "fingers, toes, feet, legs, all over I reckon"   . Chronic renal insufficiency   . Kidney stones     "passed them"  . Chronic kidney disease (CKD), stage IV (severe)     Past Surgical History  Procedure Laterality Date  . Appendectomy    . Cholecystectomy    . Toe amputation Right 2009    "big toe; little toe  . Tonsillectomy    . Toe amputation  11/2013    1st and 2nd digits  . Cataract extraction Right     Maureen ChattersKatie Adajah Cocking, RD, LDN Pager #: [phone] After-Hours Pager #: [phone]

## 2014-02-11 NOTE — Progress Notes (Addendum)
ANTICOAGULATION CONSULT NOTE - Follow Up Consult  Pharmacy Consult for Heparin Indication: atrial fibrillation  Allergies  Allergen Reactions  . Ativan [Lorazepam] Other (See Comments)    Unknown  . Morphine And Related Other (See Comments)    MAR  . Xanax Weber CooksXr [Alprazolam Er]     Oversedation     Labs:  Recent Labs  02/09/14 0017 02/09/14 0812 03/02/2014 0400 02/13/2014 1950 02/11/14 0226 02/11/14 1124  HGB 8.2* 8.8* 8.4*  --  7.9*  --   HCT 26.5* 28.7* 28.0*  --  26.4*  --   PLT 174 184 180  --  164  --   LABPROT  --  15.7* 15.4*  --   --   --   INR  --  1.24 1.21  --   --   --   HEPARINUNFRC 0.37 0.50 0.27* 0.25* 0.26* 0.70  CREATININE 2.10*  --  1.89*  --  2.61*  --     Estimated Creatinine Clearance: 19.4 mL/min (by C-G formula based on Cr of 2.61).  Assessment: 79 year old female admitted with non-healing wound on Coumadin PTA for Afib Now transitioned to heparin awaiting surgery Heparin level therapeutic at 0.70  Goal of Therapy:  Heparin level 0.3-0.7 units/ml Monitor platelets by anticoagulation protocol: Yes   Plan:  Decrease heparin  to 1450 units / hr Follow up AM level  Thank you. Okey RegalLisa Annette Bertelson, PharmD [phone]  02/11/2014,1:27 PM

## 2014-02-11 NOTE — Progress Notes (Signed)
   VASCULAR SURGERY ASSESSMENT & PLAN:  * I will reschedule left AKA when the patient is stable from a cardiac standpoint.  *  Currently she is not septic from her left foot wound.  SUBJECTIVE: Confused this morning.  PHYSICAL EXAM: Filed Vitals:   02/08/2014 1205 03/04/2014 1654 02/13/2014 2005 02/11/14 0430  BP: 96/62 108/50 124/73   Pulse:   72   Temp: 98.2 F (36.8 C) 97.6 F (36.4 C) 97.4 F (36.3 C)   TempSrc: Oral Oral Oral   Resp:   21   Height:      Weight:  174 lb 4.8 oz (79.062 kg)  180 lb (81.647 kg)  SpO2:   98%    No cellulitis left leg.  LABS: Lab Results  Component Value Date   WBC 6.4 02/11/2014   HGB 7.9* 02/11/2014   HCT 26.4* 02/11/2014   MCV 89.5 02/11/2014   PLT 164 02/11/2014   Lab Results  Component Value Date   CREATININE 1.89* 03/02/2014   Lab Results  Component Value Date   INR 1.21 02/21/2014   CBG (last 3)   Recent Labs  02/06/2014 2004 02/11/14 0046 02/11/14 0428  GLUCAP 318* 227* 146*    Principal Problem:   Foot ulcer, left Active Problems:   Chronic atrial fibrillation   Essential hypertension   Diabetes mellitus   Warfarin anticoagulation   Chronic diastolic CHF (congestive heart failure)   PAD (peripheral artery disease)   CKD (chronic kidney disease) stage 3, GFR 30-59 ml/min   Foot ulcer   Palliative care encounter   Encephalopathy   CHF (congestive heart failure)   Acute renal failure superimposed on stage 3 chronic kidney disease   PAF (paroxysmal atrial fibrillation)   Pulmonary HTN   Cari CarawayChris Aretta Stetzel Beeper: [phone] 02/11/2014

## 2014-02-12 DIAGNOSIS — E119 Type 2 diabetes mellitus without complications: Secondary | ICD-10-CM

## 2014-02-12 LAB — IRON AND TIBC
Iron: 27 ug/dL — ABNORMAL LOW (ref 42–135)
SATURATION RATIOS: 13 % — AB (ref 20–55)
TIBC: 210 ug/dL — AB (ref 250–470)
UIBC: 183 ug/dL (ref 125–400)

## 2014-02-12 LAB — COMPREHENSIVE METABOLIC PANEL
ALBUMIN: 2.3 g/dL — AB (ref 3.5–5.2)
ALT: 12 U/L (ref 0–35)
ANION GAP: 15 (ref 5–15)
AST: 17 U/L (ref 0–37)
Alkaline Phosphatase: 78 U/L (ref 39–117)
BUN: 52 mg/dL — ABNORMAL HIGH (ref 6–23)
CALCIUM: 8.9 mg/dL (ref 8.4–10.5)
CO2: 26 mEq/L (ref 19–32)
CREATININE: 2.95 mg/dL — AB (ref 0.50–1.10)
Chloride: 87 mEq/L — ABNORMAL LOW (ref 96–112)
GFR calc Af Amer: 17 mL/min — ABNORMAL LOW (ref >90)
GFR calc non Af Amer: 14 mL/min — ABNORMAL LOW (ref >90)
Glucose, Bld: 218 mg/dL — ABNORMAL HIGH (ref 70–99)
Potassium: 4.2 mEq/L (ref 3.7–5.3)
Sodium: 128 mEq/L — ABNORMAL LOW (ref 137–147)
Total Bilirubin: <0.2 mg/dL — ABNORMAL LOW (ref 0.3–1.2)
Total Protein: 6.7 g/dL (ref 6.0–8.3)

## 2014-02-12 LAB — CBC
HCT: 26.1 % — ABNORMAL LOW (ref 36.0–46.0)
Hemoglobin: 8.1 g/dL — ABNORMAL LOW (ref 12.0–15.0)
MCH: 26.9 pg (ref 26.0–34.0)
MCHC: 31 g/dL (ref 30.0–36.0)
MCV: 86.7 fL (ref 78.0–100.0)
Platelets: 171 10*3/uL (ref 150–400)
RBC: 3.01 MIL/uL — ABNORMAL LOW (ref 3.87–5.11)
RDW: 15.8 % — AB (ref 11.5–15.5)
WBC: 7 10*3/uL (ref 4.0–10.5)

## 2014-02-12 LAB — RETICULOCYTES
RBC.: 2.9 MIL/uL — ABNORMAL LOW (ref 3.87–5.11)
RETIC COUNT ABSOLUTE: 63.8 10*3/uL (ref 19.0–186.0)
Retic Ct Pct: 2.2 % (ref 0.4–3.1)

## 2014-02-12 LAB — GLUCOSE, CAPILLARY
GLUCOSE-CAPILLARY: 231 mg/dL — AB (ref 70–99)
Glucose-Capillary: 212 mg/dL — ABNORMAL HIGH (ref 70–99)
Glucose-Capillary: 137 mg/dL — ABNORMAL HIGH (ref 70–99)

## 2014-02-12 LAB — HEPARIN LEVEL (UNFRACTIONATED): Heparin Unfractionated: 0.39 IU/mL (ref 0.30–0.70)

## 2014-02-12 MED ORDER — FUROSEMIDE 10 MG/ML IJ SOLN
40.0000 mg | Freq: Every day | INTRAMUSCULAR | Status: DC
  Administered 2014-02-12: 40 mg via INTRAVENOUS
  Filled 2014-02-12 (×2): qty 4

## 2014-02-12 MED ORDER — INSULIN ASPART 100 UNIT/ML ~~LOC~~ SOLN
0.0000 [IU] | SUBCUTANEOUS | Status: DC
  Administered 2014-02-12 (×2): 5 [IU] via SUBCUTANEOUS
  Administered 2014-02-12 – 2014-02-13 (×3): 2 [IU] via SUBCUTANEOUS
  Administered 2014-02-13: 3 [IU] via SUBCUTANEOUS
  Administered 2014-02-15 (×2): 2 [IU] via SUBCUTANEOUS

## 2014-02-12 MED ORDER — INSULIN GLARGINE 100 UNIT/ML ~~LOC~~ SOLN
5.0000 [IU] | Freq: Every day | SUBCUTANEOUS | Status: DC
  Administered 2014-02-12 – 2014-02-13 (×2): 5 [IU] via SUBCUTANEOUS
  Filled 2014-02-12 (×5): qty 0.05

## 2014-02-12 NOTE — Plan of Care (Signed)
Problem: Phase III Progression Outcomes Goal: Voiding independently Outcome: Completed/Met Date Met:  02/12/14     

## 2014-02-12 NOTE — Progress Notes (Signed)
[name] is sitting up in her chair and beginning to eat breakfast. She says she had nausea this morning but was relieved by zofran and she is ready to eat now. Surgery has been rescheduled for tomorrow and she is anxious to have this over with. No further questions/complaints. I will continue to follow along.   Yong ChannelAlicia Jedrick Hutcherson, NP Palliative Medicine Team Pager # [phone] (M-F 8a-5p) Team Phone # [phone] (Nights/Weekends)

## 2014-02-12 NOTE — Progress Notes (Signed)
Subjective:  No cardiac complaints, up in chair, on O2, reading the paper  Objective:  Vital Signs in the last 24 hours: Temp:  [97.2 F (36.2 C)-97.7 F (36.5 C)] 97.2 F (36.2 C) (12/10 0737) Pulse Rate:  [63-78] 68 (12/10 0737) Resp:  [14-18] 17 (12/10 0737) BP: (133-157)/(42-87) 157/49 mmHg (12/10 0737) SpO2:  [96 %-100 %] 100 % (12/10 0737) Weight:  [194 lb 14.2 oz (88.4 kg)] 194 lb 14.2 oz (88.4 kg) (12/10 0609)  Intake/Output from previous day:  Intake/Output Summary (Last 24 hours) at 02/12/14 1056 Last data filed at 02/12/14 0900  Gross per 24 hour  Intake  944.3 ml  Output    575 ml  Net  369.3 ml    Physical Exam: General appearance: alert, cooperative and no distress Lungs: decreased breath sounds  Heart: regular rate and rhythm and soft systolic murmur   Rate: 70  Rhythm: normal sinus rhythm and 5 bts WCT last night  Lab Results:  Recent Labs  02/11/14 1450 02/12/14 0247  WBC 7.3 7.0  HGB 8.1* 8.1*  PLT 173 171    Recent Labs  02/11/14 0226 02/12/14 0247  NA 137 128*  K 3.9 4.2  CL 93* 87*  CO2 31 26  GLUCOSE 172* 218*  BUN 47* 52*  CREATININE 2.61* 2.95*   No results for input(s): TROPONINI in the last 72 hours.  Invalid input(s): CK, MB  Recent Labs  03/04/2014 0400  INR 1.21    Imaging: Imaging results have been reviewed  Cardiac Studies:  Assessment/Plan:  79 yo female WF PMHx PAF on warfarin, CVA, PVD, CHF, CKD IV, and pulmonary hypertension followed by Dr Myrtis SerKatz. Presented to the ER 11/26/13 from [facility] for an AKA due to a non healing lower extremity wound, with plans for vascular surgery to perform an amputation 12/3. Shortly after admission the patient became very lethargic and surgery was canceled. An ABG showed hypercarbic respiratory failure and the patient was placed on BiPAP and moved to step down unit. Cardiology was consulted for preop clearance. Echo 01/27/14 showed an EF of 45-50% with PA pressure of 65 mmHg. She  has a history of CHF and was diuresed with IV Lasix, though her diuresis was held on 12/7 due to worsening renal failure. On 12/8 the patient was developed A fib w RVR. She was started on IV Amiodarone and has since converted to NSR.    Principal Problem:   Foot ulcer, left Active Problems:   Acute renal failure superimposed on stage 3 chronic kidney disease   PAF (paroxysmal atrial fibrillation)   Diabetes mellitus   Chronic diastolic CHF (congestive heart failure)   PAD (peripheral artery disease)   CKD (chronic kidney disease) stage 3, GFR 30-59 ml/min   Essential hypertension   Warfarin anticoagulation   Pulmonary HTN   PLAN: Cardiac stable- SCr now 2.9- ? Renal consult- she is on Lasix 40 mg IV daily. Surgery scheduled for tomorrow.   Corine ShelterLuke Kilroy PA-C Beeper [phone] 02/12/2014, 10:56 AM  Patient seen, examined. Available data reviewed. Agree with findings, assessment, and plan as outlined by Corine ShelterLuke Kilroy, PA-C. The patient was independently interviewed and examined. She is sitting up in a chair doing a crossword puzzle. Her lungs show diminished air movement throughout. Heart sounds are distant and regular. Telemetry shows sinus rhythm without significant arrhythmia.  Plan as outlined above. While her weight is increasing, her creatinine is trending upward and I think Lasix should be held until we see  her creatinine plateau. Will continue IV amiodarone until she is through surgery, then convert to oral amiodarone.   Tonny BollmanMichael Lewis Keats, M.D. 02/12/2014 1:55 PM

## 2014-02-12 NOTE — Progress Notes (Signed)
ANTICOAGULATION CONSULT NOTE - Follow Up Consult  Pharmacy Consult for Heparin Indication: atrial fibrillation  Allergies  Allergen Reactions  . Ativan [Lorazepam] Other (See Comments)    Unknown  . Morphine And Related Other (See Comments)    MAR  . Xanax Weber CooksXr [Alprazolam Er]     Oversedation     Patient Measurements: Height: 5\' 6"  (167.6 cm) Weight: 194 lb 14.2 oz (88.4 kg) IBW/kg (Calculated) : 59.3 Heparin Dosing Weight: 78 kg  Vital Signs: Temp: 97.2 F (36.2 C) (12/10 0737) Temp Source: Oral (12/10 0737) BP: 157/49 mmHg (12/10 0737) Pulse Rate: 68 (12/10 0737)  Labs:  Recent Labs  02/15/2014 0400  02/11/14 0226 02/11/14 1124 02/11/14 1450 02/12/14 0247  HGB 8.4*  --  7.9*  --  8.1* 8.1*  HCT 28.0*  --  26.4*  --  26.0* 26.1*  PLT 180  --  164  --  173 171  LABPROT 15.4*  --   --   --   --   --   INR 1.21  --   --   --   --   --   HEPARINUNFRC 0.27*  < > 0.26* 0.70  --  0.39  CREATININE 1.89*  --  2.61*  --   --  2.95*  < > = values in this interval not displayed.  Estimated Creatinine Clearance: 17.9 mL/min (by C-G formula based on Cr of 2.95).   Medications:  Infusions:  . amiodarone 30 mg/hr (02/11/14 1426)  . heparin 1,450 Units/hr (02/11/14 1337)    Assessment: 79 year old female on chronic anticoagulation for atrial fibrillation.  Her Coumadin is on hold for AKA that is scheduled for 12/11 and she is being bridged with Heparin.  Her heparin level is therapeutic.  No bleeding noted, Hgb is stable.  Goal of Therapy:  Heparin level 0.3-0.7 units/ml Monitor platelets by anticoagulation protocol: Yes   Plan:  Continue Heparin at 1450 units/hr Note heparin stops at 0800 on 12/11 for surgery Will follow for anticoagulation plans after surgery  Kelli HuskMichelle Alexandre [name], Pharm.D., BCPS, AAHIVP Clinical Pharmacist Phone: [phone] or [phone] 02/12/2014, 8:59 AM

## 2014-02-12 NOTE — Progress Notes (Signed)
No current orders for surgery but pt NPO after midnight with possibility of surgery.

## 2014-02-12 NOTE — Progress Notes (Signed)
[facility] TEAM 1 - Stepdown/ICU TEAM Progress Note  [name] XJD:[id number] DOB: May 28, 1934 DOA: 02/15/2014 PCP: Manon Hilding, MD  Admit HPI / Brief Narrative: 79 yo female WF PMHx afib on warfarin, CVA, PVD, CHF, CKD IV, and pulmonary hypertension Presented to the ER from [facility] for an AKA due to a non healing lower extremity wound, with plans for vascular surgery to perform an amputation 12/3. Shortly after admission the patient became very lethargic therefore surgery was canceled. An ABG showed hypercarbic respiratory failure and the patient was placed on BiPAP and moved to step down unit. She was started on broad-spectrum antibiotics for the lower extremity wound. Her mental status improved significantly and she returned to baseline on 12/5. Cardiology was consulted for preop clearance and they have been following patient while hospitalized. She has a history of CHF and was diuresed with IV Lasix, and her diuresis was on hold on 12/7 due to worsening renal failure. On 12/8 the patient was again lethargic and developed A fib w RVR.   HPI/Subjective: 12/10 A/O 4, negative lower extremity pain, positive N/V, negative SOB, negative CP.  Assessment/Plan: Acute intermittent encephalopathy -Resolved  -V61 and folic acid levels  pending  PVD with non healing wound L foot  -For AKA per Vascular Surgery; schedule for 12/11  - continue vancomycin and cefepime for presumed underlying infection and will keep on antibiotics until surgery   Parox Afib with RVR  -On heparin gtt, per cardiology  -Continue amiodarone drip   Acute on chronic combined systolic and diastolic heart failure -2D echo results below - Continue Coreg 6.25 mg BID -isordil 40 mg  TID -Start Lasix 40 mg daily  Acute renal failure on Chronic Kidney disease stage IV -Baseline creatinine ~2.2  -Cr slightly increased at 2.95 my: Trending up likely due to decreased perfusion in setting of afib w/ RVR -Daily weight  Admission weight=  81.2 kg               12/10 weight= 88.4 kg -Strict in and out  + 2.4 L  HTN -See acute on chronic combined systolic and diastolic heart failure     Normocytic anemia -Likely anemia of chronic kidney disease exacerbated by the smoldering L foot wound - transfuse as needed to keep Hgb 8.0 or > (no known hx of CAD but TTE suggestive of possible CAD in pt w/ known PVD)  - check anemia panel   Hyponatremia -Most likely secondary to heart failure   Bilateral Pleural Effusions with chronic mild pulmonary edema -Lasix per Cardiology - f/u CXR in AM   DM II  -Not well controlled CBGs reasonably well controlled  increase SSI to moderate -Start Lantus 5 units daily  -12/2 Hemoglobin A1c= A1C 6.9  Goals of care Palliative was consulted and they have met with the patient and the family. The patient's mental status recovered to baseline and she expressed her wishes to continue to be full code and to continue with the surgery.    Code Status: FULL  Family Communication: daughter in law at bedside Disposition Plan: SDU    Code Status: FULL Family Communication: no family present at time of exam Disposition Plan:  SNF vs CIR   Consultants: Dr. Deitra Mayo (Vascular) Dr. Sherren Mocha (Cardiology)   Procedure/Significant Events: 2D Echo LVEF= 45% to 50%. There is akinesis of the mid-apicalanteroseptal myocardium.  -(grade 2 diastolic dysfunction)    Culture 12/3 MRSA by PCR negative   Antibiotics: Vancomycin 12/3 >> Cefepime  12/3 >>   DVT prophylaxis: Lovenox   Devices NA   LINES / TUBES:      Continuous Infusions: . amiodarone 30 mg/hr (02/11/14 1426)  . heparin 1,450 Units/hr (02/11/14 1337)    Objective: VITAL SIGNS: Temp: 97.2 F (36.2 C) (12/10 0737) Temp Source: Oral (12/10 0737) BP: 157/49 mmHg (12/10 0737) Pulse Rate: 68 (12/10 0737) SPO2; FIO2:   Intake/Output Summary (Last 24 hours) at 02/12/14 0804 Last  data filed at 02/12/14 8099  Gross per 24 hour  Intake 1187.7 ml  Output    575 ml  Net  612.7 ml     Exam: General:  A/O 4, NAD,No acute respiratory distress Lungs: Clear to auscultation bilaterally without wheezes or crackles Cardiovascular: Regular rate and rhythm without murmur gallop or rub normal S1 and S2 Abdomen: Nontender, nondistended, soft, bowel sounds positive, no rebound, no ascites, no appreciable mass Extremities: No significant cyanosis, clubbing. Bilateral lower extremity edema 2-3+  Lt> Rt   Data Reviewed: Basic Metabolic Panel:  Recent Labs Lab 02/08/14 0403 02/09/14 0017 03/03/2014 0400 02/11/14 0226 02/12/14 0247  NA 139 137 141 137 128*  K 3.7 3.6* 3.7 3.9 4.2  CL 96 94* 98 93* 87*  CO2 31 30 32 31 26  GLUCOSE 97 199* 176* 172* 218*  BUN 40* 40* 38* 47* 52*  CREATININE 2.23* 2.10* 1.89* 2.61* 2.95*  CALCIUM 8.8 8.5 8.7 8.8 8.9   Liver Function Tests:  Recent Labs Lab 02/07/14 0407 02/11/14 0226 02/12/14 0247  AST _0 ALT _1 ALKPHOS 82 75 78  BILITOT 0.3 <0.2* <0.2*  PROT 6.6 6.4 6.7  ALBUMIN 2.5* 2.3* 2.3*   No results for input(s): LIPASE, AMYLASE in the last 168 hours.  Recent Labs Lab 02/06/14 0320  AMMONIA 26   CBC:  Recent Labs Lab 02/09/14 0812 02/23/2014 0400 02/11/14 0226 02/11/14 1450 02/12/14 0247  WBC 5.4 9.0 6.4 7.3 7.0  HGB 8.8* 8.4* 7.9* 8.1* 8.1*  HCT 28.7* 28.0* 26.4* 26.0* 26.1*  MCV 87.8 88.9 89.5 90.3 86.7  PLT 184 180 164 173 171   Cardiac Enzymes: No results for input(s): CKTOTAL, CKMB, CKMBINDEX, TROPONINI in the last 168 hours. BNP (last 3 results)  Recent Labs  02/06/14 0320  PROBNP 38544.0*   CBG:  Recent Labs Lab 02/11/14 0428 02/11/14 0750 02/11/14 1231 02/11/14 1600 02/11/14 2104  GLUCAP 146* 132* 204* 248* 278*    Recent Results (from the past 240 hour(s))  Surgical pcr screen     Status: None   Collection Time: 03/05/2014 12:17 AM  Result Value Ref Range Status     MRSA, PCR NEGATIVE NEGATIVE Final   Staphylococcus aureus NEGATIVE NEGATIVE Final    Comment:        The Xpert SA Assay (FDA approved for NASAL specimens in patients over 17 years of age), is one component of a comprehensive surveillance program.  Test performance has been validated by [employer] for patients greater than or equal to 59 year old. It is not intended to diagnose infection nor to guide or monitor treatment.      Studies:  Recent x-ray studies have been reviewed in detail by the Attending Physician  Scheduled Meds:  Scheduled Meds: . amitriptyline  25 mg Oral QHS  . antiseptic oral rinse  7 mL Mouth Rinse BID  . carvedilol  6.25 mg Oral BID WC  . ceFEPime (MAXIPIME) IV  1 g Intravenous Q24H  . darbepoetin (ARANESP) injection -  NON-DIALYSIS  60 mcg Subcutaneous Q Wed-1800  . docusate sodium  100 mg Oral BID  . feeding supplement (GLUCERNA SHAKE)  237 mL Oral BID BM  . insulin aspart  0-9 Units Subcutaneous TID WC  . iron polysaccharides  150 mg Oral BID WC  . isosorbide dinitrate  40 mg Oral TID WC  . pantoprazole  40 mg Oral Daily  . sucralfate  1 g Oral TID WC & HS  . [START ON 02/08/2014] vancomycin  1,000 mg Intravenous Q48H    Time spent on care of this patient: 40 mins   Allie Bossier , MD   [facility] Office  [phone] Pager - [phone]  On-Call/Text Page:      [email or web address]      password [email or web address]  If 7PM-7AM, please contact night-coverage [email or web address] Password [email or web address] 02/12/2014, 8:04 AM   LOS: 8 days

## 2014-02-12 NOTE — Progress Notes (Signed)
   VASCULAR SURGERY ASSESSMENT & PLAN:  * I have rescheduled her for a left BKA tomorrow. Will holp heparin at 8 AM tomorrow.   * Continue IV ABx.  * Discussed with patient and I will try to call her son Randy today.   SUBJECTIVE: Much more alert today.  PHYSICAL EXAM: Filed Vitals:   02/11/14 2100 02/12/14 0030 02/12/14 0420 02/12/14 0609  BP: 151/42 138/49 153/49   Pulse: 70 63 66   Temp: 97.2 F (36.2 C) 97.4 F (36.3 C) 97.3 F (36.3 C)   TempSrc: Oral Oral Oral   Resp: 18 14 14   Height:    5' 6" (1.676 m)  Weight:    194 lb 14.2 oz (88.4 kg)  SpO2: 100% 96% 99%    Dressing left foot with no drainage.  LABS: Lab Results  Component Value Date   WBC 7.0 02/12/2014   HGB 8.1* 02/12/2014   HCT 26.1* 02/12/2014   MCV 86.7 02/12/2014   PLT 171 02/12/2014   Lab Results  Component Value Date   CREATININE 2.95* 02/12/2014   Lab Results  Component Value Date   INR 1.21 02/20/2014   CBG (last 3)   Recent Labs  02/11/14 1231 02/11/14 1600 02/11/14 2104  GLUCAP 204* 248* 278*    Principal Problem:   Foot ulcer, left Active Problems:   Chronic atrial fibrillation   Essential hypertension   Diabetes mellitus   Warfarin anticoagulation   Chronic diastolic CHF (congestive heart failure)   PAD (peripheral artery disease)   CKD (chronic kidney disease) stage 3, GFR 30-59 ml/min   Foot ulcer   Palliative care encounter   Encephalopathy   CHF (congestive heart failure)   Acute renal failure superimposed on stage 3 chronic kidney disease   PAF (paroxysmal atrial fibrillation)   Pulmonary HTN   Chris Eean Buss Beeper: [phone] 02/12/2014    

## 2014-02-13 ENCOUNTER — Encounter (HOSPITAL_COMMUNITY): Payer: Self-pay | Admitting: Certified Registered Nurse Anesthetist

## 2014-02-13 ENCOUNTER — Inpatient Hospital Stay (HOSPITAL_COMMUNITY): Payer: Medicare Other

## 2014-02-13 ENCOUNTER — Inpatient Hospital Stay (HOSPITAL_COMMUNITY): Payer: Medicare Other | Admitting: Anesthesiology

## 2014-02-13 ENCOUNTER — Encounter (HOSPITAL_COMMUNITY): Admission: AD | Disposition: E | Payer: Self-pay | Source: Other Acute Inpatient Hospital | Attending: Internal Medicine

## 2014-02-13 HISTORY — PX: AMPUTATION: SHX166

## 2014-02-13 LAB — COMPREHENSIVE METABOLIC PANEL
ALT: 11 U/L (ref 0–35)
AST: 15 U/L (ref 0–37)
Albumin: 2.3 g/dL — ABNORMAL LOW (ref 3.5–5.2)
Alkaline Phosphatase: 80 U/L (ref 39–117)
Anion gap: 16 — ABNORMAL HIGH (ref 5–15)
BUN: 61 mg/dL — ABNORMAL HIGH (ref 6–23)
CHLORIDE: 88 meq/L — AB (ref 96–112)
CO2: 27 meq/L (ref 19–32)
Calcium: 9 mg/dL (ref 8.4–10.5)
Creatinine, Ser: 3.32 mg/dL — ABNORMAL HIGH (ref 0.50–1.10)
GFR, EST AFRICAN AMERICAN: 14 mL/min — AB (ref >90)
GFR, EST NON AFRICAN AMERICAN: 12 mL/min — AB (ref >90)
GLUCOSE: 156 mg/dL — AB (ref 70–99)
Potassium: 4.5 mEq/L (ref 3.7–5.3)
Sodium: 131 mEq/L — ABNORMAL LOW (ref 137–147)
Total Protein: 6.3 g/dL (ref 6.0–8.3)

## 2014-02-13 LAB — CBC WITH DIFFERENTIAL/PLATELET
BASOS ABS: 0 10*3/uL (ref 0.0–0.1)
BASOS PCT: 1 % (ref 0–1)
EOS PCT: 3 % (ref 0–5)
Eosinophils Absolute: 0.2 10*3/uL (ref 0.0–0.7)
HCT: 24.6 % — ABNORMAL LOW (ref 36.0–46.0)
Hemoglobin: 7.7 g/dL — ABNORMAL LOW (ref 12.0–15.0)
Lymphocytes Relative: 15 % (ref 12–46)
Lymphs Abs: 0.9 10*3/uL (ref 0.7–4.0)
MCH: 26.7 pg (ref 26.0–34.0)
MCHC: 31.3 g/dL (ref 30.0–36.0)
MCV: 85.4 fL (ref 78.0–100.0)
Monocytes Absolute: 0.6 10*3/uL (ref 0.1–1.0)
Monocytes Relative: 10 % (ref 3–12)
Neutro Abs: 4.3 10*3/uL (ref 1.7–7.7)
Neutrophils Relative %: 71 % (ref 43–77)
PLATELETS: 167 10*3/uL (ref 150–400)
RBC: 2.88 MIL/uL — ABNORMAL LOW (ref 3.87–5.11)
RDW: 15.6 % — AB (ref 11.5–15.5)
WBC: 6 10*3/uL (ref 4.0–10.5)

## 2014-02-13 LAB — GLUCOSE, CAPILLARY
GLUCOSE-CAPILLARY: 129 mg/dL — AB (ref 70–99)
GLUCOSE-CAPILLARY: 133 mg/dL — AB (ref 70–99)
Glucose-Capillary: 112 mg/dL — ABNORMAL HIGH (ref 70–99)
Glucose-Capillary: 121 mg/dL — ABNORMAL HIGH (ref 70–99)
Glucose-Capillary: 170 mg/dL — ABNORMAL HIGH (ref 70–99)
Glucose-Capillary: 175 mg/dL — ABNORMAL HIGH (ref 70–99)

## 2014-02-13 LAB — MAGNESIUM: MAGNESIUM: 2.2 mg/dL (ref 1.5–2.5)

## 2014-02-13 LAB — VITAMIN B12: Vitamin B-12: 611 pg/mL (ref 211–911)

## 2014-02-13 LAB — HEPARIN LEVEL (UNFRACTIONATED): HEPARIN UNFRACTIONATED: 0.43 [IU]/mL (ref 0.30–0.70)

## 2014-02-13 LAB — FOLATE: FOLATE: 11.2 ng/mL

## 2014-02-13 LAB — BLOOD PRODUCT ORDER (VERBAL) VERIFICATION

## 2014-02-13 SURGERY — AMPUTATION, ABOVE KNEE
Anesthesia: General | Site: Leg Upper | Laterality: Left

## 2014-02-13 MED ORDER — PROPOFOL 10 MG/ML IV BOLUS
INTRAVENOUS | Status: DC | PRN
  Administered 2014-02-13: 20 mg via INTRAVENOUS
  Administered 2014-02-13: 100 mg via INTRAVENOUS

## 2014-02-13 MED ORDER — ARTIFICIAL TEARS OP OINT
TOPICAL_OINTMENT | OPHTHALMIC | Status: DC | PRN
  Administered 2014-02-13: 1 via OPHTHALMIC

## 2014-02-13 MED ORDER — PROPOFOL 10 MG/ML IV BOLUS
INTRAVENOUS | Status: AC
  Filled 2014-02-13: qty 20

## 2014-02-13 MED ORDER — GUAIFENESIN-DM 100-10 MG/5ML PO SYRP
15.0000 mL | ORAL_SOLUTION | ORAL | Status: DC | PRN
  Filled 2014-02-13: qty 15

## 2014-02-13 MED ORDER — 0.9 % SODIUM CHLORIDE (POUR BTL) OPTIME
TOPICAL | Status: DC | PRN
  Administered 2014-02-13: 1000 mL

## 2014-02-13 MED ORDER — SODIUM CHLORIDE 0.9 % IV SOLN
INTRAVENOUS | Status: DC
  Administered 2014-02-13 (×2): via INTRAVENOUS
  Administered 2014-02-15: 20 mL/h via INTRAVENOUS

## 2014-02-13 MED ORDER — EPHEDRINE SULFATE 50 MG/ML IJ SOLN
INTRAMUSCULAR | Status: DC | PRN
  Administered 2014-02-13 (×2): 5 mg via INTRAVENOUS
  Administered 2014-02-13: 10 mg via INTRAVENOUS
  Administered 2014-02-13: 5 mg via INTRAVENOUS

## 2014-02-13 MED ORDER — METOPROLOL TARTRATE 1 MG/ML IV SOLN
2.0000 mg | INTRAVENOUS | Status: DC | PRN
Start: 2014-02-13 — End: 2014-02-15

## 2014-02-13 MED ORDER — OXYCODONE HCL 5 MG/5ML PO SOLN
5.0000 mg | Freq: Once | ORAL | Status: DC | PRN
Start: 2014-02-13 — End: 2014-02-13

## 2014-02-13 MED ORDER — GLYCOPYRROLATE 0.2 MG/ML IJ SOLN
INTRAMUSCULAR | Status: AC
  Filled 2014-02-13: qty 4

## 2014-02-13 MED ORDER — BACITRACIN ZINC 500 UNIT/GM EX OINT
TOPICAL_OINTMENT | CUTANEOUS | Status: AC
  Filled 2014-02-13: qty 15

## 2014-02-13 MED ORDER — ROCURONIUM BROMIDE 50 MG/5ML IV SOLN
INTRAVENOUS | Status: AC
  Filled 2014-02-13: qty 1

## 2014-02-13 MED ORDER — MAGNESIUM SULFATE 2 GM/50ML IV SOLN
2.0000 g | Freq: Once | INTRAVENOUS | Status: DC | PRN
  Filled 2014-02-13: qty 50

## 2014-02-13 MED ORDER — ALUM & MAG HYDROXIDE-SIMETH 200-200-20 MG/5ML PO SUSP
15.0000 mL | ORAL | Status: DC | PRN
  Filled 2014-02-13: qty 30

## 2014-02-13 MED ORDER — MIDAZOLAM HCL 2 MG/2ML IJ SOLN
INTRAMUSCULAR | Status: AC
  Filled 2014-02-13: qty 2

## 2014-02-13 MED ORDER — PHENOL 1.4 % MT LIQD
1.0000 | OROMUCOSAL | Status: DC | PRN
  Filled 2014-02-13: qty 177

## 2014-02-13 MED ORDER — HYDROMORPHONE HCL 1 MG/ML IJ SOLN
0.5000 mg | INTRAMUSCULAR | Status: DC | PRN
  Administered 2014-02-13 – 2014-02-14 (×5): 1 mg via INTRAVENOUS
  Filled 2014-02-13 (×5): qty 1

## 2014-02-13 MED ORDER — LIDOCAINE HCL (CARDIAC) 20 MG/ML IV SOLN
INTRAVENOUS | Status: DC | PRN
  Administered 2014-02-13: 60 mg via INTRAVENOUS

## 2014-02-13 MED ORDER — AMIODARONE HCL IN DEXTROSE 360-4.14 MG/200ML-% IV SOLN
INTRAVENOUS | Status: DC | PRN
  Administered 2014-02-13: 30 mg/h via INTRAVENOUS

## 2014-02-13 MED ORDER — HYDROMORPHONE HCL 1 MG/ML IJ SOLN: 0.2500 mg | INTRAMUSCULAR | Status: DC | PRN

## 2014-02-13 MED ORDER — OXYCODONE-ACETAMINOPHEN 5-325 MG PO TABS: 1.0000 | ORAL_TABLET | ORAL | Status: DC | PRN

## 2014-02-13 MED ORDER — BACITRACIN ZINC 500 UNIT/GM EX OINT
TOPICAL_OINTMENT | CUTANEOUS | Status: DC | PRN
  Administered 2014-02-13: 1 via TOPICAL

## 2014-02-13 MED ORDER — FENTANYL CITRATE 0.05 MG/ML IJ SOLN
INTRAMUSCULAR | Status: AC
  Filled 2014-02-13: qty 5

## 2014-02-13 MED ORDER — ONDANSETRON HCL 4 MG/2ML IJ SOLN
INTRAMUSCULAR | Status: AC
  Filled 2014-02-13: qty 2

## 2014-02-13 MED ORDER — POTASSIUM CHLORIDE CRYS ER 20 MEQ PO TBCR: 20.0000 meq | EXTENDED_RELEASE_TABLET | Freq: Once | ORAL | Status: AC | PRN

## 2014-02-13 MED ORDER — LIDOCAINE HCL (CARDIAC) 20 MG/ML IV SOLN
INTRAVENOUS | Status: AC
  Filled 2014-02-13: qty 5

## 2014-02-13 MED ORDER — LABETALOL HCL 5 MG/ML IV SOLN: 10.0000 mg | INTRAVENOUS | Status: DC | PRN

## 2014-02-13 MED ORDER — HEPARIN (PORCINE) IN NACL 100-0.45 UNIT/ML-% IJ SOLN
1550.0000 [IU]/h | INTRAMUSCULAR | Status: DC
  Administered 2014-02-13: 1450 [IU]/h via INTRAVENOUS
  Administered 2014-02-14 – 2014-02-15 (×2): 1550 [IU]/h via INTRAVENOUS
  Filled 2014-02-13 (×5): qty 250

## 2014-02-13 MED ORDER — OXYCODONE HCL 5 MG PO TABS
5.0000 mg | ORAL_TABLET | Freq: Once | ORAL | Status: DC | PRN
Start: 2014-02-13 — End: 2014-02-13

## 2014-02-13 MED ORDER — SUCCINYLCHOLINE CHLORIDE 20 MG/ML IJ SOLN
INTRAMUSCULAR | Status: DC | PRN
  Administered 2014-02-13: 40 mg via INTRAVENOUS

## 2014-02-13 MED ORDER — FENTANYL CITRATE 0.05 MG/ML IJ SOLN
INTRAMUSCULAR | Status: DC | PRN
  Administered 2014-02-13: 75 ug via INTRAVENOUS
  Administered 2014-02-13: 50 ug via INTRAVENOUS

## 2014-02-13 MED ORDER — ONDANSETRON HCL 4 MG/2ML IJ SOLN
INTRAMUSCULAR | Status: DC | PRN
  Administered 2014-02-13: 4 mg via INTRAVENOUS

## 2014-02-13 MED ORDER — HYDRALAZINE HCL 20 MG/ML IJ SOLN: 5.0000 mg | INTRAMUSCULAR | Status: DC | PRN

## 2014-02-13 MED ORDER — NEOSTIGMINE METHYLSULFATE 10 MG/10ML IV SOLN
INTRAVENOUS | Status: AC
  Filled 2014-02-13: qty 1

## 2014-02-13 SURGICAL SUPPLY — 55 items
BANDAGE ELASTIC 4 VELCRO ST LF (GAUZE/BANDAGES/DRESSINGS) ×3 IMPLANT
BANDAGE ELASTIC 6 VELCRO ST LF (GAUZE/BANDAGES/DRESSINGS) ×3 IMPLANT
BANDAGE ESMARK 6X9 LF (GAUZE/BANDAGES/DRESSINGS) IMPLANT
BLADE SAW RECIP 87.9 MT (BLADE) ×3 IMPLANT
BNDG CMPR 9X6 STRL LF SNTH (GAUZE/BANDAGES/DRESSINGS)
BNDG COHESIVE 6X5 TAN STRL LF (GAUZE/BANDAGES/DRESSINGS) ×3 IMPLANT
BNDG ESMARK 6X9 LF (GAUZE/BANDAGES/DRESSINGS)
BNDG GAUZE ELAST 4 BULKY (GAUZE/BANDAGES/DRESSINGS) ×3 IMPLANT
CANISTER SUCTION 2500CC (MISCELLANEOUS) ×3 IMPLANT
CLIP TI MEDIUM 6 (CLIP) ×2 IMPLANT
COVER SURGICAL LIGHT HANDLE (MISCELLANEOUS) ×3 IMPLANT
CUFF TOURNIQUET SINGLE 18IN (TOURNIQUET CUFF) IMPLANT
CUFF TOURNIQUET SINGLE 24IN (TOURNIQUET CUFF) IMPLANT
CUFF TOURNIQUET SINGLE 34IN LL (TOURNIQUET CUFF) IMPLANT
CUFF TOURNIQUET SINGLE 44IN (TOURNIQUET CUFF) IMPLANT
DRAIN CHANNEL 19F RND (DRAIN) IMPLANT
DRAPE ORTHO SPLIT 77X108 STRL (DRAPES) ×6
DRAPE PROXIMA HALF (DRAPES) ×3 IMPLANT
DRAPE SURG ORHT 6 SPLT 77X108 (DRAPES) ×2 IMPLANT
DRAPE U-SHAPE 47X51 STRL (DRAPES) ×3 IMPLANT
DRSG ADAPTIC 3X8 NADH LF (GAUZE/BANDAGES/DRESSINGS) ×3 IMPLANT
ELECT REM PT RETURN 9FT ADLT (ELECTROSURGICAL) ×3
ELECTRODE REM PT RTRN 9FT ADLT (ELECTROSURGICAL) ×1 IMPLANT
EVACUATOR SILICONE 100CC (DRAIN) IMPLANT
GAUZE SPONGE 4X4 12PLY STRL (GAUZE/BANDAGES/DRESSINGS) ×1 IMPLANT
GLOVE BIO SURGEON STRL SZ7.5 (GLOVE) ×3 IMPLANT
GLOVE BIOGEL PI IND STRL 6.5 (GLOVE) IMPLANT
GLOVE BIOGEL PI IND STRL 8 (GLOVE) ×1 IMPLANT
GLOVE BIOGEL PI INDICATOR 6.5 (GLOVE) ×2
GLOVE BIOGEL PI INDICATOR 8 (GLOVE) ×2
GLOVE SURG SS PI 6.0 STRL IVOR (GLOVE) ×2 IMPLANT
GLOVE SURG SS PI 7.0 STRL IVOR (GLOVE) ×4 IMPLANT
GOWN STRL REUS W/ TWL LRG LVL3 (GOWN DISPOSABLE) ×3 IMPLANT
GOWN STRL REUS W/ TWL XL LVL3 (GOWN DISPOSABLE) IMPLANT
GOWN STRL REUS W/TWL LRG LVL3 (GOWN DISPOSABLE) ×9
GOWN STRL REUS W/TWL XL LVL3 (GOWN DISPOSABLE) ×6
KIT BASIN OR (CUSTOM PROCEDURE TRAY) ×3 IMPLANT
KIT ROOM TURNOVER OR (KITS) ×3 IMPLANT
NS IRRIG 1000ML POUR BTL (IV SOLUTION) ×3 IMPLANT
PACK GENERAL/GYN (CUSTOM PROCEDURE TRAY) ×3 IMPLANT
PAD ARMBOARD 7.5X6 YLW CONV (MISCELLANEOUS) ×6 IMPLANT
PADDING CAST COTTON 6X4 STRL (CAST SUPPLIES) IMPLANT
SPONGE GAUZE 4X4 12PLY STER LF (GAUZE/BANDAGES/DRESSINGS) ×2 IMPLANT
STAPLER VISISTAT (STAPLE) ×3 IMPLANT
STOCKINETTE IMPERVIOUS LG (DRAPES) ×3 IMPLANT
SUT ETHILON 3 0 PS 1 (SUTURE) IMPLANT
SUT SILK 0 TIES 10X30 (SUTURE) ×3 IMPLANT
SUT SILK 2 0 (SUTURE)
SUT SILK 2 0 SH CR/8 (SUTURE) ×5 IMPLANT
SUT SILK 2-0 18XBRD TIE 12 (SUTURE) ×1 IMPLANT
SUT SILK 3 0 (SUTURE)
SUT SILK 3-0 18XBRD TIE 12 (SUTURE) ×1 IMPLANT
SUT VIC AB 2-0 CT1 18 (SUTURE) ×5 IMPLANT
UNDERPAD 30X30 INCONTINENT (UNDERPADS AND DIAPERS) ×3 IMPLANT
WATER STERILE IRR 1000ML POUR (IV SOLUTION) ×3 IMPLANT

## 2014-02-13 NOTE — Anesthesia Preprocedure Evaluation (Signed)
Anesthesia Evaluation  Patient identified by MRN, date of birth, ID band Patient awake    Reviewed: Allergy & Precautions, H&P , NPO status , Patient's Chart, lab work & pertinent test results  History of Anesthesia Complications Negative for: history of anesthetic complications  Airway Mallampati: I  TM Distance: >3 FB Neck ROM: Full    Dental  (+) Edentulous Upper, Edentulous Lower   Pulmonary shortness of breath and at rest, COPD COPD inhaler and oxygen dependent,  breath sounds clear to auscultation        Cardiovascular hypertension, Pt. on medications and Pt. on home beta blockers - angina+ Peripheral Vascular Disease and +CHF + dysrhythmias Rhythm:Regular     Neuro/Psych TIA Neuromuscular disease CVA, No Residual Symptoms negative psych ROS   GI/Hepatic Neg liver ROS, GERD-  Medicated and Controlled,  Endo/Other  diabetes, Type 2, Insulin Dependent  Renal/GU CRFRenal disease     Musculoskeletal  (+) Arthritis -,   Abdominal   Peds  Hematology   Anesthesia Other Findings   Reproductive/Obstetrics                             Anesthesia Physical Anesthesia Plan  ASA: III  Anesthesia Plan: General   Post-op Pain Management:    Induction: Intravenous  Airway Management Planned: Oral ETT  Additional Equipment: None  Intra-op Plan:   Post-operative Plan: Extubation in OR and Possible Post-op intubation/ventilation  Informed Consent: I have reviewed the patients History and Physical, chart, labs and discussed the procedure including the risks, benefits and alternatives for the proposed anesthesia with the patient or authorized representative who has indicated his/her understanding and acceptance.   Dental advisory given  Plan Discussed with: CRNA and Surgeon  Anesthesia Plan Comments:         Anesthesia Quick Evaluation

## 2014-02-13 NOTE — Anesthesia Procedure Notes (Signed)
Procedure Name: Intubation Date/Time: 02/04/2014 1:14 PM Performed by: Sarita HaverFLOWERS, Evonna Stoltz T Pre-anesthesia Checklist: Patient identified, Timeout performed, Emergency Drugs available, Suction available and Patient being monitored Patient Re-evaluated:Patient Re-evaluated prior to inductionOxygen Delivery Method: Circle system utilized and Simple face mask Preoxygenation: Pre-oxygenation with 100% oxygen Intubation Type: IV induction and Rapid sequence Laryngoscope Size: Miller and 2 Grade View: Grade I Tube type: Oral Tube size: 7.0 mm Number of attempts: 1 Airway Equipment and Method: Patient positioned with wedge pillow and Stylet Placement Confirmation: ETT inserted through vocal cords under direct vision,  positive ETCO2 and breath sounds checked- equal and bilateral Secured at: 21 cm Tube secured with: Tape Dental Injury: Teeth and Oropharynx as per pre-operative assessment

## 2014-02-13 NOTE — Progress Notes (Signed)
[facility] TEAM 1 - Stepdown/ICU TEAM  [name] BWL:[id number] DOB: March 12, 1934 DOA: 02/26/2014 PCP: Manon Hilding, MD  HPI 79 yo female with hx of afib on warfarin, CVA, PVD, CHF, CKD IV, and pulmonary hypertension who presented to the ER from [facility] for an AKA due to a non healing lower extremity wound, with plans for vascular surgery to perform an amputation 12/3. Shortly after admission the patient became very lethargic therefore surgery was canceled. An ABG showed hypercarbic respiratory failure and the patient was placed on BiPAP and moved to step down unit. She was started on broad-spectrum antibiotics for the lower extremity wound. Her mental status improved significantly and she returned to baseline on 12/5. Cardiology was consulted for preop clearance and they have been following patient while hospitalized. She has a history of CHF and was diuresed with IV Lasix, and her diuresis was on hold on 12/7 due to worsening renal failure. On 12/8 the patient was again lethargic and developed A fib w RVR.   Subjective:   Pt is seen post-op.  She is somewhat lethargic following pain medications.  Her son at the bedside states she was alert and conversant prior to her surgery.    Assessment/Plan:  Acute intermittent encephalopathy G81 and folic acid levels normal - mental status appears to have returned to baseline   PVD with non healing wound L foot   Now s/p AKA per Vascular Surgery - post-op care per surgery  Parox Afib with RVR  On heparin gtt - appreciate Cardiology input - now on amio gtt - rate well controlled   Acute renal failure on Chronic Kidney disease stage IV Baseline creatinine ~2.2 - crt has increased, likely due to decreased perfusion in setting of afib w/ RVR - follow trend   Normocytic anemia Likely anemia of chronic kidney disease exacerbated by the smoldering L foot wound - transfuse as needed to keep Hgb 8.0 or > (no known hx of CAD but TTE suggestive of possible  CAD in pt w/ known PVD) - getting 1u PRBC today   Bilateral Pleural Effusions with chronic mild pulmonary edema Lasix per Cardiology   Acute on chronic combined systolic and diastolic heart failure 2D echo results below - Coreg, isordil  HTN No change in tx plan today   DM II  CBGs reasonably well controlled on current insulin regimen - A1C 6.9  Goals of care Palliative was consulted and they have met with the patient and the family. The patient's mental status recovered to baseline and she expressed her wishes to continue to be full code and to continue with the surgery.  DVT Prophylaxis:  lovenox Code Status: FULL    Family Communication: daughter in law at bedside Disposition Plan: SDU  Consultants: Vascular Cardiology  Procedures: 2D Echo The estimated ejection fraction was in the range of 45% to 50%. There is akinesis of the mid-apicalanteroseptal myocardium. Features are consistent with a pseudonormal left ventricular filling pattern, with concomitant abnormal relaxation and increased filling pressure (grade 2 diastolic dysfunction)  Antibiotics: Vancomycin 12/3 >> Cefepime 12/3 >>  Objective: Blood pressure 152/44, pulse 131, temperature 98.2 F (36.8 C), temperature source Oral, resp. rate 16, height 5' 6" (1.676 m), weight 83.689 kg (184 lb 8 oz), SpO2 100 %.  Intake/Output Summary (Last 24 hours) at 02/14/2014 1727 Last data filed at 02/15/2014 1438  Gross per 24 hour  Intake 1358.5 ml  Output    450 ml  Net  908.5 ml   Danley Danker  Weights   02/12/14 0609 02/07/2014 0305 02/15/2014 0501  Weight: 88.4 kg (194 lb 14.2 oz) 90.719 kg (200 lb) 83.689 kg (184 lb 8 oz)   Exam: General: No acute respiratory distress  Lungs: Clear to auscultation bilaterally without wheezes or crackles Cardiovascular: Regular rate and rhythm without murmur gallop or rub  Abdomen: Nontender, nondistended, soft, bowel sounds positive, no rebound, no ascites, no appreciable mass Extremities: No  significant cyanosis, clubbing, or edema bilateral lower extremities - L surgical site dressed/dry  Data Reviewed: Basic Metabolic Panel:  Recent Labs Lab 02/09/14 0017 02/03/2014 0400 02/11/14 0226 02/12/14 0247 03/02/2014 0336  NA 137 141 137 128* 131*  K 3.6* 3.7 3.9 4.2 4.5  CL 94* 98 93* 87* 88*  CO2 30 32 _0 GLUCOSE 199* 176* 172* 218* 156*  BUN 40* 38* 47* 52* 61*  CREATININE 2.10* 1.89* 2.61* 2.95* 3.32*  CALCIUM 8.5 8.7 8.8 8.9 9.0  MG  --   --   --   --  2.2     Liver Function Tests:  Recent Labs Lab 02/07/14 0407 02/11/14 0226 02/12/14 0247 02/27/2014 0336  AST _1 ALT _2 ALKPHOS 82 75 78 80  BILITOT 0.3 <0.2* <0.2* <0.2*  PROT 6.6 6.4 6.7 6.3  ALBUMIN 2.5* 2.3* 2.3* 2.3*   CBC:  Recent Labs Lab 02/11/2014 0400 02/11/14 0226 02/11/14 1450 02/12/14 0247 03/03/2014 0336  WBC 9.0 6.4 7.3 7.0 6.0  NEUTROABS  --   --   --   --  4.3  HGB 8.4* 7.9* 8.1* 8.1* 7.7*  HCT 28.0* 26.4* 26.0* 26.1* 24.6*  MCV 88.9 89.5 90.3 86.7 85.4  PLT 180 164 173 171 167  CBG:  Recent Labs Lab 02/20/2014 0027 02/08/2014 0310 02/11/2014 0829 02/17/2014 1449 02/12/2014 1717  GLUCAP 170* 175* 129* 112* 133*   Recent Results (from the past 240 hour(s))  Surgical pcr screen     Status: None   Collection Time: 02/20/2014 12:17 AM  Result Value Ref Range Status   MRSA, PCR NEGATIVE NEGATIVE Final   Staphylococcus aureus NEGATIVE NEGATIVE Final    Comment:        The Xpert SA Assay (FDA approved for NASAL specimens in patients over 35 years of age), is one component of a comprehensive surveillance program.  Test performance has been validated by [employer] for patients greater than or equal to 18 year old. It is not intended to diagnose infection nor to guide or monitor treatment.    Studies: Dg Chest 2 View  02/14/2014   CLINICAL DATA:  79 year old with some shortness of breath  EXAM: CHEST  2 VIEW  COMPARISON:  02/24/2014 and prior  FINDINGS: The  cardiac silhouette is partially obscured by opacity. The cardiac silhouette appears enlarged. The mediastinal contours are unchanged. Atherosclerotic aortic calcifications are present.  Moderate bilateral pleural effusions are present. Bibasilar consolidation is also noted. Interstitial markings are prominent. There is no pneumothorax.  Degenerative changes of the spine and shoulders are present.  IMPRESSION: 1. Moderate bilateral pleural effusions. 2. Bibasilar consolidation, compressive atelectasis versus pneumonitis. 3. Cardiomegaly and probable mild interstitial pulmonary edema.   Electronically Signed   By: Rosemarie Ax   On: 02/09/2014 09:05     Scheduled Meds: . amitriptyline  25 mg Oral QHS  . antiseptic oral rinse  7 mL Mouth Rinse BID  . carvedilol  6.25 mg Oral BID WC  . ceFEPime (MAXIPIME) IV  1 g Intravenous Q24H  . darbepoetin (ARANESP) injection - NON-DIALYSIS  60 mcg Subcutaneous Q Wed-1800  . docusate sodium  100 mg Oral BID  . feeding supplement (GLUCERNA SHAKE)  237 mL Oral BID BM  . insulin aspart  0-15 Units Subcutaneous 6 times per day  . insulin glargine  5 Units Subcutaneous Daily  . iron polysaccharides  150 mg Oral BID WC  . isosorbide dinitrate  40 mg Oral TID WC  . pantoprazole  40 mg Oral Daily  . sucralfate  1 g Oral TID WC & HS  . vancomycin  1,000 mg Intravenous Q48H   Continuous Infusions: . sodium chloride 20 mL/hr at 02/18/2014 1015  . amiodarone 30 mg/hr (02/11/2014 1716)  . heparin     Time spent: 25 minutes  Cherene Altes, MD [facility] For Consults/Admissions - Flow Manager - [phone] Office  [phone] Pager [phone]  On-Call/Text Page:      [email or web address]      password [email or web address]  5:27 PM  02/22/2014

## 2014-02-13 NOTE — H&P (View-Only) (Signed)
   VASCULAR SURGERY ASSESSMENT & PLAN:  * I have rescheduled her for a left BKA tomorrow. Will holp heparin at 8 AM tomorrow.   * Continue IV ABx.  * Discussed with patient and I will try to call her son Harvie HeckRandy today.   SUBJECTIVE: Much more alert today.  PHYSICAL EXAM: Filed Vitals:   02/11/14 2100 02/12/14 0030 02/12/14 0420 02/12/14 0609  BP: 151/42 138/49 153/49   Pulse: 70 63 66   Temp: 97.2 F (36.2 C) 97.4 F (36.3 C) 97.3 F (36.3 C)   TempSrc: Oral Oral Oral   Resp: 18 14 14    Height:    5\' 6"  (1.676 m)  Weight:    194 lb 14.2 oz (88.4 kg)  SpO2: 100% 96% 99%    Dressing left foot with no drainage.  LABS: Lab Results  Component Value Date   WBC 7.0 02/12/2014   HGB 8.1* 02/12/2014   HCT 26.1* 02/12/2014   MCV 86.7 02/12/2014   PLT 171 02/12/2014   Lab Results  Component Value Date   CREATININE 2.95* 02/12/2014   Lab Results  Component Value Date   INR 1.21 02/14/2014   CBG (last 3)   Recent Labs  02/11/14 1231 02/11/14 1600 02/11/14 2104  GLUCAP 204* 248* 278*    Principal Problem:   Foot ulcer, left Active Problems:   Chronic atrial fibrillation   Essential hypertension   Diabetes mellitus   Warfarin anticoagulation   Chronic diastolic CHF (congestive heart failure)   PAD (peripheral artery disease)   CKD (chronic kidney disease) stage 3, GFR 30-59 ml/min   Foot ulcer   Palliative care encounter   Encephalopathy   CHF (congestive heart failure)   Acute renal failure superimposed on stage 3 chronic kidney disease   PAF (paroxysmal atrial fibrillation)   Pulmonary HTN   Cari CarawayChris Dickson Beeper: [phone] 02/12/2014

## 2014-02-13 NOTE — Interval H&P Note (Signed)
History and Physical Interval Note:  02/09/2014 10:49 AM  [name]  has presented today for surgery, with the diagnosis of Peripheral Vascular Disease with Left foot ulcer I70.244  The various methods of treatment have been discussed with the patient and family. After consideration of risks, benefits and other options for treatment, the patient has consented to  Procedure(s): AMPUTATION ABOVE KNEE-LEFT (Left) as a surgical intervention .  The patient's history has been reviewed, patient examined, no change in status, stable for surgery.  I have reviewed the patient's chart and labs.  Questions were answered to the patient's satisfaction.     Yvett Rossel S

## 2014-02-13 NOTE — Progress Notes (Addendum)
ANTICOAGULATION CONSULT NOTE - Follow Up Consult  Pharmacy Consult for Heparin Indication: atrial fibrillation  Allergies  Allergen Reactions  . Ativan [Lorazepam] Other (See Comments)    Unknown  . Morphine And Related Other (See Comments)    MAR  . Xanax Weber CooksXr [Alprazolam Er]     Oversedation     Patient Measurements: Height: 5\' 6"  (167.6 cm) Weight: 184 lb 8 oz (83.689 kg) IBW/kg (Calculated) : 59.3 Heparin Dosing Weight: 78 kg  Vital Signs: Temp: 98.2 F (36.8 C) (12/11 1530) BP: 142/49 mmHg (12/11 1530) Pulse Rate: 71 (12/11 1530)  Labs:  Recent Labs  02/11/14 0226 02/11/14 1124 02/11/14 1450 02/12/14 0247 08/22/13 0336  HGB 7.9*  --  8.1* 8.1* 7.7*  HCT 26.4*  --  26.0* 26.1* 24.6*  PLT 164  --  173 171 167  HEPARINUNFRC 0.26* 0.70  --  0.39 0.43  CREATININE 2.61*  --   --  2.95* 3.32*    Estimated Creatinine Clearance: 15.5 mL/min (by C-G formula based on Cr of 3.32).   Medications:  Infusions:  . sodium chloride 20 mL/hr at 08/22/13 1015  . amiodarone 30 mg/hr (08/22/13 0341)    Assessment: [age] year old female on chronic anticoagulation for atrial fibrillation. Now s/p AKA today, coumadin has been on hold prior to procedure. Pharmacy is consulted to resume heparin at 8PM. Previously heparin level therapeutic on 1450 units/hr. Hgb 7.7, plt 167 this morning. Most recent INR 1.21 on 12/8  Goal of Therapy:  Heparin level 0.3-0.7 units/ml Monitor platelets by anticoagulation protocol: Yes   Plan:  - Restart heparin 1450 units/hr at 2000 with no bolus - F/u Am heparin level and CBC - PT/INR in Am, please consider resuming coumadin as well.  Bayard HuggerMei Keante Urizar, PharmD, BCPS  Clinical Pharmacist  Pager: [phone]  2013-06-09, 4:17 PM  Addendum: Vancomycin and cefepime has been adjusted base on current renal function. Pt. Scr continue trending up. Will continue monitor renal function closely.   Bayard HuggerMei Suprina Mandeville, PharmD, BCPS  Clinical Pharmacist  Pager:  [phone]

## 2014-02-13 NOTE — Op Note (Signed)
    NAME: [name]   MRN: [id number] DOB: 07/29/1934    DATE OF OPERATION: 02/12/2014  PREOP DIAGNOSIS: nonhealing wound of right leg with peripheral vascular disease  POSTOP DIAGNOSIS: same  PROCEDURE: left above-the-knee amputation  SURGEON: Di Kindlehristopher S. Edilia Boickson, MD, FACS  ASSIST: Emilio AspenEleanor Aguilar RNFA  ANESTHESIA: Gen.   EBL: 100 cc  INDICATIONS: Kelli HamMona P Buechele is a 79 y.o. female with a nonhealing wound of her left leg. She was not a candidate for revascularization. She presents for left above-the-knee amputation.  FINDINGS: Marked edema. The muscle appeared well perfused.   TECHNIQUE: The patient was taken to the operating room and received a general anesthetic. The left lower extremity was prepped and draped in usual sterile fashion. A fishmouth incision was marked above the level of the patella. A tourniquet had been placed on the upper thigh. The leg was exsanguinated with an Esmarch bandage and the tourniquet inflated to 300 mmHg. Under tourniquet control, the incision was carried down to the skin, subcutaneous tissue, fascia, and muscle to the femur which was dissected free circumferentially. Periosteum was elevated and the bone divided proximal to the level of skin division. The leg was removed. The femoral artery and vein were individually suture ligated. The tourniquet was then released. Additional hemostasis was obtained using electrocautery and 2-0 silk ties. The edges of the bone were rasped. The wound was irrigated with copious amounts of saline. The fascial layer was closed with interrupted 2-0 Vicryl's. The skin was closed with staples. Sterile dressing was applied. The patient tolerated the procedure well and was transferred to the recovery room in stable condition. All needle and sponge counts were correct.  Waverly Ferrarihristopher Latonya Nelon, MD, FACS [facility]  DATE OF DICTATION:   02/12/2014

## 2014-02-13 NOTE — Progress Notes (Signed)
Pt not seen today. She is in OR.   If she is in NSR tomorrow, would give amiodarone 400 mg BID and stop IV amiodarone. Lasix d/c'd in setting worsening creatinine.  Tonny BollmanMichael Stephany Poorman 02/21/2014 12:16 PM

## 2014-02-13 NOTE — Transfer of Care (Signed)
Immediate Anesthesia Transfer of Care Note  Patient: [name]  Procedure(s) Performed: Procedure(s): AMPUTATION ABOVE KNEE-LEFT (Left)  Patient Location: PACU  Anesthesia Type:General  Level of Consciousness: awake, alert  and oriented  Airway & Oxygen Therapy: Patient Spontanous Breathing and Patient connected to face mask oxygen  Post-op Assessment: Report given to PACU RN and Post -op Vital signs reviewed and stable  Post vital signs: Reviewed and stable  Complications: No apparent anesthesia complications

## 2014-02-14 LAB — TYPE AND SCREEN
ABO/RH(D): O NEG
Antibody Screen: NEGATIVE
UNIT DIVISION: 0

## 2014-02-14 LAB — COMPREHENSIVE METABOLIC PANEL
ALK PHOS: 97 U/L (ref 39–117)
ALT: 12 U/L (ref 0–35)
AST: 20 U/L (ref 0–37)
Albumin: 2.2 g/dL — ABNORMAL LOW (ref 3.5–5.2)
Anion gap: 18 — ABNORMAL HIGH (ref 5–15)
BILIRUBIN TOTAL: 0.2 mg/dL — AB (ref 0.3–1.2)
BUN: 62 mg/dL — ABNORMAL HIGH (ref 6–23)
CHLORIDE: 88 meq/L — AB (ref 96–112)
CO2: 25 meq/L (ref 19–32)
Calcium: 8.9 mg/dL (ref 8.4–10.5)
Creatinine, Ser: 3.66 mg/dL — ABNORMAL HIGH (ref 0.50–1.10)
GFR calc Af Amer: 13 mL/min — ABNORMAL LOW (ref >90)
GFR calc non Af Amer: 11 mL/min — ABNORMAL LOW (ref >90)
Glucose, Bld: 93 mg/dL (ref 70–99)
Potassium: 4.8 mEq/L (ref 3.7–5.3)
Sodium: 131 mEq/L — ABNORMAL LOW (ref 137–147)
Total Protein: 6.4 g/dL (ref 6.0–8.3)

## 2014-02-14 LAB — HEPARIN LEVEL (UNFRACTIONATED)
Heparin Unfractionated: 0.28 IU/mL — ABNORMAL LOW (ref 0.30–0.70)
Heparin Unfractionated: 0.43 IU/mL (ref 0.30–0.70)

## 2014-02-14 LAB — GLUCOSE, CAPILLARY
GLUCOSE-CAPILLARY: 103 mg/dL — AB (ref 70–99)
GLUCOSE-CAPILLARY: 99 mg/dL (ref 70–99)
GLUCOSE-CAPILLARY: 117 mg/dL — AB (ref 70–99)
GLUCOSE-CAPILLARY: 103 mg/dL — AB (ref 70–99)
Glucose-Capillary: 100 mg/dL — ABNORMAL HIGH (ref 70–99)
Glucose-Capillary: 92 mg/dL (ref 70–99)

## 2014-02-14 LAB — PROTIME-INR
INR: 1.18 (ref 0.00–1.49)
Prothrombin Time: 15.1 seconds (ref 11.6–15.2)

## 2014-02-14 LAB — MAGNESIUM: Magnesium: 2.2 mg/dL (ref 1.5–2.5)

## 2014-02-14 MED ORDER — HALOPERIDOL LACTATE 5 MG/ML IJ SOLN
1.0000 mg | Freq: Four times a day (QID) | INTRAMUSCULAR | Status: DC | PRN
  Administered 2014-02-14: 1 mg via INTRAVENOUS
  Administered 2014-02-14: 2 mg via INTRAVENOUS
  Filled 2014-02-14 (×2): qty 1

## 2014-02-14 MED ORDER — QUETIAPINE 12.5 MG HALF TABLET
12.5000 mg | ORAL_TABLET | Freq: Every day | ORAL | Status: DC
  Administered 2014-02-14: 12.5 mg via ORAL
  Filled 2014-02-14 (×3): qty 1

## 2014-02-14 MED ORDER — HALOPERIDOL LACTATE 5 MG/ML IJ SOLN
2.0000 mg | Freq: Once | INTRAMUSCULAR | Status: AC
  Administered 2014-02-14 (×2): 2 mg via INTRAVENOUS
  Filled 2014-02-14: qty 1

## 2014-02-14 MED ORDER — TRAMADOL HCL 50 MG PO TABS
50.0000 mg | ORAL_TABLET | Freq: Four times a day (QID) | ORAL | Status: DC | PRN
  Administered 2014-02-14: 50 mg via ORAL
  Filled 2014-02-14: qty 1

## 2014-02-14 MED ORDER — HALOPERIDOL LACTATE 5 MG/ML IJ SOLN
2.0000 mg | Freq: Four times a day (QID) | INTRAMUSCULAR | Status: DC | PRN
  Administered 2014-02-15: 2 mg via INTRAVENOUS
  Filled 2014-02-14 (×2): qty 1

## 2014-02-14 MED ORDER — HYDROCODONE-ACETAMINOPHEN 5-325 MG PO TABS
1.0000 | ORAL_TABLET | Freq: Four times a day (QID) | ORAL | Status: DC | PRN
  Administered 2014-02-14: 1 via ORAL
  Filled 2014-02-14: qty 1

## 2014-02-14 NOTE — Progress Notes (Signed)
ANTICOAGULATION CONSULT NOTE - Follow Up Consult  Pharmacy Consult for Heparin  Indication: atrial fibrillation  Allergies  Allergen Reactions  . Ativan [Lorazepam] Other (See Comments)    Unknown  . Morphine And Related Other (See Comments)    MAR  . Xanax Weber CooksXr [Alprazolam Er]     Oversedation     Patient Measurements: Height: 5\' 6"  (167.6 cm) Weight: 189 lb 9.5 oz (86 kg) IBW/kg (Calculated) : 59.3  Vital Signs: Temp: 98.3 F (36.8 C) (12/12 1235) Temp Source: Oral (12/12 1235) BP: 154/56 mmHg (12/12 1235) Pulse Rate: 73 (12/12 1235)  Labs:  Recent Labs  02/11/14 1450  02/12/14 0247 03/03/2014 0336 02/14/14 0312 02/14/14 1200  HGB 8.1*  --  8.1* 7.7*  --   --   HCT 26.0*  --  26.1* 24.6*  --   --   PLT 173  --  171 167  --   --   LABPROT  --   --   --   --  15.1  --   INR  --   --   --   --  1.18  --   HEPARINUNFRC  --   < > 0.39 0.43 0.28* 0.43  CREATININE  --   --  2.95* 3.32* 3.66*  --   < > = values in this interval not displayed.  Estimated Creatinine Clearance: 14.2 mL/min (by C-G formula based on Cr of 3.66).  Assessment: Heparin level now therapeutic  Goal of Therapy:  Heparin level 0.3-0.7 units/ml Monitor platelets by anticoagulation protocol: Yes   Plan:  Continue heparin at 1550 units / hr Follow up AM labs  Thank you. Okey RegalLisa Ahleah Simko, PharmD [phone] 02/14/2014,2:35 PM

## 2014-02-14 NOTE — Progress Notes (Signed)
[facility] TEAM 1 - Stepdown/ICU TEAM  PRESLEE REGAS SNK:[id number] DOB: 1934/06/10 DOA: 03/04/2014 PCP: Manon Hilding, MD  HPI 79 yo female with hx of afib on warfarin, CVA, PVD, CHF, CKD IV, and pulmonary hypertension who presented to the ER from [facility] for an AKA due to a non healing lower extremity wound, with plans for Vascular Surgery to perform an amputation 12/3. Shortly after admission the patient became very lethargic therefore surgery was canceled. An ABG showed hypercarbic respiratory failure and the patient was placed on BiPAP and moved to step down unit. She was started on broad-spectrum antibiotics for the lower extremity wound. Her mental status improved significantly and she returned to baseline on 12/5. Cardiology was consulted for preop clearance and they have been following patient while hospitalized.   Subjective:   Pt has been quite agitated and combative today.  She has been delirious and will not respond to verbal redirection.    Assessment/Plan:  Acute intermittent encephalopathy P37 and folic acid levels normal - mental status again quite altered post-op - likely due to anesthesia, pain meds, and sundowning - utilizing prn Haldol as pt refractory to other measures and is interfering with her own medical care and safety   PVD with non healing wound L foot   Now s/p AKA per Vascular Surgery - post-op care per surgery  Parox Afib with RVR  On heparin gtt for now, but likely poor long term anticoag candidate - appreciate Cardiology input - now on amio gtt - rate well controlled and now in NSR   Acute renal failure on Chronic Kidney disease stage IV Baseline creatinine ~2.2 - crt has increased, likely due to decreased perfusion in setting of afib w/ RVR - follow trend   Normocytic anemia Likely anemia of chronic kidney disease exacerbated by the smoldering L foot wound - transfuse as needed to keep Hgb 8.0 or > (no known hx of CAD but TTE suggestive of possible CAD in  pt w/ known PVD) - got 1u PRBC 12/11  Bilateral Pleural Effusions with chronic mild pulmonary edema Lasix per Cardiology   Acute on chronic combined systolic and diastolic heart failure 2D echo results below - Coreg, isordil  HTN No change in tx plan today   DM II  CBGs reasonably well controlled on current insulin regimen - A1C 6.9  Goals of care Palliative was consulted and they have met with the patient and the family. The patient's mental status recovered to baseline and she expressed her wishes to continue to be full code and to continue with the surgery.  DVT Prophylaxis:  lovenox Code Status: FULL    Family Communication: daughter in law at bedside Disposition Plan: SDU  Consultants: Vascular Cardiology  Procedures: 2D Echo The estimated ejection fraction was in the range of 45% to 50%. There is akinesis of the mid-apicalanteroseptal myocardium. Features are consistent with a pseudonormal left ventricular filling pattern, with concomitant abnormal relaxation and increased filling pressure (grade 2 diastolic dysfunction)  Antibiotics: Vancomycin 12/3 > 12/11 Cefepime 12/3 > 12/11  Objective: Blood pressure 162/54, pulse 72, temperature 97.7 F (36.5 C), temperature source Axillary, resp. rate 15, height $RemoveBe'5\' 6"'WgYVbSLHl$  (1.676 m), weight 86 kg (189 lb 9.5 oz), SpO2 96 %.  Intake/Output Summary (Last 24 hours) at 02/14/14 1616 Last data filed at 02/14/14 0700  Gross per 24 hour  Intake 448.42 ml  Output      0 ml  Net 448.42 ml   Filed Weights   02/14/2014  1607 02/22/2014 0501 02/14/14 0401  Weight: 90.719 kg (200 lb) 83.689 kg (184 lb 8 oz) 86 kg (189 lb 9.5 oz)   Exam: General: No acute respiratory distress - lethargic at present  Lungs: Clear to auscultation bilaterally without wheezes or crackles Cardiovascular: Regular rate and rhythm without murmur gallop or rub  Abdomen: Nontender, nondistended, soft, bowel sounds positive, no rebound, no ascites, no appreciable  mass Extremities: No significant cyanosis, clubbing, edema bilateral lower extremities - L surgical site dressed/dry  Data Reviewed: Basic Metabolic Panel:  Recent Labs Lab 02/06/2014 0400 02/11/14 0226 02/12/14 0247 02/04/2014 0336 02/14/14 0312  NA 141 137 128* 131* 131*  K 3.7 3.9 4.2 4.5 4.8  CL 98 93* 87* 88* 88*  CO2 32 $Remo'31 26 27 25  'LPiSm$ GLUCOSE 176* 172* 218* 156* 93  BUN 38* 47* 52* 61* 62*  CREATININE 1.89* 2.61* 2.95* 3.32* 3.66*  CALCIUM 8.7 8.8 8.9 9.0 8.9  MG  --   --   --  2.2 2.2     Liver Function Tests:  Recent Labs Lab 02/11/14 0226 02/12/14 0247 02/15/2014 0336 02/14/14 0312  AST $Re'16 17 15 20  'XsE$ ALT $R'11 12 11 12  'Cn$ ALKPHOS 75 78 80 97  BILITOT <0.2* <0.2* <0.2* 0.2*  PROT 6.4 6.7 6.3 6.4  ALBUMIN 2.3* 2.3* 2.3* 2.2*   CBC:  Recent Labs Lab 02/21/2014 0400 02/11/14 0226 02/11/14 1450 02/12/14 0247 02/19/2014 0336  WBC 9.0 6.4 7.3 7.0 6.0  NEUTROABS  --   --   --   --  4.3  HGB 8.4* 7.9* 8.1* 8.1* 7.7*  HCT 28.0* 26.4* 26.0* 26.1* 24.6*  MCV 88.9 89.5 90.3 86.7 85.4  PLT 180 164 173 171 167  CBG:  Recent Labs Lab 02/18/2014 2013 02/14/14 0021 02/14/14 0402 02/14/14 0756 02/14/14 1234  GLUCAP 121* 92 103* 99 117*   Recent Results (from the past 240 hour(s))  Surgical pcr screen     Status: None   Collection Time: 02/22/2014 12:17 AM  Result Value Ref Range Status   MRSA, PCR NEGATIVE NEGATIVE Final   Staphylococcus aureus NEGATIVE NEGATIVE Final    Comment:        The Xpert SA Assay (FDA approved for NASAL specimens in patients over 73 years of age), is one component of a comprehensive surveillance program.  Test performance has been validated by [employer] for patients greater than or equal to 53 year old. It is not intended to diagnose infection nor to guide or monitor treatment.    Studies: Dg Chest 2 View  02/04/2014   CLINICAL DATA:  79 year old with some shortness of breath  EXAM: CHEST  2 VIEW  COMPARISON:  02/05/2014 and prior   FINDINGS: The cardiac silhouette is partially obscured by opacity. The cardiac silhouette appears enlarged. The mediastinal contours are unchanged. Atherosclerotic aortic calcifications are present.  Moderate bilateral pleural effusions are present. Bibasilar consolidation is also noted. Interstitial markings are prominent. There is no pneumothorax.  Degenerative changes of the spine and shoulders are present.  IMPRESSION: 1. Moderate bilateral pleural effusions. 2. Bibasilar consolidation, compressive atelectasis versus pneumonitis. 3. Cardiomegaly and probable mild interstitial pulmonary edema.   Electronically Signed   By: Rosemarie Ax   On: 02/11/2014 09:05     Scheduled Meds: . amitriptyline  25 mg Oral QHS  . antiseptic oral rinse  7 mL Mouth Rinse BID  . carvedilol  6.25 mg Oral BID WC  . ceFEPime (MAXIPIME) IV  1 g Intravenous  Q24H  . darbepoetin (ARANESP) injection - NON-DIALYSIS  60 mcg Subcutaneous Q Wed-1800  . docusate sodium  100 mg Oral BID  . feeding supplement (GLUCERNA SHAKE)  237 mL Oral BID BM  . insulin aspart  0-15 Units Subcutaneous 6 times per day  . insulin glargine  5 Units Subcutaneous Daily  . iron polysaccharides  150 mg Oral BID WC  . isosorbide dinitrate  40 mg Oral TID WC  . pantoprazole  40 mg Oral Daily  . sucralfate  1 g Oral TID WC & HS   Continuous Infusions: . sodium chloride 20 mL/hr at 02/03/2014 1015  . amiodarone 30 mg/hr (02/14/14 0402)  . heparin 1,550 Units/hr (02/14/14 1430)   Time spent: 35 minutes  Cherene Altes, MD [facility] For Consults/Admissions - Flow Manager - [phone] Office  [phone] Pager [phone]  On-Call/Text Page:      [email or web address]      password [email or web address]  4:16 PM  02/14/2014

## 2014-02-14 NOTE — Progress Notes (Signed)
ANTICOAGULATION CONSULT NOTE - Follow Up Consult  Pharmacy Consult for Heparin  Indication: atrial fibrillation  Allergies  Allergen Reactions  . Ativan [Lorazepam] Other (See Comments)    Unknown  . Morphine And Related Other (See Comments)    MAR  . Xanax Weber CooksXr [Alprazolam Er]     Oversedation     Patient Measurements: Height: 5\' 6"  (167.6 cm) Weight: 184 lb 8 oz (83.689 kg) IBW/kg (Calculated) : 59.3  Vital Signs: Temp: 97.4 F (36.3 C) (12/12 0016) Temp Source: Axillary (12/12 0016) BP: 145/54 mmHg (12/12 0016) Pulse Rate: 67 (12/12 0016)  Labs:  Recent Labs  02/11/14 1450 02/12/14 0247 26-May-2013 0336 02/14/14 0312  HGB 8.1* 8.1* 7.7*  --   HCT 26.0* 26.1* 24.6*  --   PLT 173 171 167  --   LABPROT  --   --   --  15.1  INR  --   --   --  1.18  HEPARINUNFRC  --  0.39 0.43 0.28*  CREATININE  --  2.95* 3.32*  --     Estimated Creatinine Clearance: 15.5 mL/min (by C-G formula based on Cr of 3.32).  Assessment: Sub-therapeutic heparin level after re-start s/p AKA. No issues per RN.   Goal of Therapy:  Heparin level 0.3-0.7 units/ml Monitor platelets by anticoagulation protocol: Yes   Plan:  -Increase heparin drip 1550 units/hr -1200 HL -Daily CBC/HL -Monitor for bleeding, trend Hgb  [name], [name] 02/14/2014,4:00 AM

## 2014-02-14 NOTE — Anesthesia Postprocedure Evaluation (Signed)
  Anesthesia Post-op Note  Patient: [name]  Procedure(s) Performed: Procedure(s): AMPUTATION ABOVE KNEE-LEFT (Left)  Patient Location: PACU  Anesthesia Type:General  Level of Consciousness: awake  Airway and Oxygen Therapy: Patient Spontanous Breathing  Post-op Pain: moderate  Post-op Assessment: Post-op Vital signs reviewed, Patient's Cardiovascular Status Stable, Respiratory Function Stable, Patent Airway, No signs of Nausea or vomiting and Pain level controlled  Post-op Vital Signs: Reviewed and stable  Last Vitals:  Filed Vitals:   02/14/14 0401  BP: 132/55  Pulse: 73  Temp: 36.2 C  Resp: 10    Complications: No apparent anesthesia complications

## 2014-02-14 NOTE — Progress Notes (Signed)
ANTIBIOTIC CONSULT NOTE - FOLLOW UP  Pharmacy Consult for vancomycin and cefepime Indication: UTI, HCAP , non healing wound (AKA)   Allergies  Allergen Reactions  . Ativan [Lorazepam] Other (See Comments)    Unknown  . Morphine And Related Other (See Comments)    MAR  . Xanax Weber CooksXr [Alprazolam Er]     Oversedation    Assessment: 89 YOF with UTI, HCAP, non-healing leg wound on vancomycin and cefepime. Renal function is worse today  WBC = 6.0, Scr = 3.66 (trending up), afebrile 12/3 Vancomycin> 12/3 Cefepime>  Stop antibiotics?  Have discontinued vancomycin for now with increase in Scr -- do we need to continue to dose?  Now s/p AKA - Resume Coumadin?   Goal of Therapy:  Vancomycin trough level 15-20 mcg/ml  Plan:  Stop Vancomycin -- level in AM to determine further doses if needed Continue Cefepime 1 gram iv Q 24 hours  Pharmacy continues to dose heparin          Labs:  Recent Labs  02/11/14 1450 02/12/14 0247 02/14/2014 0336 02/14/14 0312  WBC 7.3 7.0 6.0  --   HGB 8.1* 8.1* 7.7*  --   PLT 173 171 167  --   CREATININE  --  2.95* 3.32* 3.66*   Estimated Creatinine Clearance: 14.2 mL/min (by C-G formula based on Cr of 3.66). No results for input(s): VANCOTROUGH, VANCOPEAK, VANCORANDOM, GENTTROUGH, GENTPEAK, GENTRANDOM, TOBRATROUGH, TOBRAPEAK, TOBRARND, AMIKACINPEAK, AMIKACINTROU, AMIKACIN in the last 72 hours.    Thank you. Okey RegalLisa Tarra Pence, PharmD [phone] 02/14/2014 11:16 AM

## 2014-02-14 NOTE — Progress Notes (Signed)
PT Cancellation Note  Patient Details Name: [name] MRN: [id number] DOB: 02/09/1935   Cancelled Treatment:    Reason Eval/Treat Not Completed: Medical issues which prohibited therapy.  Attempted PT evaluation today.  Patient confused and agitated.  Attempting to climb over bed rail.  Family in room.  Sister states it takes "several days for the anesthesia to get out of her system".  Will return tomorrow to attempt PT eval if appropriate for patient.   [name], [name] H 02/14/2014, 1:28 PM Durenda HurtSusan H. Renaldo Fiddleravis, PT, [facility] Acute [facility] Pager [phone]

## 2014-02-14 NOTE — Progress Notes (Signed)
SUBJECTIVE: The patient is recovering postoperatively.  She remains confused.  At this time, she denies chest pain, shortness of breath, or any new concerns.  [name]. amitriptyline  25 mg Oral QHS  . antiseptic oral rinse  7 mL Mouth Rinse BID  . carvedilol  6.25 mg Oral BID WC  . ceFEPime (MAXIPIME) IV  1 g Intravenous Q24H  . darbepoetin (ARANESP) injection - NON-DIALYSIS  60 mcg Subcutaneous Q Wed-1800  . docusate sodium  100 mg Oral BID  . feeding supplement (GLUCERNA SHAKE)  237 mL Oral BID BM  . haloperidol lactate  2 mg Intravenous Once  . insulin aspart  0-15 Units Subcutaneous 6 times per day  . insulin glargine  5 Units Subcutaneous Daily  . iron polysaccharides  150 mg Oral BID WC  . isosorbide dinitrate  40 mg Oral TID WC  . pantoprazole  40 mg Oral Daily  . sucralfate  1 g Oral TID WC & HS   . sodium chloride 20 mL/hr at 02/15/2014 1015  . amiodarone 30 mg/hr (02/14/14 0402)  . heparin 1,550 Units/hr (02/14/14 0405)    OBJECTIVE: Physical Exam: Filed Vitals:   02/14/14 1000 02/14/14 1100 02/14/14 1202 02/14/14 1235  BP: 145/37 150/49 171/53 154/56  Pulse: 74 71 76 73  Temp:    98.3 F (36.8 C)  TempSrc:    Oral  Resp: 15 12 17 14   Height:      Weight:      SpO2: 98% 98% 97% 97%    Intake/Output Summary (Last 24 hours) at 02/14/14 1242 Last data filed at 02/14/14 0700  Gross per 24 hour  Intake 1000.12 ml  Output      0 ml  Net 1000.12 ml    Telemetry reveals sinus rhythm  GEN- The patient is elderly appearing, alert but very confused and aggitated Head- normocephalic, atraumatic Eyes-  Sclera clear, conjunctiva pink Ears- hearing intact Oropharynx- clear Neck- supple  Lungs- decreased BS throughout, normal work of breathing Heart- Regular rate and rhythm  GI- soft, NT, ND, + BS Extremities- no clubbing, cyanosis, s/p AKA   LABS: Basic Metabolic Panel:  Recent Labs  47/82/9512/19/2015 0336 02/14/14 0312  NA 131* 131*  K 4.5 4.8  CL 88* 88*  CO2 27  25  GLUCOSE 156* 93  BUN 61* 62*  CREATININE 3.32* 3.66*  CALCIUM 9.0 8.9  MG 2.2 2.2   Liver Function Tests:  Recent Labs  02/09/2014 0336 02/14/14 0312  AST 15 20  ALT 11 12  ALKPHOS 80 97  BILITOT <0.2* 0.2*  PROT 6.3 6.4  ALBUMIN 2.3* 2.2*   No results for input(s): LIPASE, AMYLASE in the last 72 hours. CBC:  Recent Labs  02/12/14 0247 02/13/14 0336  WBC 7.0 6.0  NEUTROABS  --  4.3  HGB 8.1* 7.7*  HCT 26.1* 24.6*  MCV 86.7 85.4  PLT 171 167   Anemia Panel:  Recent Labs  02/11/14 1450 02/12/14 1400  VITAMINB12  --  611  FOLATE  --  11.2  FERRITIN 315*  --   TIBC  --  210*  IRON  --  27*  RETICCTPCT  --  2.2    ASSESSMENT AND PLAN:  Principal Problem:   Foot ulcer, left Active Problems:   Essential hypertension   Diabetes mellitus   Warfarin anticoagulation   Chronic diastolic CHF (congestive heart failure)   PAD (peripheral artery disease)   CKD (chronic kidney disease) stage 3, GFR 30-59 ml/min  Acute renal failure superimposed on stage 3 chronic kidney disease   PAF (paroxysmal atrial fibrillation)   Pulmonary HTN  1. Atrial fibrillation Presently remains in sinus rhythm with amiodarone. Would convert to oral if taking POs.  Amio 200mg  daily On heparin drip Given renal failure, she is not a candidate for NOAC therapy Given advanced age, confusion, and fall risks, she may not be a very good coumadin candidate either.  Continue on IV heparin for now and then consider whether or not she can take coumadin prior to discharge.  2. Hypertension Stable Elevated at times with agitation  3. Acute renal failure Would hold diuretics  Her overall prognosis is very poor Ultimately, palliative measures may be appropriate  Cardiology to see as needed over the weekend. Please call with questions.    Hillis RangeAllred, Dmari Schubring, MD 02/14/2014 12:42 PM

## 2014-02-14 NOTE — Progress Notes (Signed)
   VASCULAR SURGERY ASSESSMENT & PLAN:  * 1 Day Post-Op s/p: Left AKA  *  Confused last PM. Will try Ultram for pain and minimize oxycodone and dilaudid.   * Begin Dressing changes tomorrow.  SUBJECTIVE: No specific complaint, except wants to get out of bed.  PHYSICAL EXAM: Filed Vitals:   2013/11/13 1800 2013/11/13 2010 02/14/14 0016 02/14/14 0401  BP: 131/37 149/44 145/54 132/55  Pulse: 64 70 67 73  Temp:  97.3 F (36.3 C) 97.4 F (36.3 C) 97.1 F (36.2 C)  TempSrc:  Axillary Axillary Axillary  Resp: 16 19 18 10   Height:      Weight:    189 lb 9.5 oz (86 kg)  SpO2: 100% 97% 100% 100%   Dressing dry  LABS: Lab Results  Component Value Date   WBC 6.0 06/26/13   HGB 7.7* 06/26/13   HCT 24.6* 06/26/13   MCV 85.4 06/26/13   PLT 167 06/26/13   Lab Results  Component Value Date   CREATININE 3.66* 02/14/2014   Lab Results  Component Value Date   INR 1.18 02/14/2014   CBG (last 3)   Recent Labs  2013/11/13 2013 02/14/14 0021 02/14/14 0402  GLUCAP 121* 92 103*    Principal Problem:   Foot ulcer, left Active Problems:   Essential hypertension   Diabetes mellitus   Warfarin anticoagulation   Chronic diastolic CHF (congestive heart failure)   PAD (peripheral artery disease)   CKD (chronic kidney disease) stage 3, GFR 30-59 ml/min   Acute renal failure superimposed on stage 3 chronic kidney disease   PAF (paroxysmal atrial fibrillation)   Pulmonary HTN   Cari CarawayChris Dickson Beeper: [phone] 02/14/2014

## 2014-02-15 ENCOUNTER — Other Ambulatory Visit: Payer: Medicare Other

## 2014-02-15 LAB — BASIC METABOLIC PANEL
Anion gap: 22 — ABNORMAL HIGH (ref 5–15)
BUN: 65 mg/dL — ABNORMAL HIGH (ref 6–23)
CO2: 21 mEq/L (ref 19–32)
Calcium: 9.1 mg/dL (ref 8.4–10.5)
Chloride: 85 mEq/L — ABNORMAL LOW (ref 96–112)
Creatinine, Ser: 4.02 mg/dL — ABNORMAL HIGH (ref 0.50–1.10)
GFR calc Af Amer: 11 mL/min — ABNORMAL LOW (ref >90)
GFR, EST NON AFRICAN AMERICAN: 10 mL/min — AB (ref >90)
Glucose, Bld: 113 mg/dL — ABNORMAL HIGH (ref 70–99)
POTASSIUM: 4.9 meq/L (ref 3.7–5.3)
SODIUM: 128 meq/L — AB (ref 137–147)

## 2014-02-15 LAB — CBC
HCT: 25.2 % — ABNORMAL LOW (ref 36.0–46.0)
Hemoglobin: 8.1 g/dL — ABNORMAL LOW (ref 12.0–15.0)
MCH: 27 pg (ref 26.0–34.0)
MCHC: 32.1 g/dL (ref 30.0–36.0)
MCV: 84 fL (ref 78.0–100.0)
PLATELETS: 173 10*3/uL (ref 150–400)
RBC: 3 MIL/uL — ABNORMAL LOW (ref 3.87–5.11)
RDW: 15.8 % — AB (ref 11.5–15.5)
WBC: 7.2 10*3/uL (ref 4.0–10.5)

## 2014-02-15 LAB — GLUCOSE, CAPILLARY
GLUCOSE-CAPILLARY: 103 mg/dL — AB (ref 70–99)
GLUCOSE-CAPILLARY: 124 mg/dL — AB (ref 70–99)
GLUCOSE-CAPILLARY: 162 mg/dL — AB (ref 70–99)
Glucose-Capillary: 141 mg/dL — ABNORMAL HIGH (ref 70–99)
Glucose-Capillary: 122 mg/dL — ABNORMAL HIGH (ref 70–99)
Glucose-Capillary: 139 mg/dL — ABNORMAL HIGH (ref 70–99)

## 2014-02-15 LAB — HEPARIN LEVEL (UNFRACTIONATED): HEPARIN UNFRACTIONATED: 0.42 [IU]/mL (ref 0.30–0.70)

## 2014-02-15 MED ORDER — HYDRALAZINE HCL 20 MG/ML IJ SOLN: 5.0000 mg | INTRAMUSCULAR | Status: DC | PRN

## 2014-02-15 MED ORDER — METOPROLOL TARTRATE 1 MG/ML IV SOLN
5.0000 mg | Freq: Three times a day (TID) | INTRAVENOUS | Status: DC
  Administered 2014-02-15 (×2): 5 mg via INTRAVENOUS
  Filled 2014-02-15 (×5): qty 5

## 2014-02-15 MED ORDER — SODIUM CHLORIDE 0.9 % IV BOLUS (SEPSIS)
250.0000 mL | Freq: Once | INTRAVENOUS | Status: AC
  Administered 2014-02-15: 250 mL via INTRAVENOUS

## 2014-02-15 MED ORDER — INSULIN ASPART 100 UNIT/ML ~~LOC~~ SOLN
0.0000 [IU] | Freq: Three times a day (TID) | SUBCUTANEOUS | Status: DC
  Administered 2014-02-15: 3 [IU] via SUBCUTANEOUS

## 2014-02-15 MED ORDER — TRAMADOL HCL 50 MG PO TABS: 50.0000 mg | ORAL_TABLET | Freq: Two times a day (BID) | ORAL | Status: DC | PRN

## 2014-02-15 MED ORDER — HALOPERIDOL LACTATE 5 MG/ML IJ SOLN: 2.0000 mg | Freq: Four times a day (QID) | INTRAMUSCULAR | Status: DC | PRN

## 2014-02-15 MED ORDER — HALOPERIDOL LACTATE 5 MG/ML IJ SOLN
1.0000 mg | Freq: Four times a day (QID) | INTRAMUSCULAR | Status: DC | PRN
  Administered 2014-02-15: 1 mg via INTRAVENOUS
  Filled 2014-02-15 (×2): qty 1

## 2014-02-15 MED ORDER — HALOPERIDOL LACTATE 5 MG/ML IJ SOLN
5.0000 mg | Freq: Once | INTRAMUSCULAR | Status: AC
  Administered 2014-02-15: 5 mg via INTRAVENOUS

## 2014-02-15 MED ORDER — HALOPERIDOL LACTATE 5 MG/ML IJ SOLN
1.0000 mg | Freq: Three times a day (TID) | INTRAMUSCULAR | Status: DC
  Administered 2014-02-15 (×2): 1 mg via INTRAVENOUS
  Filled 2014-02-15: qty 1
  Filled 2014-02-15: qty 0.2
  Filled 2014-02-15: qty 1
  Filled 2014-02-15 (×2): qty 0.2

## 2014-02-15 NOTE — Progress Notes (Signed)
eLink Physician-Brief Progress Note Patient Name: Kelli HamMona P Paar DOB: 05/16/1934 MRN: [id number]   Date of Service  02/15/2014  HPI/Events of Note  Agitated delirium in elderly patient, has not responded to low dose haldol this pm. Last ECG 12/8, QT-c 0.46 12/12 am on rhythm strip.   eICU Interventions  Will escalate haldol dose and follow over camera.      Intervention Category Major Interventions: Delirium, psychosis, severe agitation - evaluation and management  Nekesha Font S. 02/15/2014, 1:38 AM

## 2014-02-15 NOTE — Progress Notes (Signed)
[place] TEAM 1 - Stepdown/ICU TEAM  [name] UVO:[id number] DOB: 03-28-1934 DOA: 02/10/2014 PCP: Manon Hilding, MD  HPI 79 yo [name] with hx of afib on warfarin, CVA, PVD, CHF, CKD IV, and pulmonary hypertension who presented to the ER from [facility] for an AKA due to a non healing lower extremity wound, with plans for Vascular Surgery to perform an amputation 12/3. Shortly after admission the patient became very lethargic therefore surgery was canceled. An ABG showed hypercarbic respiratory failure and the patient was placed on BiPAP and moved to step down unit. She was started on broad-spectrum antibiotics for the lower extremity wound. Her mental status improved significantly and she returned to baseline on 12/5. Cardiology was consulted for preop clearance and they have been following patient while hospitalized.   Subjective:   Remains very agitated and delirious.  Has thus far proven refractory to all attempts to reorient or even sedate her.    Assessment/Plan:  Acute intermittent encephalopathy V42 and folic acid levels normal - mental status remains severely altered post-op - likely due to anesthesia, pain meds, and sundowning - increase haldol and give on schedule    PVD with non healing wound L foot   Now s/p AKA per Vascular Surgery - post-op care per surgery  Parox Afib with RVR  On heparin gtt for now, but likely poor long term anticoag candidate - appreciate Cardiology input - now on amio gtt - rate well controlled and in NSR   Acute renal failure on Chronic Kidney disease stage IV Baseline creatinine ~2.2 - crt has increased, likely due to decreased perfusion in setting of afib w/ RVR - follow trend - hydrate gently   Normocytic anemia Likely anemia of chronic kidney disease exacerbated by the smoldering L foot wound - transfuse as needed to keep Hgb 8.0 or > (no known hx of CAD but TTE suggestive of possible CAD in pt w/ known PVD) - got 1u PRBC 12/11  Bilateral  Pleural Effusions with chronic mild pulmonary edema clinically stable   Acute on chronic combined systolic and diastolic heart failure 2D echo results below - well compensated at present   HTN Transition to IV meds as able due to pt refusing to take meds in delirious state   DM II  CBGs reasonably well controlled on current insulin regimen - A1C 6.9  Goals of care Palliative was consulted and they have met with the patient and the family. The patient's mental status recovered to baseline and she expressed her wishes to continue to be full code and to continue with the surgery.  DVT Prophylaxis:  IV heparin  Code Status: FULL    Family Communication: spoke w/ son at bedside at length  Disposition Plan: SDU  Consultants: Vascular Cardiology  Procedures: 2D Echo The estimated ejection fraction was in the range of 45% to 50%. There is akinesis of the mid-apicalanteroseptal myocardium. Features are consistent with a pseudonormal left ventricular filling pattern, with concomitant abnormal relaxation and increased filling pressure (grade 2 diastolic dysfunction)  Antibiotics: Vancomycin 12/3 > 12/11 Cefepime 12/3 > 12/11  Objective: Blood pressure 137/45, pulse 74, temperature 96.2 F (35.7 C), temperature source Axillary, resp. rate 18, height $RemoveBe'5\' 6"'QZNAfcTAy$  (1.676 m), weight 87.3 kg (192 lb 7.4 oz), SpO2 94 %.  Intake/Output Summary (Last 24 hours) at 02/15/14 1400 Last data filed at 02/15/14 0400  Gross per 24 hour  Intake  289.8 ml  Output      0 ml  Net  289.8 ml   Filed Weights   02/11/2014 0501 02/14/14 0401 02/15/14 0412  Weight: 83.689 kg (184 lb 8 oz) 86 kg (189 lb 9.5 oz) 87.3 kg (192 lb 7.4 oz)   Exam: General: No acute respiratory distress - agitated and yelling out - does not respond to commands or answer questions  Lungs: Clear to auscultation bilaterally without wheezes or crackles Cardiovascular: Regular rate and rhythm without murmur gallop or rub  Abdomen:  Nontender, nondistended, soft, bowel sounds positive, no rebound, no ascites, no appreciable mass Extremities: No significant cyanosis, clubbing, edema bilateral lower extremities - L surgical site dressed/dry  Data Reviewed: Basic Metabolic Panel:  Recent Labs Lab 02/11/14 0226 02/12/14 0247 02/15/2014 0336 02/14/14 0312 02/15/14 0315  NA 137 128* 131* 131* 128*  K 3.9 4.2 4.5 4.8 4.9  CL 93* 87* 88* 88* Kelli*  CO2 $Re'31 26 27 25 21  'Tcr$ GLUCOSE 172* 218* 156* 93 113*  BUN 47* 52* 61* 62* 65*  CREATININE 2.61* 2.95* 3.32* 3.66* 4.02*  CALCIUM 8.8 8.9 9.0 8.9 9.1  MG  --   --  2.2 2.2  --      Liver Function Tests:  Recent Labs Lab 02/11/14 0226 02/12/14 0247 02/26/2014 0336 02/14/14 0312  AST $Re'16 17 15 20  'eLW$ ALT $R'11 12 11 12  'Lo$ ALKPHOS 75 78 80 97  BILITOT <0.2* <0.2* <0.2* 0.2*  PROT 6.4 6.7 6.3 6.4  ALBUMIN 2.3* 2.3* 2.3* 2.2*   CBC:  Recent Labs Lab 02/11/14 0226 02/11/14 1450 02/12/14 0247 02/12/2014 0336 02/15/14 0315  WBC 6.4 7.3 7.0 6.0 7.2  NEUTROABS  --   --   --  4.3  --   HGB 7.9* 8.1* 8.1* 7.7* 8.1*  HCT 26.4* 26.0* 26.1* 24.6* 25.2*  MCV 89.5 90.3 86.7 Kelli.4 84.0  PLT 164 173 171 167 173  CBG:  Recent Labs Lab 02/14/14 2006 02/15/14 0015 02/15/14 0415 02/15/14 0832 02/15/14 1330  GLUCAP 103* 103* 141* 122* 139*   Studies: No results found.   Scheduled Meds: . antiseptic oral rinse  7 mL Mouth Rinse BID  . carvedilol  6.25 mg Oral BID WC  . darbepoetin (ARANESP) injection - NON-DIALYSIS  60 mcg Subcutaneous Q Wed-1800  . docusate sodium  100 mg Oral BID  . feeding supplement (GLUCERNA SHAKE)  237 mL Oral BID BM  . insulin aspart  0-15 Units Subcutaneous 6 times per day  . insulin glargine  5 Units Subcutaneous Daily  . iron polysaccharides  150 mg Oral BID WC  . isosorbide dinitrate  40 mg Oral TID WC  . pantoprazole  40 mg Oral Daily  . QUEtiapine  12.5 mg Oral QHS  . sucralfate  1 g Oral TID WC & HS   Continuous Infusions: . sodium chloride  20 mL/hr (02/15/14 0848)  . amiodarone 30 mg/hr (02/15/14 0447)  . heparin 1,550 Units/hr (02/15/14 0447)   Time spent: 35 minutes  Cherene Altes, MD [facility] For Consults/Admissions - Flow Manager - [phone] Office  [phone] Pager [phone]  On-Call/Text Page:      [email or web address]      password [email or web address]  2:00 PM  02/15/2014

## 2014-02-15 NOTE — Progress Notes (Signed)
ANTICOAGULATION CONSULT NOTE - Follow Up Consult  Pharmacy Consult for Heparin  Indication: atrial fibrillation  Allergies  Allergen Reactions  . Ativan [Lorazepam] Other (See Comments)    Unknown  . Morphine And Related Other (See Comments)    MAR  . Xanax Weber CooksXr [Alprazolam Er]     Oversedation     Patient Measurements: Height: 5\' 6"  (167.6 cm) Weight: 192 lb 7.4 oz (87.3 kg) IBW/kg (Calculated) : 59.3  Vital Signs: Temp: 96.6 F (35.9 C) (12/13 0858) Temp Source: Axillary (12/13 0858) BP: 169/56 mmHg (12/13 0800) Pulse Rate: 74 (12/13 0800)  Labs:  Recent Labs  02/14/2014 0336 02/14/14 0312 02/14/14 1200 02/15/14 0315  HGB 7.7*  --   --  8.1*  HCT 24.6*  --   --  25.2*  PLT 167  --   --  173  LABPROT  --  15.1  --   --   INR  --  1.18  --   --   HEPARINUNFRC 0.43 0.28* 0.43 0.42  CREATININE 3.32* 3.66*  --  4.02*    Estimated Creatinine Clearance: 13 mL/min (by C-G formula based on Cr of 4.02).  Assessment: 79 year old female continues on heparin bridge (previously on Coumadin) for Afib following AKA 12/11.    Heparin level therapeutic this AM, CBC is stable.  Goal of Therapy:  Heparin level 0.3-0.7 units/ml Monitor platelets by anticoagulation protocol: Yes   Plan:  Continue heparin at 1550 units / hr Follow up AM labs  Thank you. Okey RegalLisa Dolphus Linch, PharmD [phone] 02/15/2014,11:22 AM

## 2014-02-15 NOTE — Progress Notes (Signed)
   VASCULAR SURGERY ASSESSMENT & PLAN:  * 2 Days Post-Op s/p: left above-the-knee amputation. Her dressing was changed this morning and the wound looks fine. She has moderate serous drainage from her edema.  * Begin daily dressing changes to left AKA  *  Confusion: The patient is receiving Haldol.  SUBJECTIVE: confused this morning.  PHYSICAL EXAM: Filed Vitals:   02/14/14 1600 02/14/14 2008 02/15/14 0000 02/15/14 0412  BP: 162/54 143/63 153/34 164/54  Pulse: 72 78  75  Temp: 97.7 F (36.5 C) 97.5 F (36.4 C)  97.9 F (36.6 C)  TempSrc: Axillary Axillary  Axillary  Resp: 15 20  18   Height:      Weight:    192 lb 7.4 oz (87.3 kg)  SpO2: 96% 92%     Her left above-the-knee amputation dressing was changed. The wound looks good. There is moderate serous drainage from her edema.  LABS: Lab Results  Component Value Date   WBC 7.2 02/15/2014   HGB 8.1* 02/15/2014   HCT 25.2* 02/15/2014   MCV 84.0 02/15/2014   PLT 173 02/15/2014   Lab Results  Component Value Date   CREATININE 4.02* 02/15/2014   Lab Results  Component Value Date   INR 1.18 02/14/2014   CBG (last 3)   Recent Labs  02/14/14 2006 02/15/14 0015 02/15/14 0415  GLUCAP 103* 103* 141*    Principal Problem:   Foot ulcer, left Active Problems:   Essential hypertension   Diabetes mellitus   Warfarin anticoagulation   Chronic diastolic CHF (congestive heart failure)   PAD (peripheral artery disease)   CKD (chronic kidney disease) stage 3, GFR 30-59 ml/min   Acute renal failure superimposed on stage 3 chronic kidney disease   PAF (paroxysmal atrial fibrillation)   Pulmonary HTN   Cari CarawayChris Dickson Beeper: [phone] 02/15/2014

## 2014-02-16 ENCOUNTER — Other Ambulatory Visit: Payer: Self-pay

## 2014-02-16 ENCOUNTER — Inpatient Hospital Stay (HOSPITAL_COMMUNITY): Payer: Medicare Other

## 2014-02-16 ENCOUNTER — Inpatient Hospital Stay (HOSPITAL_COMMUNITY): Payer: Medicare Other | Admitting: Certified Registered"

## 2014-02-16 ENCOUNTER — Encounter (HOSPITAL_COMMUNITY): Payer: Self-pay | Admitting: Vascular Surgery

## 2014-02-16 DIAGNOSIS — J9600 Acute respiratory failure, unspecified whether with hypoxia or hypercapnia: Secondary | ICD-10-CM | POA: Insufficient documentation

## 2014-02-16 DIAGNOSIS — R0600 Dyspnea, unspecified: Secondary | ICD-10-CM

## 2014-02-16 DIAGNOSIS — R0689 Other abnormalities of breathing: Secondary | ICD-10-CM

## 2014-02-16 DIAGNOSIS — I469 Cardiac arrest, cause unspecified: Secondary | ICD-10-CM

## 2014-02-16 LAB — PROTIME-INR
INR: 1.27 (ref 0.00–1.49)
Prothrombin Time: 16 seconds — ABNORMAL HIGH (ref 11.6–15.2)

## 2014-02-16 LAB — COMPREHENSIVE METABOLIC PANEL
ALT: 12 U/L (ref 0–35)
AST: 16 U/L (ref 0–37)
Albumin: 2.3 g/dL — ABNORMAL LOW (ref 3.5–5.2)
Alkaline Phosphatase: 103 U/L (ref 39–117)
Anion gap: 23 — ABNORMAL HIGH (ref 5–15)
BUN: 71 mg/dL — ABNORMAL HIGH (ref 6–23)
CALCIUM: 9.1 mg/dL (ref 8.4–10.5)
CO2: 19 mEq/L (ref 19–32)
Chloride: 87 mEq/L — ABNORMAL LOW (ref 96–112)
Creatinine, Ser: 4.22 mg/dL — ABNORMAL HIGH (ref 0.50–1.10)
GFR calc non Af Amer: 9 mL/min — ABNORMAL LOW (ref >90)
GFR, EST AFRICAN AMERICAN: 11 mL/min — AB (ref >90)
GLUCOSE: 123 mg/dL — AB (ref 70–99)
Potassium: 4.4 mEq/L (ref 3.7–5.3)
Sodium: 129 mEq/L — ABNORMAL LOW (ref 137–147)
TOTAL PROTEIN: 6.7 g/dL (ref 6.0–8.3)
Total Bilirubin: 0.2 mg/dL — ABNORMAL LOW (ref 0.3–1.2)

## 2014-02-16 LAB — POCT I-STAT 3, ART BLOOD GAS (G3+)
ACID-BASE DEFICIT: 8 mmol/L — AB (ref 0.0–2.0)
BICARBONATE: 19.5 meq/L — AB (ref 20.0–24.0)
O2 Saturation: 98 %
PO2 ART: 128 mmHg — AB (ref 80.0–100.0)
Patient temperature: 98.6
TCO2: 21 mmol/L (ref 0–100)
pCO2 arterial: 45.8 mmHg — ABNORMAL HIGH (ref 35.0–45.0)
pH, Arterial: 7.236 — ABNORMAL LOW (ref 7.350–7.450)

## 2014-02-16 LAB — FIBRINOGEN: FIBRINOGEN: 546 mg/dL — AB (ref 204–475)

## 2014-02-16 LAB — CBC
HCT: 26 % — ABNORMAL LOW (ref 36.0–46.0)
HEMOGLOBIN: 8.2 g/dL — AB (ref 12.0–15.0)
MCH: 26.3 pg (ref 26.0–34.0)
MCHC: 31.5 g/dL (ref 30.0–36.0)
MCV: 83.3 fL (ref 78.0–100.0)
Platelets: 198 10*3/uL (ref 150–400)
RBC: 3.12 MIL/uL — ABNORMAL LOW (ref 3.87–5.11)
RDW: 15.6 % — ABNORMAL HIGH (ref 11.5–15.5)
WBC: 7.2 10*3/uL (ref 4.0–10.5)

## 2014-02-16 LAB — GLUCOSE, CAPILLARY
GLUCOSE-CAPILLARY: 132 mg/dL — AB (ref 70–99)
GLUCOSE-CAPILLARY: 154 mg/dL — AB (ref 70–99)
GLUCOSE-CAPILLARY: 94 mg/dL (ref 70–99)

## 2014-02-16 LAB — TROPONIN I: Troponin I: <0.3 ng/mL (ref <0.30)

## 2014-02-16 LAB — TYPE AND SCREEN
ABO/RH(D): O NEG
Antibody Screen: NEGATIVE

## 2014-02-16 LAB — APTT: aPTT: 78 seconds — ABNORMAL HIGH (ref 24–37)

## 2014-02-16 LAB — PROCALCITONIN: Procalcitonin: 0.55 ng/mL

## 2014-02-16 LAB — LACTIC ACID, PLASMA
Lactic Acid, Venous: 2.7 mmol/L — ABNORMAL HIGH (ref 0.5–2.2)
Lactic Acid, Venous: 1.3 mmol/L (ref 0.5–2.2)

## 2014-02-16 LAB — CORTISOL: Cortisol, Plasma: 49.8 ug/dL

## 2014-02-16 LAB — AMMONIA: AMMONIA: 24 umol/L (ref 11–60)

## 2014-02-16 LAB — HEPARIN LEVEL (UNFRACTIONATED): HEPARIN UNFRACTIONATED: 0.62 [IU]/mL (ref 0.30–0.70)

## 2014-02-16 MED ORDER — VANCOMYCIN HCL IN DEXTROSE 1-5 GM/200ML-% IV SOLN
1000.0000 mg | INTRAVENOUS | Status: DC
  Administered 2014-02-16: 1000 mg via INTRAVENOUS
  Filled 2014-02-16: qty 200

## 2014-02-16 MED ORDER — VITAL HIGH PROTEIN PO LIQD
1000.0000 mL | ORAL | Status: DC
  Filled 2014-02-16 (×2): qty 1000

## 2014-02-16 MED ORDER — FUROSEMIDE 10 MG/ML IJ SOLN
80.0000 mg | Freq: Three times a day (TID) | INTRAMUSCULAR | Status: DC
  Administered 2014-02-16 (×2): 80 mg via INTRAVENOUS
  Filled 2014-02-16 (×2): qty 8

## 2014-02-16 MED ORDER — FENTANYL CITRATE 0.05 MG/ML IJ SOLN
50.0000 ug | INTRAMUSCULAR | Status: DC | PRN
  Administered 2014-02-16 (×3): 50 ug via INTRAVENOUS
  Filled 2014-02-16 (×3): qty 2

## 2014-02-16 MED ORDER — INSULIN ASPART 100 UNIT/ML ~~LOC~~ SOLN: 1.0000 [IU] | SUBCUTANEOUS | Status: DC

## 2014-02-16 MED ORDER — FENTANYL CITRATE 0.05 MG/ML IJ SOLN
50.0000 ug | INTRAMUSCULAR | Status: DC | PRN
  Administered 2014-02-16 (×2): 50 ug via INTRAVENOUS
  Filled 2014-02-16 (×2): qty 2

## 2014-02-16 MED ORDER — PIPERACILLIN-TAZOBACTAM IN DEX 2-0.25 GM/50ML IV SOLN
2.2500 g | Freq: Three times a day (TID) | INTRAVENOUS | Status: DC
  Administered 2014-02-16: 2.25 g via INTRAVENOUS
  Filled 2014-02-16 (×3): qty 50

## 2014-02-16 MED ORDER — PRO-STAT SUGAR FREE PO LIQD
30.0000 mL | Freq: Two times a day (BID) | ORAL | Status: DC
  Filled 2014-02-16 (×2): qty 30

## 2014-02-16 MED ORDER — BISACODYL 10 MG RE SUPP: 10.0000 mg | Freq: Every day | RECTAL | Status: DC | PRN

## 2014-02-16 MED ORDER — SODIUM CHLORIDE 0.9 % IV SOLN
10.0000 ug/h | INTRAVENOUS | Status: DC
  Filled 2014-02-16: qty 50

## 2014-02-16 MED ORDER — PANTOPRAZOLE SODIUM 40 MG IV SOLR: 40.0000 mg | Freq: Every day | INTRAVENOUS | Status: DC

## 2014-02-16 MED ORDER — HYDRALAZINE HCL 25 MG PO TABS
25.0000 mg | ORAL_TABLET | Freq: Three times a day (TID) | ORAL | Status: DC
  Filled 2014-02-16 (×3): qty 1

## 2014-02-16 MED ORDER — FENTANYL BOLUS VIA INFUSION
50.0000 ug | INTRAVENOUS | Status: DC | PRN
  Filled 2014-02-16: qty 200

## 2014-02-16 MED ORDER — HEPARIN SODIUM (PORCINE) 5000 UNIT/ML IJ SOLN: 5000.0000 [IU] | Freq: Three times a day (TID) | INTRAMUSCULAR | Status: DC

## 2014-02-16 MED ORDER — CETYLPYRIDINIUM CHLORIDE 0.05 % MT LIQD
7.0000 mL | Freq: Four times a day (QID) | OROMUCOSAL | Status: DC
  Administered 2014-02-16 (×2): 7 mL via OROMUCOSAL

## 2014-02-16 MED ORDER — SODIUM CHLORIDE 0.9 % IV SOLN
100.0000 ug/h | INTRAVENOUS | Status: DC
  Administered 2014-02-16: 100 ug/h via INTRAVENOUS
  Filled 2014-02-16 (×3): qty 50

## 2014-02-16 MED ORDER — FENTANYL CITRATE 0.05 MG/ML IJ SOLN: 100.0000 ug | Freq: Once | INTRAMUSCULAR | Status: DC

## 2014-02-16 MED ORDER — HYDRALAZINE HCL 20 MG/ML IJ SOLN
10.0000 mg | INTRAMUSCULAR | Status: DC | PRN
  Administered 2014-02-16: 10 mg via INTRAVENOUS
  Filled 2014-02-16: qty 1

## 2014-02-16 MED ORDER — SODIUM CHLORIDE 0.9 % IV BOLUS (SEPSIS)
1000.0000 mL | INTRAVENOUS | Status: DC
  Administered 2014-02-16: 1000 mL via INTRAVENOUS

## 2014-02-16 MED ORDER — CHLORHEXIDINE GLUCONATE 0.12 % MT SOLN
15.0000 mL | Freq: Two times a day (BID) | OROMUCOSAL | Status: DC
  Administered 2014-02-16: 15 mL via OROMUCOSAL
  Filled 2014-02-16: qty 15

## 2014-02-16 MED ORDER — SODIUM CHLORIDE 0.9 % IV SOLN: 250.0000 mL | INTRAVENOUS | Status: DC | PRN

## 2014-02-16 MED FILL — Medication: Qty: 1 | Status: AC

## 2014-02-17 NOTE — Progress Notes (Signed)
200 ml of Fentanyl 2500 mcg in 0.9 % 250 ml bag wasted. Witnessed by Mal MistyGregory Jarod RN

## 2014-02-18 ENCOUNTER — Ambulatory Visit: Payer: Medicare Other | Admitting: Vascular Surgery

## 2014-02-18 LAB — CULTURE, RESPIRATORY W GRAM STAIN

## 2014-02-18 LAB — CULTURE, RESPIRATORY

## 2014-02-22 LAB — CULTURE, BLOOD (ROUTINE X 2)
CULTURE: NO GROWTH
Culture: NO GROWTH

## 2014-03-06 NOTE — H&P (Signed)
Physical medicine and rehabilitation consult requested with chart reviewed. Patient status post left AKA [facility] Physical and occupational therapy evaluations yet to be ordered. We will hold on formal rehabilitation consult at this time to establish discharge plan please reconsult as needed

## 2014-03-06 NOTE — Progress Notes (Signed)
NUTRITION FOLLOW UP / CONSULT  INTERVENTION: Initiate TF via OGT with Vital High Protein at 25 ml/h and Prostat 30 ml BID on day 1; on day 2, d/c Prostat and increase to goal rate of 60 ml/h (1440 ml per day) to provide 1440 kcals, 126 gm protein, 1204 ml free water daily.  NUTRITION DIAGNOSIS: Increased nutrient needs related to wound healing as evidenced by estimated nutrition needs, ongoing.  Goal: Pt to meet >/= 90% of their estimated nutrition needs, unmet.   Monitor:  TF tolerance/adequacy, weight trend, labs, vent status.  ASSESSMENT: 80 y/o woman w/ multiple medical problems, POD #3 from LLE AKA complicated by dilerium and AF w/ RVR, who developed bradycardia and then cardiac arrest. Required intubation on 12/14.  S/P left AKA on 12/11. Discussed patient in ICU rounds today.  Patient is currently intubated on ventilator support MV: 12 L/min Temp (24hrs), Avg:96.9 F (36.1 C), Min:96.2 F (35.7 C), Max:97.5 F (36.4 C)  Propofol: none   Height: Ht Readings from Last 1 Encounters:  02/12/14 5\' 6"  (1.676 m)    Weight: Wt Readings from Last 1 Encounters:  02/11/2014 161 lb 9.6 oz (73.3 kg)   Pre-amputation weight: 02/11/14 180 lb (81.647 kg)    BMI:  27.4 (using equation for amputations)  Estimated Nutritional Needs: Kcal: 1429 Protein: 110-125 g Fluid: 2 L/day  Skin: left thigh AKA site, stage I pressure ulcer on sacrum  Diet Order: Diet NPO time specified   Intake/Output Summary (Last 24 hours) at 02/20/2014 1030 Last data filed at 03/02/2014 1000  Gross per 24 hour  Intake 2765.6 ml  Output      0 ml  Net 2765.6 ml    Labs:   Recent Labs Lab 02/24/2014 0336 02/14/14 0312 02/15/14 0315 02/05/2014 0315  NA 131* 131* 128* 129*  K 4.5 4.8 4.9 4.4  CL 88* 88* 85* 87*  CO2 27 25 21 19   BUN 61* 62* 65* 71*  CREATININE 3.32* 3.66* 4.02* 4.22*  CALCIUM 9.0 8.9 9.1 9.1  MG 2.2 2.2  --   --   GLUCOSE 156* 93 113* 123*    CBG (last 3)   Recent  Labs  02/15/14 1649 02/15/14 2123 03/03/2014 0459  GLUCAP 124* 162* 132*    Scheduled Meds: . antiseptic oral rinse  7 mL Mouth Rinse QID  . chlorhexidine  15 mL Mouth Rinse BID  . insulin aspart  0-15 Units Subcutaneous TID AC & HS  . pantoprazole (PROTONIX) IV  40 mg Intravenous QHS  . piperacillin-tazobactam (ZOSYN)  IV  2.25 g Intravenous 3 times per day  . vancomycin  1,000 mg Intravenous Q48H    Continuous Infusions: . heparin 1,550 Units/hr (02/21/2014 0800)    Past Medical History  Diagnosis Date  . Paroxysmal atrial fibrillation     CHADS2 (4). Coumadin Rx  . Hypertension   . Stroke     dysarthria, resolved 2009  . Dyslipidemia   . Peripheral neuropathy   . Amputation, traumatic, toes     two toes right foot  . Cataract 2011  . Ejection fraction     EF 60-65% 09/2007, echo  . Warfarin anticoagulation     Atrial fib  . Vertigo     Vertigo with a fall 03/2012  . IDDM (insulin dependent diabetes mellitus)   . Complication of anesthesia     "11/2013 "got Ativan & Morphine; got wild"  . CHF (congestive heart failure)   . COPD (chronic obstructive pulmonary disease)   .  GERD (gastroesophageal reflux disease)   . TIA (transient ischemic attack) 2008   . Arthritis     "fingers, toes, feet, legs, all over I reckon"  . Chronic renal insufficiency   . Kidney stones     "passed them"  . Chronic kidney disease (CKD), stage IV (severe)     Past Surgical History  Procedure Laterality Date  . Appendectomy    . Cholecystectomy    . Toe amputation Right 2009    "big toe; little toe  . Tonsillectomy    . Toe amputation  11/2013    1st and 2nd digits  . Cataract extraction Right     Joaquin CourtsKimberly Ursala Cressy, RD, LDN, CNSC Pager [phone] After Hours Pager [phone]

## 2014-03-06 NOTE — Progress Notes (Signed)
RT and RN made pt comfortable and then per MD order terminally extubated pt.

## 2014-03-06 NOTE — Code Documentation (Signed)
CODE BLUE NOTE  Patient Name: [name]   MRN: [id number]   Date of Birth/ Sex: 04/01/1934 , female      Admission Date: 02/27/2014  Attending Provider: Lonia BloodJeffrey T McClung, MD  Primary Diagnosis: Foot ulcer, left    Indication: Pt was in her usual state of health until this AM, when she was noted to be asystole. Code blue was subsequently called. At the time of arrival on scene, ACLS protocol was underway.    Technical Description:  - CPR performance duration:  11  minutes  - Was defibrillation or cardioversion used? No   - Was external pacer placed? No  - Was patient intubated pre/post CPR? Yes    Medications Administered: Y = Yes; Blank = No Amiodarone  Y  Atropine  Y  Calcium    Epinephrine  Y  Lidocaine    Magnesium    Norepinephrine    Phenylephrine    Sodium bicarbonate  Y  Vasopressin      Post CPR evaluation:  - Final Status - Was patient successfully resuscitated ? Yes - What is current rhythm? NSR - What is current hemodynamic status? stable   Miscellaneous Information:  - Labs sent, including: Lactic acid, CMP, trop  - Primary team notified?  Yes  - Family Notified? Refer to CC  - Additional notes/ transfer status: Transfer to ICU   Kathee DeltonIan D McKeag, MD  02/24/2014, 4:27 AM

## 2014-03-06 NOTE — Progress Notes (Signed)
eLink Physician-Brief Progress Note Patient Name: Garey HamMona P Dimaano DOB: 01/05/1935 MRN: [id number]   Date of Service  02/11/2014  HPI/Events of Note    eICU Interventions  Extubate to comfort Withdrawal orders written & d/w RN     Intervention Category Major Interventions: End of life / care limitation discussion  ALVA,RAKESH V. 02/15/2014, 9:59 PM

## 2014-03-06 NOTE — Progress Notes (Addendum)
  [facility] Progress Note  03/05/2014 9:40 AM 3 Days Post-Op  Subjective: Awake on vent.   Filed Vitals:   02/09/2014 0851  BP:   Pulse:   Temp: 97.5 F (36.4 C)  Resp:     Physical Exam: Incisions:  Dressing change by nursing staff this am. Serous drainage on dressing. No active draining from stump. Staple line intact. Minor "pink" color around staple line.   CBC    Component Value Date/Time   WBC 7.2 02/24/2014 0315   RBC 3.12* 02/12/2014 0315   RBC 2.90* 02/12/2014 1400   HGB 8.2* 02/10/2014 0315   HCT 26.0* 02/06/2014 0315   PLT 198 03/02/2014 0315   MCV 83.3 02/12/2014 0315   MCH 26.3 02/24/2014 0315   MCHC 31.5 03/03/2014 0315   RDW 15.6* 02/09/2014 0315   LYMPHSABS 0.9 June 19, 2013 0336   MONOABS 0.6 June 19, 2013 0336   EOSABS 0.2 June 19, 2013 0336   BASOSABS 0.0 June 19, 2013 0336    BMET    Component Value Date/Time   NA 129* 02/25/2014 0315   K 4.4 02/27/2014 0315   CL 87* 02/10/2014 0315   CO2 19 02/10/2014 0315   GLUCOSE 123* 02/24/2014 0315   BUN 71* 02/06/2014 0315   CREATININE 4.22* 02/22/2014 0315   CALCIUM 9.1 02/27/2014 0315   GFRNONAA 9* 02/10/2014 0315   GFRAA 11* 02/18/2014 0315    INR    Component Value Date/Time   INR 1.27 02/18/2014 0530   INR 2.3 11/21/2013 1314   INR 3.0 05/10/2010 1414     Intake/Output Summary (Last 24 hours) at 02/25/2014 0940 Last data filed at 02/21/2014 0600  Gross per 24 hour  Intake   2558 ml  Output      0 ml  Net   2558 ml     Assessment:  80 y.o. female is s/p: left AKA   3 Days Post-Op  Plan: -Dressing changed this am. Per RN, no active drainage of left stump. Some serous drainage on old dressing. Staples intact. -Continue daily dressing changes. Will order retention sock.    Maris BergerKimberly Trinh, PA-C [facility] Office: [phone] Pager: [phone] 02/13/2014 9:40 AM  Agree with above. Events noted. Left AKA healing adequately.  Waverly Ferrarihristopher  Tejal Monroy, MD, FACS Beeper [phone] 02/11/2014

## 2014-03-06 NOTE — Progress Notes (Signed)
ANTIBIOTIC CONSULT NOTE - INITIAL  Pharmacy Consult for Vancomycin/Zosyn  Indication: rule out sepsis  Allergies  Allergen Reactions  . Ativan [Lorazepam] Other (See Comments)    Unknown  . Morphine And Related Other (See Comments)    MAR  . Xanax Weber CooksXr [Alprazolam Er]     Oversedation     Patient Measurements: Height: 5\' 6"  (167.6 cm) Weight: 161 lb 9.6 oz (73.3 kg) IBW/kg (Calculated) : 59.3  Vital Signs: Temp: 96.8 F (36 C) (12/13 2341) Temp Source: Axillary (12/13 2341) BP: 127/53 mmHg (12/14 0500) Pulse Rate: 68 (12/14 0500)  Labs:  Recent Labs  02/14/14 0312 02/15/14 0315 03/03/2014 0315  WBC  --  7.2 7.2  HGB  --  8.1* 8.2*  PLT  --  173 198  CREATININE 3.66* 4.02* 4.22*   Microbiology: Recent Results (from the past 720 hour(s))  Surgical pcr screen     Status: None   Collection Time: 03/04/2014 12:17 AM  Result Value Ref Range Status   MRSA, PCR NEGATIVE NEGATIVE Final   Staphylococcus aureus NEGATIVE NEGATIVE Final    Comment:        The Xpert SA Assay (FDA approved for NASAL specimens in patients over 78 years of age), is one component of a comprehensive surveillance program.  Test performance has been validated by [facility] for patients greater than or equal to 238 year old. It is not intended to diagnose infection nor to guide or monitor treatment.     Medical History: Past Medical History  Diagnosis Date  . Paroxysmal atrial fibrillation     CHADS2 (4). Coumadin Rx  . Hypertension   . Stroke     dysarthria, resolved 2009  . Dyslipidemia   . Peripheral neuropathy   . Amputation, traumatic, toes     two toes right foot  . Cataract 2011  . Ejection fraction     EF 60-65% 09/2007, echo  . Warfarin anticoagulation     Atrial fib  . Vertigo     Vertigo with a fall 03/2012  . IDDM (insulin dependent diabetes mellitus)   . Complication of anesthesia     "11/2013 "got Ativan & Morphine; got wild"  . CHF (congestive heart failure)   .  COPD (chronic obstructive pulmonary disease)   . GERD (gastroesophageal reflux disease)   . TIA (transient ischemic attack) 2008   . Arthritis     "fingers, toes, feet, legs, all over I reckon"  . Chronic renal insufficiency   . Kidney stones     "passed them"  . Chronic kidney disease (CKD), stage IV (severe)     Assessment: 80 y/o F s/p CODE BLUE to start broad spectrum antibiotics for possible sepsis. WBC WNL, CrCl 10-20 ml/min, other labs as above.   Goal of Therapy:  Vancomycin trough level 15-20 mcg/ml  Plan:  -Vancomycin 1000 mg IV q48h -Zosyn 2.25g IV q8h -Trend WBC, temp, renal function  -Drug levels as indicated   Abran DukeLedford, Jsean Taussig 02/24/2014,6:09 AM

## 2014-03-06 NOTE — Progress Notes (Signed)
MD Cyril Mourningakesh Alva was notified in regards to extubation per family request for comfort care, MD Vassie LollAlva ordered extubation. Family at beside and calm . Pt on 300 mcg of Fentanyl and comfortable during extubation at 2200. Pt remained comfortable. Pt did expire. No breath sounds, heart sounds upon auscultation. Asystole on Monitor. Time of Death 2241, 12/14. CDS called. MD Vassie LollAlva notified.

## 2014-03-06 NOTE — Procedures (Addendum)
I have had extensive discussions with family sons. We discussed patients current circumstances and organ failures. We also discussed patient's prior wishes under circumstances such as this. Family has decided to NOT perform resuscitation if arrest but to continue current medical support for now. We discussed the poor prognosis and likely poor quality of life. Family has decided to offer full comfort care. They are aware that the patient may be transferred to palliative care floor for continued comfort care needs. They have been fully updated on the process and expectations.  Mcarthur Rossettianiel J. Tyson AliasFeinstein, MD, FACP Pgr: [phone] [facility]   totoal ccm time now 65 min

## 2014-03-06 NOTE — Progress Notes (Signed)
Progress Note from the Palliative Medicine Team at [facility]: I met today with Ms. Granquist's family (sister, 2 sons, and daughter-in-law) and Dr. Titus Mould. Dr. Lenoria Farrier had already began discussion on Ms. Turck's grave status and very poor prognosis and family has decided to focus on comfort care and forego further aggressive life-prolonging measures. They plan to give her grandchildren the opportunity to visit with her and they will proceed with termination of ventilator support likely later this evening. Family is very tearful but tell me they know this is the right thing to do for her now. The family discussed how they never had the chance to tell her that her brother had died ~ 1 week ago and that they know she will be happy to see him in heaven. I will continue to follow and support Ms. Brunker and her family.    Objective: Allergies  Allergen Reactions  . Ativan [Lorazepam] Other (See Comments)    Unknown  . Morphine And Related Other (See Comments)    MAR  . Xanax Xr [Alprazolam Er]     Oversedation    Scheduled Meds: . antiseptic oral rinse  7 mL Mouth Rinse QID  . chlorhexidine  15 mL Mouth Rinse BID  . feeding supplement (PRO-STAT SUGAR FREE 64)  30 mL Per Tube BID  . feeding supplement (VITAL HIGH PROTEIN)  1,000 mL Per Tube Q24H  . furosemide  80 mg Intravenous TID PC  . heparin subcutaneous  5,000 Units Subcutaneous 3 times per day  . hydrALAZINE  25 mg Per Tube 3 times per day  . insulin aspart  1-3 Units Subcutaneous 6 times per day  . pantoprazole (PROTONIX) IV  40 mg Intravenous QHS  . piperacillin-tazobactam (ZOSYN)  IV  2.25 g Intravenous 3 times per day  . vancomycin  1,000 mg Intravenous Q48H   Continuous Infusions:  PRN Meds:.sodium chloride, fentaNYL, fentaNYL, hydrALAZINE, ipratropium-albuterol, ondansetron **OR** ondansetron (ZOFRAN) IV, sorbitol, traMADol  BP 172/51 mmHg  Pulse 73  Temp(Src) 97.8 F (36.6 C) (Oral)  Resp 24  Ht 5'  6" (1.676 m)  Wt 73.3 kg (161 lb 9.6 oz)  BMI 26.09 kg/m2  SpO2 100%   PPS: 10% - sedated on vent   Intake/Output Summary (Last 24 hours) at 13-Mar-2014 1309 Last data filed at 03/13/14 1000  Gross per 24 hour  Intake 2661.2 ml  Output      0 ml  Net 2661.2 ml      LBM: 02/09/14  Physical Exam:  General: Distressed/agitated on vent, acutely ill HEENT: ETT to vent, moist mucous membranes Chest: Rhonchi throughout CVS: RRR Abdomen: Soft, NT, ND Ext: MAE, s/p left AKA, dressing CDI Neuro: Sedated on vent, eyes closed but restless/agitated to stimulation  Labs: CBC    Component Value Date/Time   WBC 7.2 03-13-2014 0315   RBC 3.12* 2014-03-13 0315   RBC 2.90* 02/12/2014 1400   HGB 8.2* 2014-03-13 0315   HCT 26.0* 2014-03-13 0315   PLT 198 03-13-2014 0315   MCV 83.3 Mar 13, 2014 0315   MCH 26.3 Mar 13, 2014 0315   MCHC 31.5 2014/03/13 0315   RDW 15.6* 03-13-14 0315   LYMPHSABS 0.9 02/12/2014 0336   MONOABS 0.6 03/04/2014 0336   EOSABS 0.2 03/02/2014 0336   BASOSABS 0.0 02/21/2014 0336    BMET    Component Value Date/Time   NA 129* 03-13-2014 0315   K 4.4 2014-03-13 0315   CL 87* 03/13/14 0315   CO2 19 Mar 13, 2014 0315  GLUCOSE 123* 2014/02/28 0315   BUN 71* February 28, 2014 0315   CREATININE 4.22* 02/28/14 0315   CALCIUM 9.1 02/28/14 0315   GFRNONAA 9* 02/28/14 0315   GFRAA 11* 2014-02-28 0315    CMP     Component Value Date/Time   NA 129* 02-28-14 0315   K 4.4 Feb 28, 2014 0315   CL 87* 02/28/14 0315   CO2 19 2014/02/28 0315   GLUCOSE 123* 02/28/2014 0315   BUN 71* 2014/02/28 0315   CREATININE 4.22* 02/28/2014 0315   CALCIUM 9.1 2014-02-28 0315   PROT 6.7 02-28-2014 0315   ALBUMIN 2.3* 2014-02-28 0315   AST 16 2014/02/28 0315   ALT 12 2014/02/28 0315   ALKPHOS 103 02/28/2014 0315   BILITOT 0.2* 02/28/14 0315   GFRNONAA 9* 02-28-14 0315   GFRAA 11* 02/28/2014 0315    Assessment and Plan: 1. Code Status: DNR 2. Symptom  Control: 1. Agitation/pain/dyspnea (goal RR < 25):  1. Recommend initiation of fentanyl infusion at 50 mcg/hr.  2. Titrate infusion to comfort by 25-50 mcg every 30 minutes as needed.  3. Bolus at a dose that is 25-50% the hourly rate via infusion every 15 minutes as needed.  3. Psycho/Social: Emotional support provided to family.  4. Spiritual: Chaplain presence offered but not desired at this time.  5. Disposition: To be determined on outcomes. Likely hospital death.     Time In Time Out Total Time Spent with Patient Total Overall Time  1300 1325 55mn 277m    Greater than 50%  of this time was spent counseling and coordinating care related to the above assessment and plan.  AlVinie SillNP Palliative Medicine Team Pager # [phone]-F 8a-5p) Team Phone # [phone]/Weekends)

## 2014-03-06 NOTE — Progress Notes (Signed)
Chaplain responded to consult for end of life with pt's family.  Pt is transitioning to comfort care this afternoon.  Chaplain met with family in conference room.  Pt lost several siblings over the course of a year or so.  Family experiencing a lot of loss.  Pt's sister shared that her salvation theology is that the pt is "saved" and "going to heaven" to be with their other siblings.  Chaplain provided emotional and spiritual support to family as well as ministry of prayer and empathetic listening.  Chaplain will follow up as needed.    02-26-2014 1300  Clinical Encounter Type  Visited With Family;Health care provider  Visit Type Follow-up;Spiritual support;Critical Care;Patient actively dying  Referral From Physician  Spiritual Encounters  Spiritual Needs Prayer;Emotional;Grief support  Stress Factors  Family Stress Factors Exhausted;Family relationships;Health changes  Advance Directives (For Healthcare)  Does patient have an advance directive? Yes   Geralyn Flash

## 2014-03-06 NOTE — H&P (Signed)
PULMONARY / CRITICAL CARE MEDICINE HISTORY AND PHYSICAL EXAMINATION   Name: [name] MRN: [id number] DOB: 05-20-1934    ADMISSION DATE:  02/27/2014  PRIMARY SERVICE: PCCM  CHIEF COMPLAINT:  Cardiac Arrest  BRIEF PATIENT DESCRIPTION: 80 y/o woman w/ MMP, POD #3 from LLE AKA c/b dilerium and AF w/ RVR, who developed bradycardia and then cardiac arrest.  SIGNIFICANT EVENTS / STUDIES:  CHF exacerbation - 11/23 Delirium w/ hypercarbia - 12/3 Left AKA - 02/06/2014   LINES / TUBES: 8 mm ETT - 03/06/14 PIV x3   CULTURES: Blood 03-06-2014 - (not yet drawn) Urine 2014-03-06 - (not yet collected)   ANTIBIOTICS: Vanc - 03/06/14 -> Zoysn - - 2014-03-06 ->  HISTORY OF PRESENT ILLNESS:   [name] is an unfortunate 80 y/o woman with a history of PVD, paroxsmal AF, HTN, Stroke, ischemic cardiomyopathy, DM2, COPD, CKD III who has been battling complications of foot uclers for the last several months. In September, she had her left 1st and 2nd toe amputated for nonhealing wounds. She continued to develop foot wounds with poor healing and she was going to undergo an arteriogram to evaluate for intervenable lesions on 11/23, but developed symptoms of CHF exacerbation, and this was canceled. She was admitted and diuresed, and plans were made for eventual AKA, although the patient was apparently reluctant to agree to this. This procedure was also canceled on 12/3 due to a number of concerns about her candidacy, as well as delirium. She found to be somnolent w/ hypercarbia and was placed on BiPAP w/ good improvement by 12/5. Palliative care had been involved, and family meetings were had, with the patient's input when she was fully oriented. She desired to be full code at that time. She continued to diurese well, and surgery was planned for 02/27/2014, but this again had to be canceled as she developed AF w/ RVR. She was treated w/ amiodarone with eventual conversion to NSR. She did successfully undergo her  surgery 12/11, but was quite delirius afterward, becoming agitated and combative on 12/12. She was treated w/ haldol, but remained agitated. Early on 12/14, she developed bradycardia and PEA arrest. PCCM was called for further management.   PAST MEDICAL HISTORY :  Past Medical History  Diagnosis Date  . Paroxysmal atrial fibrillation     CHADS2 (4). Coumadin Rx  . Hypertension   . Stroke     dysarthria, resolved 2009  . Dyslipidemia   . Peripheral neuropathy   . Amputation, traumatic, toes     two toes right foot  . Cataract 2011  . Ejection fraction     EF 60-65% 09/2007, echo  . Warfarin anticoagulation     Atrial fib  . Vertigo     Vertigo with a fall 03/2012  . IDDM (insulin dependent diabetes mellitus)   . Complication of anesthesia     "11/2013 "got Ativan & Morphine; got wild"  . CHF (congestive heart failure)   . COPD (chronic obstructive pulmonary disease)   . GERD (gastroesophageal reflux disease)   . TIA (transient ischemic attack) 2008   . Arthritis     "fingers, toes, feet, legs, all over I reckon"  . Chronic renal insufficiency   . Kidney stones     "passed them"  . Chronic kidney disease (CKD), stage IV (severe)    Past Surgical History  Procedure Laterality Date  . Appendectomy    . Cholecystectomy    . Toe amputation Right 2009    "big toe;  little toe  . Tonsillectomy    . Toe amputation  11/2013    1st and 2nd digits  . Cataract extraction Right    Prior to Admission medications   Medication Sig Start Date End Date Taking? Authorizing Provider  acetaminophen (TYLENOL) 325 MG tablet Take 650 mg by mouth every 6 (six) hours as needed for mild pain.   Yes Historical Provider, MD  ALPRAZolam (XANAX) 0.25 MG tablet Take 0.25 mg by mouth 3 (three) times daily as needed for anxiety.    Yes Historical Provider, MD  amitriptyline (ELAVIL) 50 MG tablet Take 50 mg by mouth at bedtime.   Yes Historical Provider, MD  carvedilol (COREG) 6.25 MG tablet Take 1 tablet  (6.25 mg total) by mouth 2 (two) times daily with a meal. 01/30/14  Yes Starleen Armsawood S Elgergawy, MD  Cholecalciferol 2000 UNITS TABS Take 2,000 Units by mouth daily.   Yes Historical Provider, MD  collagenase (SANTYL) ointment Apply 1 application topically daily. Apply to left lower extremity   Yes Historical Provider, MD  darbepoetin (ARANESP) 60 MCG/0.3ML SOLN injection Inject 60 mcg into the skin every 7 (seven) days.   Yes Historical Provider, MD  furosemide (LASIX) 40 MG tablet Take 40 mg by mouth 2 (two) times daily.    Yes Historical Provider, MD  guaiFENesin-dextromethorphan (ROBITUSSIN DM) 100-10 MG/5ML syrup Take 15 mLs by mouth every 4 (four) hours as needed for cough. 01/30/14  Yes Starleen Armsawood S Elgergawy, MD  hydrALAZINE (APRESOLINE) 50 MG tablet Take 1 tablet (50 mg total) by mouth 3 (three) times daily. 01/30/14  Yes Starleen Armsawood S Elgergawy, MD  HYDROcodone-acetaminophen (NORCO/VICODIN) 5-325 MG per tablet Take 1 tablet by mouth every 8 (eight) hours as needed (for pain).   Yes Historical Provider, MD  hydrocortisone (ANUSOL-HC) 2.5 % rectal cream Place 1 application rectally every 6 (six) hours as needed for hemorrhoids.   Yes Historical Provider, MD  ipratropium-albuterol (DUONEB) 0.5-2.5 (3) MG/3ML SOLN Take 3 mLs by nebulization every 6 (six) hours as needed. 01/30/14  Yes Starleen Armsawood S Elgergawy, MD  iron polysaccharides (NIFEREX) 150 MG capsule Take 150 mg by mouth 2 (two) times daily with a meal.    Yes Historical Provider, MD  isosorbide dinitrate (ISORDIL) 40 MG tablet Take 1 tablet (40 mg total) by mouth 3 (three) times daily. 01/30/14  Yes Starleen Armsawood S Elgergawy, MD  labetalol (NORMODYNE) 100 MG tablet Take 100 mg by mouth 2 (two) times daily.   Yes Historical Provider, MD  ondansetron (ZOFRAN) 4 MG/2ML SOLN injection Inject 2 mLs (4 mg total) into the vein every 6 (six) hours as needed for nausea or vomiting. 01/30/14  Yes Starleen Armsawood S Elgergawy, MD  vitamin C (ASCORBIC ACID) 500 MG tablet Take 500 mg  by mouth daily.   Yes Historical Provider, MD  zinc sulfate 220 MG capsule Take 220 mg by mouth daily.   Yes Historical Provider, MD  cyanocobalamin 500 MCG tablet Take 1 tablet (500 mcg total) by mouth daily. 01/30/14   Leana Roeawood S Elgergawy, MD  insulin aspart (NOVOLOG) 100 UNIT/ML injection Inject 0-15 Units into the skin 3 (three) times daily with meals. 01/30/14   Leana Roeawood S Elgergawy, MD  ipratropium-albuterol (DUONEB) 0.5-2.5 (3) MG/3ML SOLN Take 3 mLs by nebulization every 6 (six) hours.     Historical Provider, MD  pantoprazole (PROTONIX) 40 MG tablet Take 1 tablet (40 mg total) by mouth daily. 01/30/14   Leana Roeawood S Elgergawy, MD  phytonadione (VITAMIN K) 5 MG tablet Take 5 mg  by mouth once.    Historical Provider, MD  warfarin (COUMADIN) 4 MG tablet Take 1 tablet (4 mg total) by mouth daily. Please hold warfarin for tonight 11/27, will recheck INR level on 11/28, and resume warfarin as per pharmacy/physician recommendation. 01/31/14   Starleen Arms, MD   Allergies  Allergen Reactions  . Ativan [Lorazepam] Other (See Comments)    Unknown  . Morphine And Related Other (See Comments)    MAR  . Xanax [employer]s Er]     Oversedation     FAMILY HISTORY:  Family History  Problem Relation Age of Onset  . Coronary artery disease      FAMILY H/O  . Heart failure Mother   . Peripheral vascular disease Father   . Diabetes Father   . Hypertension Son    SOCIAL HISTORY:  reports that she has never smoked. She has never used smokeless tobacco. She reports that she does not drink alcohol or use illicit drugs.  REVIEW OF SYSTEMS:  Unable to obtain  SUBJECTIVE:   VITAL SIGNS: Temp:  [96.2 F (35.7 C)-97.1 F (36.2 C)] 96.8 F (36 C) (12/13 2341) Pulse Rate:  [60-74] 69 (12/14 0000) Resp:  [12-21] 12 (12/14 0000) BP: (122-169)/(44-69) 124/45 mmHg (12/14 0000) SpO2:  [94 %-99 %] 99 % (12/14 0000) FiO2 (%):  [60 %] 60 % (12/14 0435) Weight:  [161 lb 9.6 oz (73.3 kg)] 161 lb 9.6  oz (73.3 kg) (12/14 0500) HEMODYNAMICS:   VENTILATOR SETTINGS: Vent Mode:  [-] PRVC FiO2 (%):  [60 %] 60 % Set Rate:  [20 bmp] 20 bmp Vt Set:  [480 mL] 480 mL PEEP:  [5 cmH20] 5 cmH20 Plateau Pressure:  [21 cmH20] 21 cmH20 INTAKE / OUTPUT: Intake/Output      12/13 0701 - 12/14 0700   I.V. (mL/kg) 2622.4 (35.8)   Total Intake(mL/kg) 2622.4 (35.8)   Net +2622.4         PHYSICAL EXAMINATION: General:  Elderly woman intubated lying in bed Neuro:  Agitated movements, not following commands. Moving all extremities. HEENT:  MMM Neck: No JVD Cardiovascular:  RRR Lungs:  CTAB Abdomen:  Soft, non-tender Musculoskeletal:  S/p L AKA. Small non-healing wounds on LLE as well. Skin:  No obvious rashes anteriorly.   LABS:  CBC  Recent Labs Lab 03-03-2014 0336 02/15/14 0315 02/22/2014 0315  WBC 6.0 7.2 7.2  HGB 7.7* 8.1* 8.2*  HCT 24.6* 25.2* 26.0*  PLT 167 173 198   Coag's  Recent Labs Lab 02/09/14 0812 02/03/2014 0400 02/14/14 0312  INR 1.24 1.21 1.18   BMET  Recent Labs Lab 02/14/14 0312 02/15/14 0315 03/04/2014 0315  NA 131* 128* 129*  K 4.8 4.9 4.4  CL 88* 85* 87*  CO2 25 21 19   BUN 62* 65* 71*  CREATININE 3.66* 4.02* 4.22*  GLUCOSE 93 113* 123*   Electrolytes  Recent Labs Lab 03-Mar-2014 0336 02/14/14 0312 02/15/14 0315 02/05/2014 0315  CALCIUM 9.0 8.9 9.1 9.1  MG 2.2 2.2  --   --    Sepsis Markers  Recent Labs Lab 02/18/2014 0420  LATICACIDVEN 2.7*   ABG  Recent Labs Lab 02/23/2014 0900  PHART 7.323*  PCO2ART 64.6*  PO2ART 54.7*   Liver Enzymes  Recent Labs Lab Mar 03, 2014 0336 02/14/14 0312 02/17/2014 0315  AST 15 20 16   ALT 11 12 12   ALKPHOS 80 97 103  BILITOT <0.2* 0.2* 0.2*  ALBUMIN 2.3* 2.2* 2.3*   Cardiac Enzymes  Recent Labs Lab 02/15/2014 0420  TROPONINI <0.30   Glucose  Recent Labs Lab 02/15/14 0415 02/15/14 0832 02/15/14 1330 02/15/14 1649 02/15/14 2123 02/06/2014 0459  GLUCAP 141* 122* 139* 124* 162* 132*     Imaging Dg Chest Port 1 View  02/14/2014   CLINICAL DATA:  Respiratory arrest.  Intubation.  EXAM: PORTABLE CHEST - 1 VIEW  COMPARISON:  03/05/2014  FINDINGS: New endotracheal tube, tip at the clavicular heads.  Increased hazy appearance of the mid and lower chest, likely layering of pleural effusions. There is underlying the lung opacification. Skin fold noted over the left apex; No definitive pneumothorax. Chronic cardiomegaly. Further evaluation of the heart and mediastinal contours is limited by distortion from rightward rotation.  IMPRESSION: 1. New endotracheal tube in good position. 2. Layering pleural effusions with basilar atelectasis. Cannot exclude superimposed pneumonia or aspiration.   Electronically Signed   By: Tiburcio Pea M.D.   On: 02/26/2014 05:03    EKG: Regular rate, cannot assess underlying rhythm due to quality/voltage. CXR: Poor quality; quite rotated. ETT in good position, bilateral pleural effusions.  ASSESSMENT / PLAN:  Principal Problem:   Foot ulcer, left Active Problems:   Essential hypertension   Diabetes mellitus   Warfarin anticoagulation   Chronic diastolic CHF (congestive heart failure)   PAD (peripheral artery disease)   CKD (chronic kidney disease) stage 3, GFR 30-59 ml/min   Acute renal failure superimposed on stage 3 chronic kidney disease   PAF (paroxysmal atrial fibrillation)   Pulmonary HTN   Cardiac arrest   PULMONARY A: COPD w/ chronic CO2 retention Need for mechanical ventilation Bilateral pleural effuions P:   Maintain on PRVC, elevated RR to compensate for metabolic acidosis.. Maintain euvolemia for now. Consider diuresis.  CARDIOVASCULAR A: S/p Cardiac Arrest Paroxysmal AF PVD w/ nonhealing ulcer P:   Not candidate for cooling due to rhythm of PEA and concern for sepsis, multiple comorbidities. Stopping amiodarone due to bradycardic arrest.  RENAL A: CKD III, at risk for severe renal failure P:   Avoiding  nephrotoxic drugs. Will trend Cr post-arrest and watch for ATN.  GASTROINTESTINAL A: No active issues P:   Tube feeds per nutrition recs.  HEMATOLOGIC A: Normocytic anemia P:   Likely due to chronic dz.  INFECTIOUS A: Possible sepsis due to lower extremity wounds P:   Delirium, lower HR, and low temps prior to arrest may have been sign of worsening SIRS. Will treat w/ vanc and zoysn empirically for now.  ENDOCRINE A: DM2 P:   Glycemic control protocol.  NEUROLOGIC A: Sedation for mechanical ventilation. S/p cardiac arrest Delirium P:   Phase 1 protocol for sedation Will see how patient progresses   BEST PRACTICE / DISPOSITION Level of Care:  ICU Primary Service:  PCCM Consultants:  Cards, vascular sgy Code Status:  Full Diet: Tube feeds DVT Px:  On hep gtt GI Px:  PPI Skin Integrity:  Per RN documentation Social / Family:  Transferring team discussed. PCCM needs to meet w/ family still  TODAY'S SUMMARY: 80 y/o woman with multiple complications surrounding lower extremity wounds presenting w/ cardiac arrest.  I have personally obtained a history, examined the patient, evaluated laboratory and imaging results, formulated the assessment and plan and placed orders.  CRITICAL CARE: The patient is critically ill with multiple organ systems failure and requires high complexity decision making for assessment and support, frequent evaluation and titration of therapies, application of advanced monitoring technologies and extensive interpretation of multiple databases. Critical Care Time devoted to patient care services  described in this note is 125 minutes.   Jamie KatoAaron Lexis Potenza, MD Pulmonary and Critical Care Medicine [facility] Pager: [phone]   02/14/2014, 5:25 AM

## 2014-03-06 NOTE — Progress Notes (Signed)
Soon after 4 am, nurse technician called for help as she was bathing patient.  Upon arrival patient's heart rate was in the 40's and appeared in overall distress.  Code blue called immediately and CPR initiated.  Son, [name], called and in route to hospital.

## 2014-03-06 NOTE — H&P (Signed)
PULMONARY / CRITICAL CARE MEDICINE HISTORY AND PHYSICAL EXAMINATION   Name: [name] MRN: [id number] DOB: 11/09/1934    ADMISSION DATE:  02/23/2014  PRIMARY SERVICE: PCCM  CHIEF COMPLAINT:  Cardiac Arrest  BRIEF PATIENT DESCRIPTION: 80 y/o woman w/ MMP, POD #3 from LLE AKA c/b dilerium and AF w/ RVR, who developed bradycardia and then cardiac arrest.  SIGNIFICANT EVENTS / STUDIES:  CHF exacerbation - 11/23 Delirium w/ hypercarbia - 12/3 Left AKA - 02/20/2014   LINES / TUBES: 8 mm ETT - 02/19/2014 PIV x3  CULTURES: Blood 02/13/2014 - (not yet drawn) Urine 02/06/2014 - (not yet collected)   ANTIBIOTICS: Vanc - 02/23/2014 -> Zoysn - - 02/03/2014 ->  SUBJECTIVE: no pressors, remains vented  VITAL SIGNS: Temp:  [96.2 F (35.7 C)-97.5 F (36.4 C)] 97.5 F (36.4 C) (12/14 0851) Pulse Rate:  [60-76] 73 (12/14 1000) Resp:  [12-25] 24 (12/14 1000) BP: (122-172)/(44-69) 172/51 mmHg (12/14 1000) SpO2:  [93 %-100 %] 100 % (12/14 1000) FiO2 (%):  [40 %-60 %] 40 % (12/14 0800) Weight:  [73.3 kg (161 lb 9.6 oz)] 73.3 kg (161 lb 9.6 oz) (12/14 0500) HEMODYNAMICS:   VENTILATOR SETTINGS: Vent Mode:  [-] PRVC FiO2 (%):  [40 %-60 %] 40 % Set Rate:  [20 bmp-24 bmp] 24 bmp Vt Set:  [480 mL] 480 mL PEEP:  [5 cmH20] 5 cmH20 Plateau Pressure:  [21 cmH20-22 cmH20] 22 cmH20 INTAKE / OUTPUT: Intake/Output      12/13 0701 - 12/14 0700 12/14 0701 - 12/15 0700   I.V. (mL/kg) 2637.9 (36) 46.5 (0.6)   IV Piggyback 250    Total Intake(mL/kg) 2887.9 (39.4) 46.5 (0.6)   Net +2887.9 +46.5          PHYSICAL EXAMINATION: General:  Elderly woman intubated lying in bed Neuro:  Agitated movements, not following commands. rass -1 to 3 HEENT:  jvd noted Cardiovascular:  s1 s2 RRR Lungs:  ronchi Abdomen:  Soft, non-tender, no r/g Musculoskeletal:  S/p L AKA. Dressed dry Skin:  No obvious rashes anteriorly.   LABS:  CBC  Recent Labs Lab 02/15/2014 0336 02/15/14 0315 02/15/2014 0315  WBC 6.0  7.2 7.2  HGB 7.7* 8.1* 8.2*  HCT 24.6* 25.2* 26.0*  PLT 167 173 198   Coag's  Recent Labs Lab 03/04/2014 0400 02/14/14 0312 02/10/2014 0530  APTT  --   --  78*  INR 1.21 1.18 1.27   BMET  Recent Labs Lab 02/14/14 0312 02/15/14 0315 02/18/2014 0315  NA 131* 128* 129*  K 4.8 4.9 4.4  CL 88* 85* 87*  CO2 25 21 19   BUN 62* 65* 71*  CREATININE 3.66* 4.02* 4.22*  GLUCOSE 93 113* 123*   Electrolytes  Recent Labs Lab 02/10/2014 0336 02/14/14 0312 02/15/14 0315 02/15/2014 0315  CALCIUM 9.0 8.9 9.1 9.1  MG 2.2 2.2  --   --    Sepsis Markers  Recent Labs Lab 02/15/2014 0420 03/03/2014 0625  LATICACIDVEN 2.7* 1.3   ABG  Recent Labs Lab 02/26/2014 0900 02/15/2014 0522  PHART 7.323* 7.236*  PCO2ART 64.6* 45.8*  PO2ART 54.7* 128.0*   Liver Enzymes  Recent Labs Lab 02/13/14 0336 02/14/14 0312 02/25/2014 0315  AST 15 20 16   ALT 11 12 12   ALKPHOS 80 97 103  BILITOT <0.2* 0.2* 0.2*  ALBUMIN 2.3* 2.2* 2.3*   Cardiac Enzymes  Recent Labs Lab 02/11/2014 0420  TROPONINI <0.30   Glucose  Recent Labs Lab 02/15/14 0415 02/15/14 0832 02/15/14 1330 02/15/14 1649  02/15/14 2123 02/18/2014 0459  GLUCAP 141* 122* 139* 124* 162* 132*    Imaging Dg Chest Port 1 View  02/27/2014   CLINICAL DATA:  Respiratory arrest.  Intubation.  EXAM: PORTABLE CHEST - 1 VIEW  COMPARISON:  03-15-13  FINDINGS: New endotracheal tube, tip at the clavicular heads.  Increased hazy appearance of the mid and lower chest, likely layering of pleural effusions. There is underlying the lung opacification. Skin fold noted over the left apex; No definitive pneumothorax. Chronic cardiomegaly. Further evaluation of the heart and mediastinal contours is limited by distortion from rightward rotation.  IMPRESSION: 1. New endotracheal tube in good position. 2. Layering pleural effusions with basilar atelectasis. Cannot exclude superimposed pneumonia or aspiration.   Electronically Signed   By: Tiburcio PeaJonathan  Watts M.D.    On: 02/08/2014 05:03    EKG: Regular rate, cannot assess underlying rhythm due to quality/voltage. CXR: Poor quality; quite rotated. ETT in good position, bilateral pleural effusions.  ASSESSMENT / PLAN:  Principal Problem:   Foot ulcer, left Active Problems:   Essential hypertension   Diabetes mellitus   Warfarin anticoagulation   Chronic diastolic CHF (congestive heart failure)   PAD (peripheral artery disease)   CKD (chronic kidney disease) stage 3, GFR 30-59 ml/min   Acute renal failure superimposed on stage 3 chronic kidney disease   PAF (paroxysmal atrial fibrillation)   Pulmonary HTN   Cardiac arrest   PULMONARY A: COPD w/ chronic CO2 retention Need for mechanical ventilation Bilateral pleural effuions P:   No role thoracentesis, wil not change outcome or weaning pcxr in am  Neg balance as able Rate 30, abg in am  No SBT  CARDIOVASCULAR A: S/p Cardiac Arrest Paroxysmal AF PVD w/ nonhealing ulcer HTN P:   MAP on own wnl pressors would be medically ineffective Tele cpr would be medically ineffective Add hydralzine   RENAL A: CKD III, at risk for severe renal failure Hyponatremia Acute on chronic failure Volume overloaded P:   Lasix 80 tid Chem in am  HD or cvvhd would be futilie  GASTROINTESTINAL A: No active issues P:   Start T F ppi  HEMATOLOGIC A: Normocytic anemia fib P:   Likely due to chronic dz. consider dc hep, limited benefit, risks only  INFECTIOUS A: Possible sepsis due to lower extremity wounds, r/o asp pna P:   12/13 vanc>>> 12/14 zosyn>>>  Obtain sputum Pct algo  ENDOCRINE A: DM2 P:   Glycemic control protocol. cbg , ss1 q4h  NEUROLOGIC A: Sedation for mechanical ventilation. S/p cardiac arrest, anoxic brain injury Delirium P:   Prn fentanyl May ned CT / eeg Follow neurostatus  TODAY'S SUMMARY: continued support, aggressive care , hd, cpr, ventilation is medically ineffective and futile, rate  increase, abg in am, family meeting now  Ccm time 45 min   Mcarthur RossettiDaniel J. Tyson AliasFeinstein, MD, FACP Pgr: [phone] [facility]

## 2014-03-06 NOTE — Progress Notes (Signed)
UR Completed.  [phone]  

## 2014-03-06 DEATH — deceased

## 2014-03-19 ENCOUNTER — Encounter (HOSPITAL_COMMUNITY): Payer: Self-pay | Admitting: Vascular Surgery

## 2014-03-22 NOTE — Discharge Summary (Signed)
[name]:  [name], Kelli               ACCOUNT NO.:  [id number]  MEDICAL RECORD NO.:  [id number]  LOCATION:                                FACILITY:  [facility]  PHYSICIAN:  Nelda Bucksaniel J Feinstein, MD DATE OF BIRTH:  1934/04/19  DATE OF ADMISSION:  03/03/2014 DATE OF DISCHARGE:  02/28/2014                              DISCHARGE SUMMARY   This is a 80 year old woman with multiple medical problems, postop day 3, with left lower extremity AKA complicated by delirium and atrial fibrillation with rapid ventricle response.  She developed bradycardia and then cardiac arrest.  Significant events was CHF exacerbation on November 23, delirium with hypercarbia on December 3, left AKA on December 1.  She had an endotracheal tube, size 8, placed on December 14.  Peripheral IVs.  She was given antibiotics with Zosyn and vancomycin.  She remained without pressors.  Remained ventilated.  Her active problem list included the foot ulcer, the AKA as described above, essential hypertension, diabetes, anticoagulation, peripheral vascular disease, COPD with chronic CO2 retention, bilateral pleural effusions, cardiac arrest as described.  Essentially, the patient had anoxic brain injury and delirium and was not making any significant progress and comfort care was provided and the patient expired.  FINAL DIAGNOSES UPON DEATH: 1. Severe anoxic brain injury.  Status post cardiac arrest. 2. Acute respiratory failure. 3. Peripheral vascular disease.  Status post above knee amputation.     Nelda Bucksaniel J Feinstein, MD     DJF/MEDQ  D:  03/20/2014  T:  03/21/2014  Job:  [id number]

## 2014-04-22 ENCOUNTER — Ambulatory Visit: Payer: Medicare Other | Admitting: Cardiology

## 2015-03-10 IMAGING — DX DG CHEST 2V
2 series · 2 of 2 positions shown · non-contrast
Comparison: Chest radiograph performed 01/27/2014

CLINICAL DATA: Preoperative chest radiograph for amputation above
the knee. Cough and altered mental status. Initial encounter.

EXAM:
CHEST  2 VIEW

[chest lat]
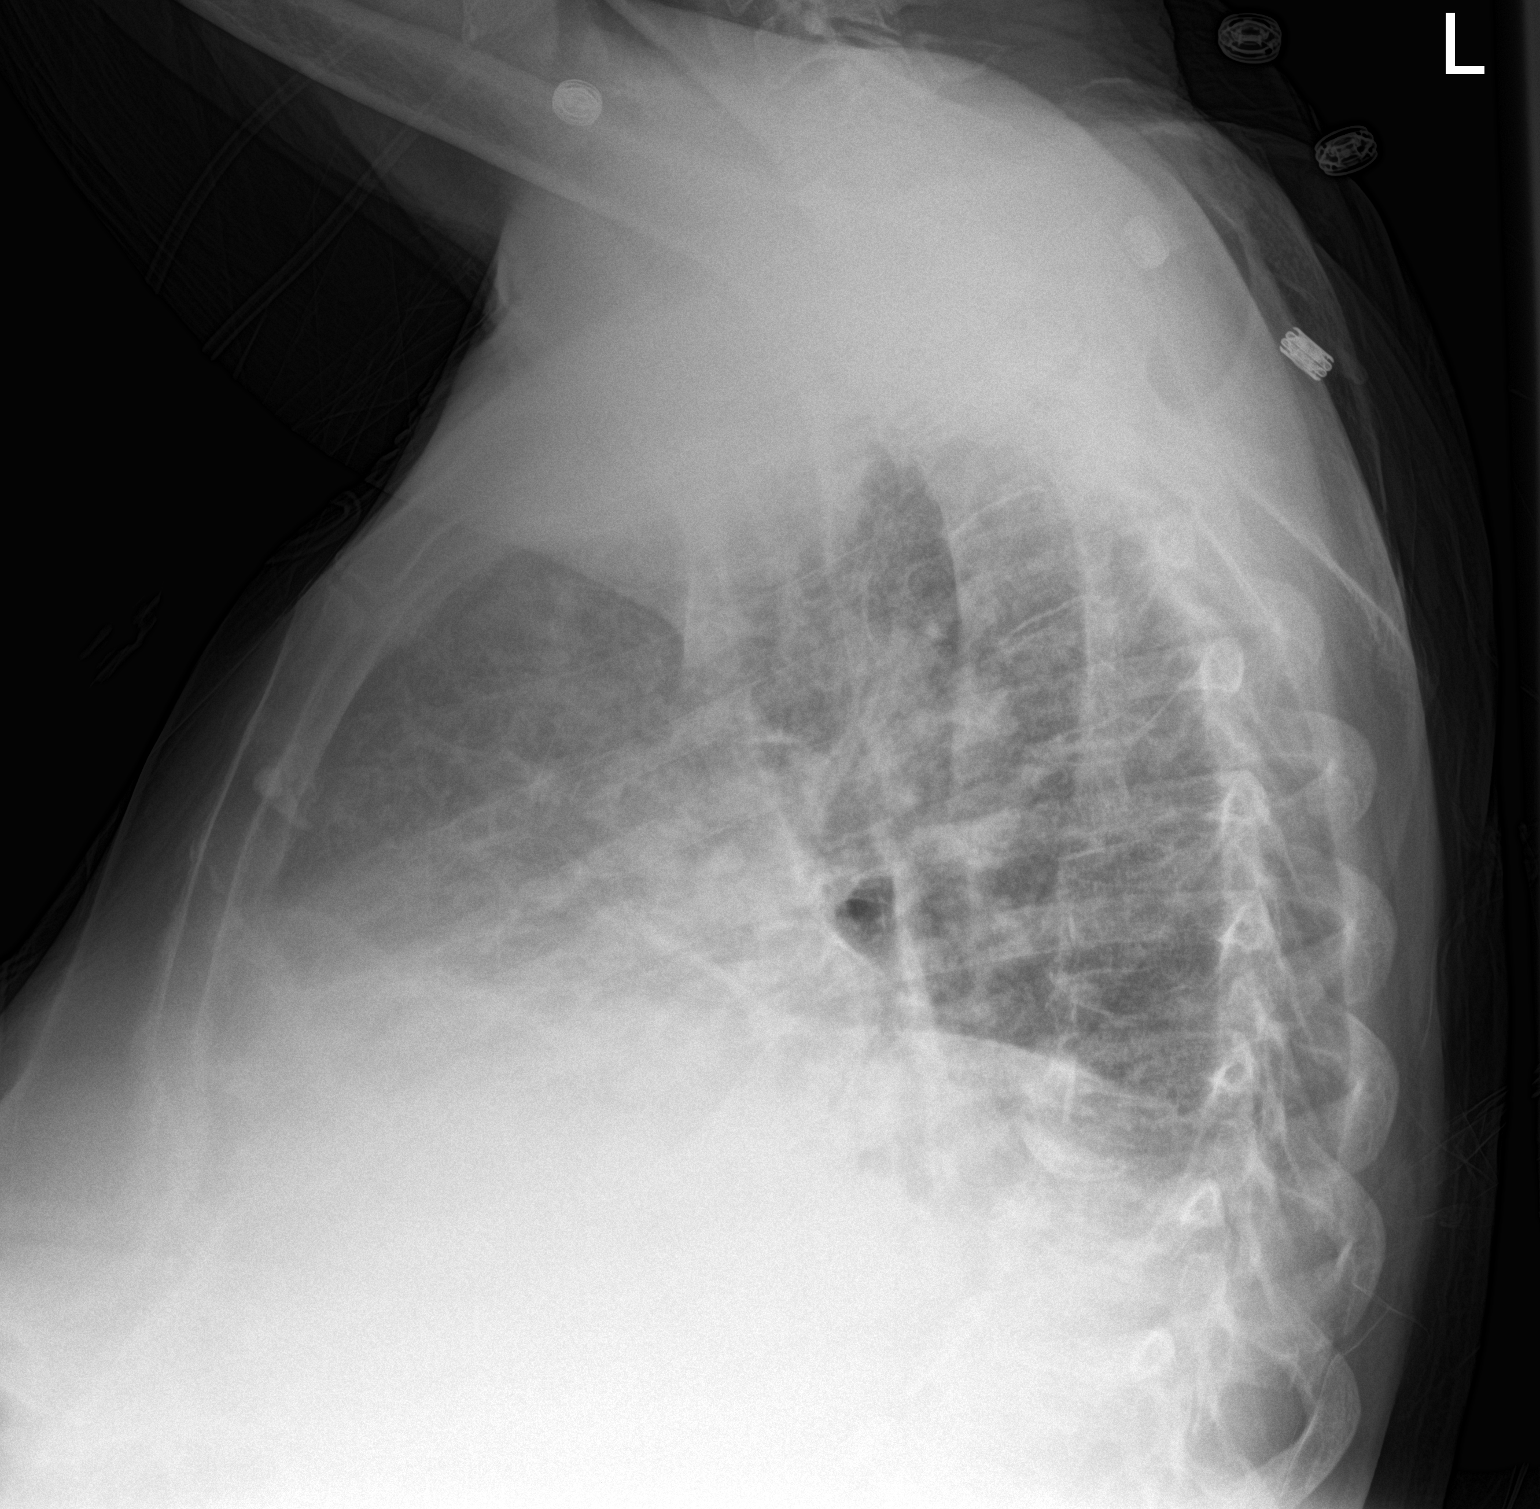

[chest ap]
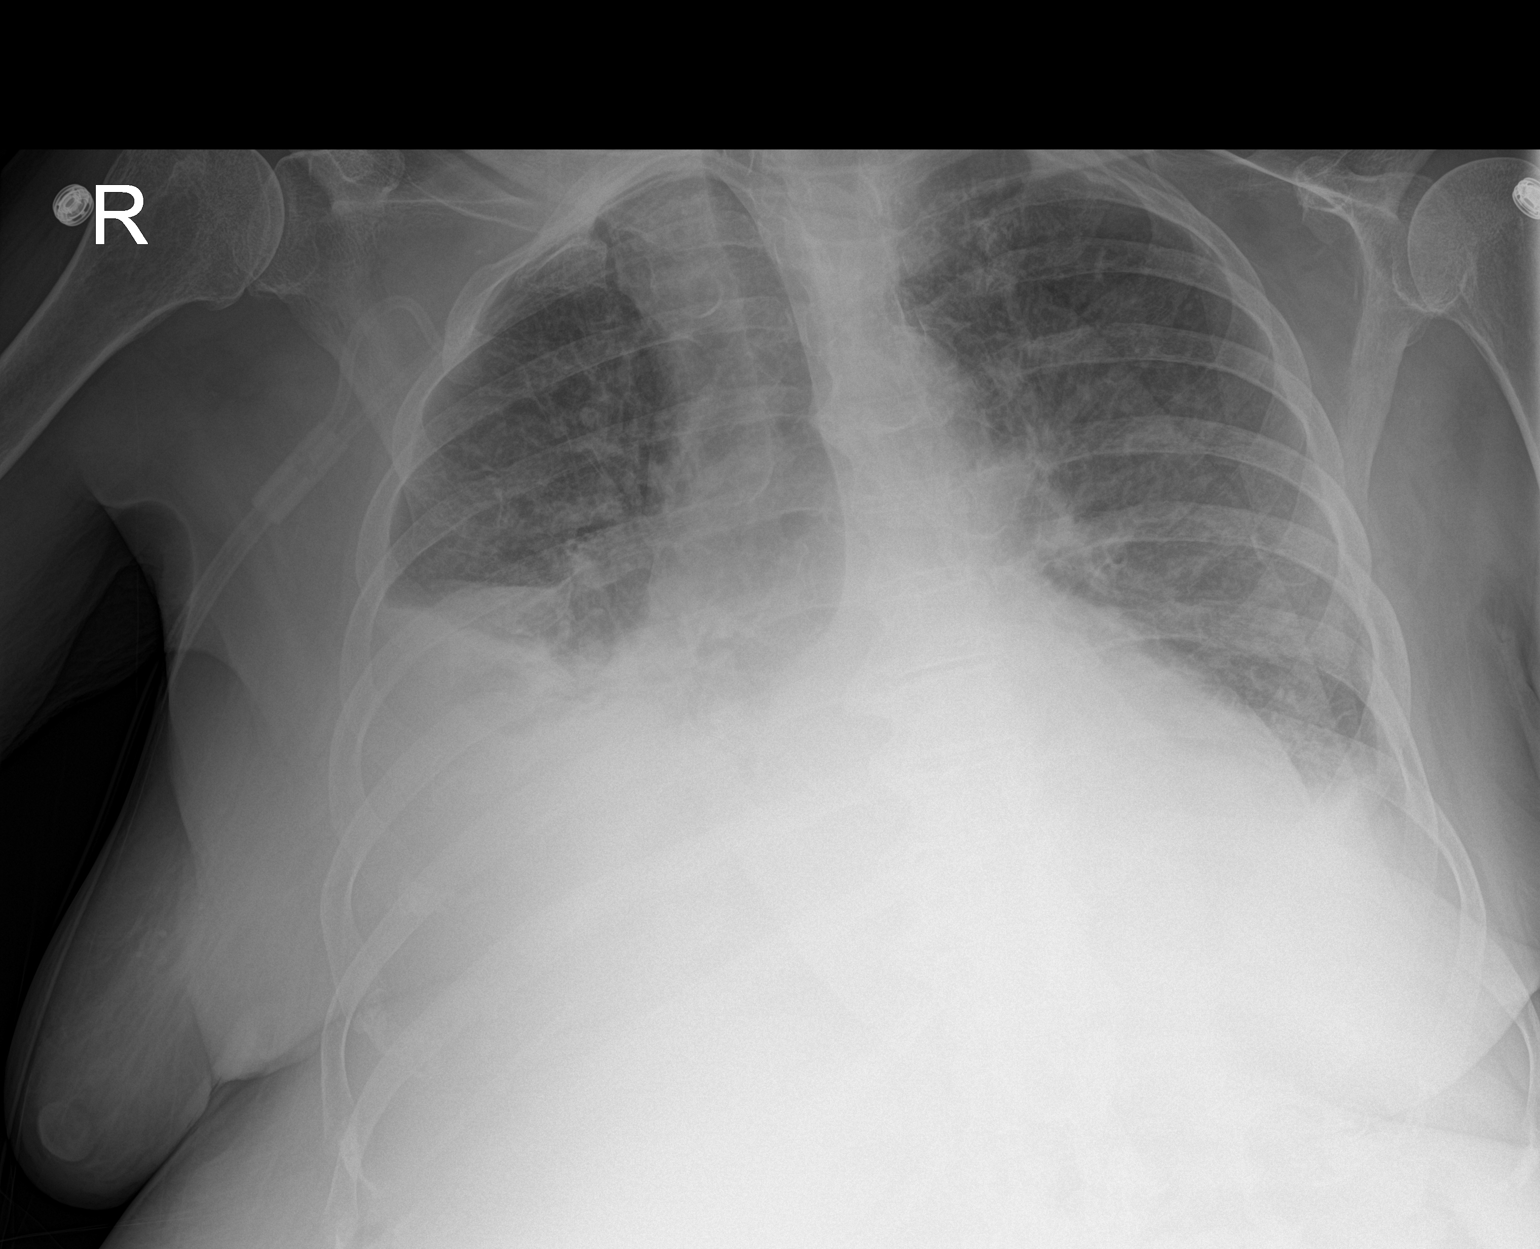

[2 of 2 positions shown; findings below may reference images not displayed]

FINDINGS: The lungs are well-aerated. Moderate right and small left pleural
effusions are seen, with bibasilar airspace opacification, similar
in appearance to the prior study, concerning for persistent mild
pulmonary edema. Underlying vascular congestion is seen. No
pneumothorax is identified.

The heart is enlarged.  No acute osseous abnormalities are seen.
IMPRESSION: Moderate and small left pleural effusions, with bibasilar airspace
opacification, similar in appearance to the prior study and
concerning for persistent mild pulmonary edema. Cardiomegaly noted.
